# Patient Record
Sex: Male | Born: 1950 | Race: White | Hispanic: No | Marital: Married | Smoking: Former smoker
Health system: Southern US, Community
[De-identification: ages and names within clinical notes are randomized; demographics above are authoritative.]

## PROBLEM LIST (undated history)

## (undated) DIAGNOSIS — G4730 Sleep apnea, unspecified: Secondary | ICD-10-CM

## (undated) DIAGNOSIS — E669 Obesity, unspecified: Secondary | ICD-10-CM

## (undated) DIAGNOSIS — G459 Transient cerebral ischemic attack, unspecified: Secondary | ICD-10-CM

## (undated) DIAGNOSIS — M1990 Unspecified osteoarthritis, unspecified site: Secondary | ICD-10-CM

## (undated) DIAGNOSIS — Z9289 Personal history of other medical treatment: Secondary | ICD-10-CM

## (undated) DIAGNOSIS — I2510 Atherosclerotic heart disease of native coronary artery without angina pectoris: Secondary | ICD-10-CM

## (undated) DIAGNOSIS — F419 Anxiety disorder, unspecified: Secondary | ICD-10-CM

## (undated) DIAGNOSIS — E785 Hyperlipidemia, unspecified: Secondary | ICD-10-CM

## (undated) DIAGNOSIS — I639 Cerebral infarction, unspecified: Secondary | ICD-10-CM

## (undated) DIAGNOSIS — J189 Pneumonia, unspecified organism: Secondary | ICD-10-CM

## (undated) DIAGNOSIS — I10 Essential (primary) hypertension: Secondary | ICD-10-CM

## (undated) DIAGNOSIS — Z8719 Personal history of other diseases of the digestive system: Secondary | ICD-10-CM

## (undated) DIAGNOSIS — K219 Gastro-esophageal reflux disease without esophagitis: Secondary | ICD-10-CM

## (undated) HISTORY — PX: FOOT SURGERY: SHX648

## (undated) HISTORY — DX: Gastro-esophageal reflux disease without esophagitis: K21.9

## (undated) HISTORY — PX: OTHER SURGICAL HISTORY: SHX169

## (undated) HISTORY — DX: Sleep apnea, unspecified: G47.30

## (undated) HISTORY — DX: Obesity, unspecified: E66.9

## (undated) HISTORY — DX: Cerebral infarction, unspecified: I63.9

## (undated) HISTORY — PX: BUNIONECTOMY: SHX129

## (undated) HISTORY — PX: TONSILLECTOMY: SUR1361

## (undated) HISTORY — DX: Hyperlipidemia, unspecified: E78.5

## (undated) HISTORY — DX: Transient cerebral ischemic attack, unspecified: G45.9

## (undated) HISTORY — DX: Essential (primary) hypertension: I10

## (undated) HISTORY — DX: Personal history of other medical treatment: Z92.89

## (undated) HISTORY — PX: KNEE SURGERY: SHX244

---

## 2004-02-09 ENCOUNTER — Ambulatory Visit (HOSPITAL_COMMUNITY): Admission: RE | Admit: 2004-02-09 | Discharge: 2004-02-09 | Payer: Self-pay | Admitting: Family Medicine

## 2004-02-21 ENCOUNTER — Ambulatory Visit (HOSPITAL_COMMUNITY): Admission: RE | Admit: 2004-02-21 | Discharge: 2004-02-21 | Payer: Self-pay | Admitting: Family Medicine

## 2005-05-17 ENCOUNTER — Ambulatory Visit (HOSPITAL_COMMUNITY): Admission: RE | Admit: 2005-05-17 | Discharge: 2005-05-17 | Payer: Self-pay | Admitting: Family Medicine

## 2008-04-20 LAB — TSH: TSH: 2.42 u[IU]/mL (ref <=5.90)

## 2008-12-02 DIAGNOSIS — I639 Cerebral infarction, unspecified: Secondary | ICD-10-CM

## 2008-12-02 DIAGNOSIS — Z8673 Personal history of transient ischemic attack (TIA), and cerebral infarction without residual deficits: Secondary | ICD-10-CM | POA: Insufficient documentation

## 2008-12-02 HISTORY — DX: Cerebral infarction, unspecified: I63.9

## 2009-04-18 LAB — LIPID PANEL
Cholesterol: 186 mg/dL (ref 0–200)
HDL: 36 mg/dL (ref 35–70)
LDL Cholesterol: 99 mg/dL
Triglycerides: 252 mg/dL — AB (ref 40–160)

## 2009-12-02 HISTORY — PX: COLONOSCOPY: SHX174

## 2010-01-23 ENCOUNTER — Encounter: Admission: RE | Admit: 2010-01-23 | Discharge: 2010-01-23 | Payer: Self-pay | Admitting: Internal Medicine

## 2010-04-04 ENCOUNTER — Encounter (INDEPENDENT_AMBULATORY_CARE_PROVIDER_SITE_OTHER): Payer: Self-pay

## 2010-04-06 ENCOUNTER — Ambulatory Visit: Payer: Self-pay | Admitting: Internal Medicine

## 2010-04-20 ENCOUNTER — Ambulatory Visit: Payer: Self-pay | Admitting: Internal Medicine

## 2010-04-24 ENCOUNTER — Encounter: Payer: Self-pay | Admitting: Internal Medicine

## 2010-08-24 ENCOUNTER — Ambulatory Visit (HOSPITAL_BASED_OUTPATIENT_CLINIC_OR_DEPARTMENT_OTHER): Admission: RE | Admit: 2010-08-24 | Discharge: 2010-08-24 | Payer: Self-pay | Admitting: Urology

## 2010-09-27 ENCOUNTER — Ambulatory Visit: Payer: Self-pay | Admitting: Cardiovascular Disease

## 2010-10-10 ENCOUNTER — Telehealth (INDEPENDENT_AMBULATORY_CARE_PROVIDER_SITE_OTHER): Payer: Self-pay | Admitting: *Deleted

## 2010-10-11 ENCOUNTER — Encounter: Payer: Self-pay | Admitting: Cardiology

## 2010-10-11 ENCOUNTER — Ambulatory Visit (HOSPITAL_COMMUNITY): Admission: RE | Admit: 2010-10-11 | Discharge: 2010-10-11 | Payer: Self-pay | Admitting: Cardiovascular Disease

## 2010-10-11 ENCOUNTER — Ambulatory Visit: Payer: Self-pay

## 2010-10-11 ENCOUNTER — Encounter: Payer: Self-pay | Admitting: Cardiovascular Disease

## 2010-10-11 ENCOUNTER — Ambulatory Visit: Payer: Self-pay | Admitting: Cardiology

## 2010-10-11 ENCOUNTER — Encounter (HOSPITAL_COMMUNITY)
Admission: RE | Admit: 2010-10-11 | Discharge: 2010-12-01 | Payer: Self-pay | Source: Home / Self Care | Attending: Cardiovascular Disease | Admitting: Cardiovascular Disease

## 2010-10-15 ENCOUNTER — Ambulatory Visit: Payer: Self-pay

## 2010-12-19 ENCOUNTER — Encounter: Payer: Self-pay | Admitting: Cardiovascular Disease

## 2010-12-19 DIAGNOSIS — E669 Obesity, unspecified: Secondary | ICD-10-CM | POA: Insufficient documentation

## 2010-12-19 DIAGNOSIS — I10 Essential (primary) hypertension: Secondary | ICD-10-CM | POA: Insufficient documentation

## 2010-12-19 DIAGNOSIS — E785 Hyperlipidemia, unspecified: Secondary | ICD-10-CM | POA: Insufficient documentation

## 2010-12-19 DIAGNOSIS — K219 Gastro-esophageal reflux disease without esophagitis: Secondary | ICD-10-CM | POA: Insufficient documentation

## 2010-12-19 DIAGNOSIS — G4733 Obstructive sleep apnea (adult) (pediatric): Secondary | ICD-10-CM | POA: Insufficient documentation

## 2010-12-19 DIAGNOSIS — E119 Type 2 diabetes mellitus without complications: Secondary | ICD-10-CM | POA: Insufficient documentation

## 2010-12-23 ENCOUNTER — Encounter: Payer: Self-pay | Admitting: Family Medicine

## 2011-01-01 NOTE — Letter (Signed)
Summary: [facility] Instructions  [facility]  [address] [place], [place] [address]   Phone: [phone]  Fax: [phone]       Matthew Love    May 16, 1951    MRN: [id number]        Procedure Day Dorna Bloom:  Farrell Ours  04/20/10     Arrival Time:  7:30AM     Procedure Time:  8:30AM     Location of Procedure:                    _X _  [facility] (4th Floor)                        PREPARATION FOR COLONOSCOPY WITH MOVIPREP   Starting 5 days prior to your procedure 04/15/10 do not eat nuts, seeds, popcorn, corn, beans, peas,  salads, or any raw vegetables.  Do not take any fiber supplements (e.g. Metamucil, Citrucel, and Benefiber).  THE DAY BEFORE YOUR PROCEDURE         DATE: 04/19/10  DAY: THURSDAY  1.  Drink clear liquids the entire day-NO SOLID FOOD  2.  Do not drink anything colored red or purple.  Avoid juices with pulp.  No orange juice.  3.  Drink at least 64 oz. (8 glasses) of fluid/clear liquids during the day to prevent dehydration and help the prep work efficiently.  CLEAR LIQUIDS INCLUDE: Water Jello Ice Popsicles Tea (sugar ok, no milk/cream) Powdered fruit flavored drinks Coffee (sugar ok, no milk/cream) Gatorade Juice: apple, white grape, white cranberry  Lemonade Clear bullion, consomm, broth Carbonated beverages (any kind) Strained chicken noodle soup Hard Candy                             4.  In the morning, mix first dose of MoviPrep solution:    Empty 1 Pouch A and 1 Pouch B into the disposable container    Add lukewarm drinking water to the top line of the container. Mix to dissolve    Refrigerate (mixed solution should be used within 24 hrs)  5.  Begin drinking the prep at 5:00 p.m. The MoviPrep container is divided by 4 marks.   Every 15 minutes drink the solution down to the next mark (approximately 8 oz) until the full liter is complete.   6.  Follow completed prep with 16 oz of clear liquid of your choice  (Nothing red or purple).  Continue to drink clear liquids until bedtime.  7.  Before going to bed, mix second dose of MoviPrep solution:    Empty 1 Pouch A and 1 Pouch B into the disposable container    Add lukewarm drinking water to the top line of the container. Mix to dissolve    Refrigerate  THE DAY OF YOUR PROCEDURE      DATE: 04/20/10  DAY: FRIDAY  Beginning at 3:30AM (5 hours before procedure):         1. Every 15 minutes, drink the solution down to the next mark (approx 8 oz) until the full liter is complete.  2. Follow completed prep with 16 oz. of clear liquid of your choice.    3. You may drink clear liquids until 6:30AM (2 HOURS BEFORE PROCEDURE).   MEDICATION INSTRUCTIONS  Unless otherwise instructed, you should take regular prescription medications with a small sip of water   as early as possible the morning  of your procedure.   Additional medication instructions: Do not take diuretic am of procedure         OTHER INSTRUCTIONS  You will need a responsible adult at least 61 years of age to accompany you and drive you home.   This person must remain in the waiting room during your procedure.  Wear loose fitting clothing that is easily removed.  Leave jewelry and other valuables at home.  However, you may wish to bring a book to read or  an iPod/MP3 player to listen to music as you wait for your procedure to start.  Remove all body piercing jewelry and leave at home.  Total time from sign-in until discharge is approximately 2-3 hours.  You should go home directly after your procedure and rest.  You can resume normal activities the  day after your procedure.  The day of your procedure you should not:   Drive   Make legal decisions   Operate machinery   Drink alcohol   Return to work  You will receive specific instructions about eating, activities and medications before you leave.    The above instructions have been reviewed and explained to  me by   Ulis Rias RN  Apr 06, 2010 8:48 AM     I fully understand and can verbalize these instructions _____________________________ Date _________

## 2011-01-01 NOTE — Miscellaneous (Signed)
Summary: Lec previsit  Clinical Lists Changes  Medications: Added new medication of MOVIPREP 100 GM  SOLR (PEG-KCL-NACL-NASULF-NA ASC-C) As per prep instructions. - Signed Rx of MOVIPREP 100 GM  SOLR (PEG-KCL-NACL-NASULF-NA ASC-C) As per prep instructions.;  #1 x 0;  Signed;  Entered by: Ulis Rias RN;  Authorized by: Hilarie Fredrickson MD;  Method used: Electronically to [facility] [address], [place], [place], [place]  [address], Ph: [phone], Fax: [phone] Allergies: Added new allergy or adverse reaction of VICODIN Observations: Added new observation of NKA: F (04/06/2010 8:27)    Prescriptions: MOVIPREP 100 GM  SOLR (PEG-KCL-NACL-NASULF-NA ASC-C) As per prep instructions.  #1 x 0   Entered by:   Ulis Rias RN   Authorized by:   Hilarie Fredrickson MD   Signed by:   Ulis Rias RN on 04/06/2010   Method used:   Electronically to        [facility]  [address] (retail)       [address]       [place], [place]  [address]       Ph: [phone]       Fax: [phone]   RxID:   [id number]

## 2011-01-01 NOTE — Progress Notes (Signed)
Summary: Nuc Pre-Procedure  Phone Note Outgoing Call Call back at [facility] Phone [phone]   Call placed by: Antionette Char RN,  October 10, 2010 3:22 PM Call placed to: Patient Reason for Call: Confirm/change Appt Summary of Call: Reviewed information on Myoview Information Sheet (see scanned document for further details).  Spoke with patient.     Nuclear Med Background Indications for Stress Test: Evaluation for Ischemia   History: Echo, Myocardial Perfusion Study  History Comments: 2004 MPS- NML  Symptoms: Fatigue, SOB    Nuclear Pre-Procedure Cardiac Risk Factors: History of Smoking, Hypertension, NIDDM

## 2011-01-01 NOTE — Assessment & Plan Note (Signed)
Summary: Cardiology Nuclear Testing  Nuclear Med Background Indications for Stress Test: Evaluation for Ischemia   History: Echo, Myocardial Perfusion Study  History Comments: 2004 MPS- NML '08 Echo: NL EF.  Symptoms: DOE, Fatigue, Fatigue with Exertion, SOB    Nuclear Pre-Procedure Cardiac Risk Factors: History of Smoking, Hypertension, Lipids, NIDDM, Obesity, TIA Caffeine/Decaff Intake: None NPO After: 10:00 PM IV 0.9% NS with Angio Cath: 20g     IV Site: R Hand IV Started by: Irean Hong, RN Chest Size (in) 54     Height (in): 72.5 Weight (lb): 295 BMI: 39.60  Nuclear Med Study 1 or 2 day study:  2 day     Stress Test Type:  Stress Reading MD:  Olga Millers, MD     Referring MD:  Kristeen Miss Resting Radionuclide:  Technetium 15m Tetrofosmin     Resting Radionuclide Dose:  33 mCi  Stress Radionuclide:  Technetium 69m Tetrofosmin     Stress Radionuclide Dose:  33 mCi   Stress Protocol Exercise Time (min):  7:15 min     Max HR:  153 bpm     Predicted Max HR:  161 bpm  Max Systolic BP: 228 mm Hg     Percent Max HR:  95.03 %     METS: 8.8 Rate Pressure Product:  [address]    Stress Test Technologist:  Irean Hong,  RN     Nuclear Technologist:  Domenic Polite, CNMT  Rest Procedure  Myocardial perfusion imaging was performed at rest 45 minutes following the intravenous administration of Technetium 31m Tetrofosmin.  Stress Procedure  The patient exercised for 7 minutes and 15 seconds, RPE=15.   The patient stopped due to DOE  and denied any chest pain.  There were no significant ST-T wave changes, frequent PVC's, and  7 beats PSVT immediately in recovery. The patient had a hypertensive response to exercise.  Technetium 22m Tetrofosmin was injected at peak exercise and myocardial perfusion imaging was performed after a brief delay.  QPS Raw Data Images:  Mild diaphragmatic attenuation.  Normal left ventricular size. Stress Images:  Decreased radiotracer counts in the  inferior wall.  Rest Images:  Decreased radiotracer counts in the inferior wall.  Subtraction (SDS):  Fixed inferior perfusion defect.  Transient Ischemic Dilatation:  0.93  (Normal <1.22)  Lung/Heart Ratio:  0.23  (Normal <0.45)  Quantitative Gated Spect Images QGS EDV:  77 ml QGS ESV:  24 ml QGS EF:  69 % QGS cine images:  Normal wall motion.    Overall Impression  Exercise Capacity: Fair exercise capacity. BP Response: Hypertensive blood pressure response. Clinical Symptoms: Short of breath, no chest pain.  ECG Impression: No significant ST segment change suggestive of ischemia. Short run SVT during exercise.  Overall Impression: Fixed inferior perfusion defect with no evidence for ischemia.  Low risk study.  Overall Impression Comments: There is significant soft tissue attenuation on planar images.  Suspect inferior fixed defect is due to diaphragmatic attenuation given normal wall motion.   Appended Document: Cardiology Nuclear Testing copy sent to Dr.Nasher

## 2011-01-01 NOTE — Procedures (Signed)
Summary: Colonoscopy  Patient: Matthew Love Note: All result statuses are Final unless otherwise noted.  Tests: (1) Colonoscopy (COL)   COL Colonoscopy           DONE     [facility]     [address]     [place], [place]  [address]           COLONOSCOPY PROCEDURE REPORT           PATIENT:  [name], [name]  MR#:  [id number]     BIRTHDATE:  04-May-1951, 58 yrs. old  GENDER:  male     ENDOSCOPIST:  Wilhemina Bonito. Eda Keys, MD     REF. BY:  Screening / Recall     PROCEDURE DATE:  04/20/2010     PROCEDURE:  Colonoscopy with snare polypectomy     ASA CLASS:  Class II     INDICATIONS:  Routine Risk Screening ;negative exam 2004     MEDICATIONS:   Fentanyl 75 mcg IV, Versed 8 mg IV           DESCRIPTION OF PROCEDURE:   After the risks benefits and     alternatives of the procedure were thoroughly explained, informed     consent was obtained.  Digital rectal exam was performed and     revealed no abnormalities.   The [id number] [id number] endoscope was     introduced through the anus and advanced to the cecum, which was     identified by both the appendix and ileocecal valve, without     limitations.Time to cecum = 2:40 min.The quality of the prep was     good, using MoviPrep.  The instrument was then slowly withdrawn     (time = 13:20 min) as the colon was fully examined.     <<PROCEDUREIMAGES>>           FINDINGS:  A 6mm sessile polyp was found in the cecum. Polyp was     snared without cautery. Retrieval was successful.  Moderate     diverticulosis was found in the sigmoid colon.  This was otherwise     a normal examination of the colon.   Retroflexed views in the     rectum revealed internal hemorrhoids.    The scope was then     withdrawn from the patient and the procedure completed.           COMPLICATIONS:  None     ENDOSCOPIC IMPRESSION:     1) Sessile polyp in the cecum - removed     2) Moderate diverticulosis in the sigmoid colon     3) Otherwise normal examination     4)  Internal hemorrhoids           RECOMMENDATIONS:     1) Follow up colonoscopy in 5 years           ______________________________     Wilhemina Bonito. Eda Keys, MD           CC:  Geoffry Paradise, MD; The Patient           n.     eSIGNED:   Wilhemina Bonito. Eda Keys at 04/20/2010 09:21 AM           Malena Catholic, [id number]  Note: An exclamation mark (!) indicates a result that was not dispersed into the flowsheet. Document Creation Date: 04/20/2010 1:52 PM _______________________________________________________________________  (1) Order result status: Final Collection or observation date-time: 04/20/2010 09:13 Requested date-time:  Receipt date-time:  Reported date-time:  Referring Physician:   Ordering Physician: Fransico Setters [phone]) Specimen Source:  Source: Launa Grill Order Number: [id number] Lab site:   Appended Document: Colonoscopy recall     Procedures Next Due Date:    Colonoscopy: 04/2015

## 2011-01-01 NOTE — Letter (Signed)
Summary: Patient Notice- Polyp Results  [facility]  [address] [place], [place] [address]   Phone: [phone]  Fax: [phone]        Apr 24, 2010 MRN: [id number]    TEE RICHESON [address] [place], [place]  [address]    Dear Mr. BRINE,  I am pleased to inform you that the colon polyp(s) removed during your recent colonoscopy was (were) found to be benign (no cancer detected) upon pathologic examination.  I recommend you have a repeat colonoscopy examination in 5 years to look for recurrent polyps, as having colon polyps increases your risk for having recurrent polyps or even colon cancer in the future.  Should you develop new or worsening symptoms of abdominal pain, bowel habit changes or bleeding from the rectum or bowels, please schedule an evaluation with either your primary care physician or with me.  Additional information/recommendations:  __ No further action with gastroenterology is needed at this time. Please      follow-up with your primary care physician for your other healthcare      needs.   Please call us if you are having persistent problems or have questions about your condition that have not been fully answered at this time.  Sincerely,  Hilarie Fredrickson MD  This letter has been electronically signed by your physician.  Appended Document: Patient Notice- Polyp Results letter mailed.

## 2011-01-09 ENCOUNTER — Ambulatory Visit: Payer: Self-pay | Admitting: Cardiovascular Disease

## 2011-02-14 LAB — POCT I-STAT 4, (NA,K, GLUC, HGB,HCT)
Potassium: 3.6 mEq/L (ref 3.5–5.1)
Sodium: 143 mEq/L (ref 135–145)

## 2011-04-05 ENCOUNTER — Encounter: Payer: Self-pay | Admitting: Cardiovascular Disease

## 2011-04-24 ENCOUNTER — Telehealth: Payer: Self-pay | Admitting: *Deleted

## 2011-04-24 NOTE — Telephone Encounter (Signed)
Try Losartan 100 mg daily.  He will need to work on diet , exercise and weight loss.  Losartan is a weaker BP medication.  Check BMP in 3-4 weeks after changing

## 2011-04-24 NOTE — Telephone Encounter (Signed)
Wife called back with name of new drug possibility, they will call back if they want to change. i called [facility] and it would depend on the pts ins on wether or not it would be cheaper. Wife given prices and med names and will see if Adriel will change med, pt to call back if wants to change.Alfonso Ramus RN

## 2011-04-24 NOTE — Telephone Encounter (Signed)
diovan is costly, can you suggest something comparable but less costly. i will phone pt back, Alfonso Ramus RN

## 2011-09-25 ENCOUNTER — Other Ambulatory Visit: Payer: Self-pay | Admitting: Cardiovascular Disease

## 2011-09-25 MED ORDER — TRIAMTERENE-HCTZ 37.5-25 MG PO TABS: 1.0000 | ORAL_TABLET | Freq: Every day | ORAL | Status: DC

## 2011-09-25 NOTE — Telephone Encounter (Signed)
Fax Received. Refill Completed. Matthew Love (M.A). Pt needs appointment then refill can be made  

## 2011-10-21 ENCOUNTER — Other Ambulatory Visit: Payer: Self-pay | Admitting: Cardiovascular Disease

## 2012-05-15 ENCOUNTER — Ambulatory Visit: Payer: Self-pay | Admitting: Nurse Practitioner

## 2012-07-28 ENCOUNTER — Ambulatory Visit (INDEPENDENT_AMBULATORY_CARE_PROVIDER_SITE_OTHER): Payer: PRIVATE HEALTH INSURANCE | Admitting: Physician Assistant

## 2012-07-28 ENCOUNTER — Ambulatory Visit (INDEPENDENT_AMBULATORY_CARE_PROVIDER_SITE_OTHER)
Admission: RE | Admit: 2012-07-28 | Discharge: 2012-07-28 | Disposition: A | Discharge status: Home / Self Care | Payer: PRIVATE HEALTH INSURANCE | Source: Ambulatory Visit | Attending: Physician Assistant | Admitting: Physician Assistant

## 2012-07-28 ENCOUNTER — Encounter: Payer: Self-pay | Admitting: Physician Assistant

## 2012-07-28 VITALS — BP 157/87 | HR 67 | Resp 18 | Ht 74.0 in | Wt 291.0 lb

## 2012-07-28 DIAGNOSIS — R079 Chest pain, unspecified: Secondary | ICD-10-CM

## 2012-07-28 DIAGNOSIS — I10 Essential (primary) hypertension: Secondary | ICD-10-CM

## 2012-07-28 DIAGNOSIS — K219 Gastro-esophageal reflux disease without esophagitis: Secondary | ICD-10-CM

## 2012-07-28 NOTE — Progress Notes (Signed)
[address] [place], [place]  [address] Phone: [phone] Fax:  [phone]  Date:  07/28/2012   Name:  Matthew Love   DOB:  [birth year]   MRN:  [id number]  PCP:  Minda Meo, MD  Primary Cardiologist:  Dr. Delane Ginger  Primary Electrophysiologist:  None    History of Present Illness: Matthew Love is a 61 y.o. male who returns for evaluation of chest pain.  He has a history of HTN, DM2, HL, GERD and sleep apnea. He is noncompliant with CPAP.   Echo 11/11: EF 60-65%, grade 1 diastolic dysfunction, mild LAE.  ETT-Myoview 11/11: Inferior fixed defect likely due to diaphragmatic attenuation with normal wall motion, EF 69%.   Over the last 2-3 months, he has noted worsening left-sided chest pain. He can point to it with one finger. It feels dull. He denies any exertional chest pain. He notes it only at rest. He denies any radiating symptoms or associated nausea, diaphoresis. He denies associated shortness of breath. He does have dyspnea with more extreme activities. He denies orthopnea, PND or significant pedal edema. He has had a cough over last several months. This is productive of clear sputum. He was prescribed a Z-Pak at one point with his primary care provider. He denies any recent travels, injuries to his lower extremities or recent hospitalizations.  He denies pleuritic chest pain or chest pain with lying supine.  Wt Readings from Last 3 Encounters:  07/28/12 291 lb (131.997 kg)  10/11/10 295 lb (133.811 kg)     Past Medical History  Diagnosis Date  . Hypertension   . GERD (gastroesophageal reflux disease)   . Diabetes mellitus   . Sleep apnea   . Obesity   . Hyperlipidemia     Current Outpatient Prescriptions  Medication Sig Dispense Refill  . aspirin 81 MG tablet Take 81 mg by mouth daily.        Randa Ngo 30 MG/ACT SOLN 2 applicators as directed.       [name] DIOVAN 160 MG tablet TAKE ONE TABLET BY MOUTH EVERY DAY **NEED TO  SCHEDULE MD APPOINTMENT FOR MORE REFILLS**  15 each  0  . esomeprazole (NEXIUM) 40 MG capsule Take 40 mg by mouth daily.        . fexofenadine (ALLEGRA) 180 MG tablet Take 180 mg by mouth as needed.        . Multiple Vitamin (MULTIVITAMIN) tablet Take 1 tablet by mouth daily.        . OMEGA 3 1200 MG CAPS Take by mouth 4 (four) times daily.        [name] triamterene-hydrochlorothiazide (MAXZIDE-25) 37.5-25 MG per tablet Take 1 each (1 tablet total) by mouth daily.  30 tablet  0  . Vardenafil HCl (LEVITRA PO) Take by mouth as needed.          Allergies: Allergies  Allergen Reactions  . Hydrocodone-Acetaminophen     REACTION: rash    History  Substance Use Topics  . Smoking status: Former Smoker -- 3.5 packs/day for 20 years  . Smokeless tobacco: Not on file  . Alcohol Use: No     ROS:  Please see the history of present illness.     All other systems reviewed and negative.   PHYSICAL EXAM: VS:  BP 157/87  Pulse 67  Resp 18  Ht 6\' 2"  (1.88 m)  Wt 291 lb (131.997 kg)  BMI 37.36 kg/m2  SpO2 97% Repeat blood pressure by  me 120/80  Well nourished, well developed, in no acute distress HEENT: normal Neck: no JVD Vascular: No carotid bruits Endocrine: No thyromegaly Cardiac:  normal S1, S2; RRR; no murmur Lungs:  clear to auscultation bilaterally, no wheezing, rhonchi or rales Abd: soft, nontender, no hepatomegaly Ext: no edema Skin: warm and dry Neuro:  CNs 2-12 intact, no focal abnormalities noted  EKG:  Sinus rhythm, heart rate 68, left axis, nonspecific ST-T wave changes, no change from prior tracings      ASSESSMENT AND PLAN:  1. Chest Pain Atypical symptoms. He does have significant cardiac risk factors. It has been more than 18 months since his last assessment for ischemia. I will set him up for an ETT-Myoview. He is a prior smoker and has a recent cough. I will also check a chest x-ray. He already has an appointment next month with Dr. Elease Hashimoto. He can keep that  appointment.  2. Hypertension Controlled.  Continue current therapy.   3. GERD I have asked him to take his Nexium twice a day for a week to see if this helps his symptoms.  Signed, Tereso Newcomer, PA-C  1:34 PM 07/28/2012

## 2012-07-28 NOTE — Patient Instructions (Signed)
Take your Nexium twice a day (30 minutes before a meal) for one week. Keep follow up with Dr. Delane Ginger on 08/18/2012 as planned.

## 2012-07-29 ENCOUNTER — Telehealth: Payer: Self-pay | Admitting: *Deleted

## 2012-07-29 NOTE — Telephone Encounter (Signed)
Message copied by Tarri Fuller on Wed Jul 29, 2012  4:59 PM ------      Message from: [place], [place] T      Created: Tue Jul 28, 2012  5:08 PM       Nothing acute on chest X-ray      There are changes that may be from prior smoking - not cause of his chest pain.      Fax chest X-ray results to PCP and have Jerrye Bushy follow up with ARONSON,RICHARD A, MD (not urgent).      Tereso Newcomer, PA-C  5:08 PM 07/28/2012

## 2012-07-29 NOTE — Telephone Encounter (Signed)
pt notified about cxr and gave me verbal understanding today, will fax cxr to PCP, pt aware to see PCP about cxr though not urgent, pt said ok

## 2012-08-06 ENCOUNTER — Ambulatory Visit (HOSPITAL_COMMUNITY): Payer: PRIVATE HEALTH INSURANCE | Attending: Cardiology | Admitting: Radiology

## 2012-08-06 VITALS — BP 128/78 | Ht 74.0 in | Wt 291.0 lb

## 2012-08-06 DIAGNOSIS — Z87891 Personal history of nicotine dependence: Secondary | ICD-10-CM | POA: Insufficient documentation

## 2012-08-06 DIAGNOSIS — E669 Obesity, unspecified: Secondary | ICD-10-CM | POA: Insufficient documentation

## 2012-08-06 DIAGNOSIS — E119 Type 2 diabetes mellitus without complications: Secondary | ICD-10-CM | POA: Insufficient documentation

## 2012-08-06 DIAGNOSIS — Z8249 Family history of ischemic heart disease and other diseases of the circulatory system: Secondary | ICD-10-CM | POA: Insufficient documentation

## 2012-08-06 DIAGNOSIS — R0609 Other forms of dyspnea: Secondary | ICD-10-CM | POA: Insufficient documentation

## 2012-08-06 DIAGNOSIS — I10 Essential (primary) hypertension: Secondary | ICD-10-CM | POA: Insufficient documentation

## 2012-08-06 DIAGNOSIS — R0989 Other specified symptoms and signs involving the circulatory and respiratory systems: Secondary | ICD-10-CM | POA: Insufficient documentation

## 2012-08-06 DIAGNOSIS — R079 Chest pain, unspecified: Secondary | ICD-10-CM | POA: Insufficient documentation

## 2012-08-06 DIAGNOSIS — R0602 Shortness of breath: Secondary | ICD-10-CM

## 2012-08-06 MED ORDER — TECHNETIUM TC 99M TETROFOSMIN IV KIT
30.0000 | PACK | Freq: Once | INTRAVENOUS | Status: AC | PRN
  Administered 2012-08-06: 30 via INTRAVENOUS

## 2012-08-06 NOTE — Progress Notes (Signed)
[facility] SITE 3 NUCLEAR MED [address] [place] [address] [phone]  Cardiology Nuclear Med Study  Matthew Love is a 61 y.o. male     MRN : [id number]     DOB: 09-18-1951  Procedure Date: 08/06/2012  Nuclear Med Background Indication for Stress Test:  Evaluation for Ischemia History:  No prior known history of CAD, 10-2010 Myocardial Perfusion Study-(-) ischemia, EF=89%, and 10-2010 Echo: EF=60-65% Cardiac Risk Factors: Family History - CAD, History of Smoking, Hypertension, Lipids, NIDDM, Obesity and TIA  Symptoms: Chest Pain without exertion (last occurrence few days ago), DOE, Fatigue and Fatigue with Exertion   Nuclear Pre-Procedure Caffeine/Decaff Intake:  7:00pm NPO After: 7:30am   Lungs:  clear O2 Sat: 97% on room air. IV 0.9% NS with Angio Cath:  20g  IV Site: R Hand  IV Started by:  Matthew Love, CNMT  Chest Size (in):  50in  Cup Size: n/a  Height: 6\' 2"  (1.88 m)  Weight:  291 lb (131.997 kg)  BMI:  Body mass index is 37.36 kg/(m^2). Tech Comments:  Took all meds as directed    Nuclear Med Study 1 or 2 day study: 2 day  Stress Test Type:  Stress  Reading MD: Matthew Love,M.D. Order Authorizing Provider:  Katherina Right, MD, and Tereso Love, [facility]  Resting Radionuclide: Technetium 49m Tetrofosmin  Resting Radionuclide Dose: 33.0 mCi       On   08-10-12                                  Stress Radionuclide:  Technetium 62m Tetrofosmin  Stress Radionuclide Dose: 33.0 mCi                                       On 08-06-12          Stress Protocol Rest HR: 66 Stress HR: 142  Rest BP: 128/78 Stress BP: 228/105  Exercise Time (min): 7:45 METS: 9.3   Predicted Max HR: 159 bpm % Max HR: 89.31 bpm Rate Pressure Product: 284[address]   Dose of Adenosine (mg):  n/a Dose of Lexiscan: n/a mg  Dose of Atropine (mg): n/a Dose of Dobutamine: n/a mcg/kg/min (at max HR)  Stress Test Technologist: Matthew Hong, RN  Nuclear Technologist:  Matthew Love, CNMT      Rest Procedure:  Myocardial perfusion imaging was performed at rest 45 minutes following the intravenous administration of Technetium 40m Tetrofosmin. Rest ECG: NSR - Normal EKG  Stress Procedure:  The patient performed treadmill exercise using a Bruce  Protocol for 7 minutes and 45 seconds, RPE=17 minutes. The patient stopped due to DOE and denied any chest pain.  There were no significant ST-T wave changes. There was a hypertensive response to exercise. There were occasional PVC's, PAC's and bigeminy x 1. Technetium 55m Tetrofosmin was injected at peak exercise and myocardial perfusion imaging was performed after a brief delay. Stress ECG: No significant change from baseline ECG  QPS Raw Data Images:  Images were motion corrected.  Soft tissue (diaphragm, bowel, subcutaneous fat) surround heart. Stress Images:  Normal perfusion with minimal apical thinning. Rest Images:  Comparison with the stress images reveals no significant change. Subtraction (SDS):  No evidence of ischemia. Transient Ischemic Dilatation (Normal <1.22):  0.78 Lung/Heart Ratio (Normal <0.45):  0.40  Quantitative Gated  Spect Images QGS EDV:  80 ml QGS ESV:  26 ml  Impression Exercise Capacity:  Good exercise capacity. BP Response:  Hypertensive blood pressure response. Clinical Symptoms:  No chest pain. ECG Impression:  No significant ST segment change suggestive of ischemia. Comparison with Prior Nuclear Study: No change from previous report.  Overall Impression:  Normal stress nuclear study.  LV Ejection Fraction: 68%.  LV Wall Motion:  NL LV Function; NL Wall Motion  Matthew Love

## 2012-08-07 NOTE — Addendum Note (Signed)
Addended by: Lacie Scotts on: 08/07/2012 04:03 PM   Modules accepted: Orders

## 2012-08-10 ENCOUNTER — Ambulatory Visit (HOSPITAL_COMMUNITY): Payer: PRIVATE HEALTH INSURANCE | Attending: Internal Medicine | Admitting: Radiology

## 2012-08-10 DIAGNOSIS — R0989 Other specified symptoms and signs involving the circulatory and respiratory systems: Secondary | ICD-10-CM

## 2012-08-10 MED ORDER — TECHNETIUM TC 99M TETROFOSMIN IV KIT
30.0000 | PACK | Freq: Once | INTRAVENOUS | Status: AC | PRN
  Administered 2012-08-10: 30 via INTRAVENOUS

## 2012-08-11 ENCOUNTER — Telehealth: Payer: Self-pay | Admitting: *Deleted

## 2012-08-11 NOTE — Telephone Encounter (Signed)
Message copied by Tarri Fuller on Tue Aug 11, 2012  5:21 PM ------      Message from: [place], [place] T      Created: Tue Aug 11, 2012  2:08 PM       Please inform patient stress test normal.      Tereso Newcomer, PA-C  2:08 PM 08/11/2012

## 2012-08-11 NOTE — Telephone Encounter (Signed)
pt notified about stress test results w/verbal understanding today and pt asked about the CXR results and I told him that I reviewed it w/him 8/28 but he sdaid he could remember everything, reviewed again pt understands

## 2012-08-18 ENCOUNTER — Ambulatory Visit (INDEPENDENT_AMBULATORY_CARE_PROVIDER_SITE_OTHER): Payer: PRIVATE HEALTH INSURANCE | Admitting: Cardiovascular Disease

## 2012-08-18 ENCOUNTER — Encounter: Payer: Self-pay | Admitting: Cardiovascular Disease

## 2012-08-18 VITALS — BP 130/76 | HR 64 | Ht 74.0 in | Wt 296.0 lb

## 2012-08-18 DIAGNOSIS — R0789 Other chest pain: Secondary | ICD-10-CM

## 2012-08-18 DIAGNOSIS — E785 Hyperlipidemia, unspecified: Secondary | ICD-10-CM

## 2012-08-18 NOTE — Patient Instructions (Addendum)
Your physician wants you to follow-up in: 12 months You will receive a reminder letter in the mail two months in advance. If you don't receive a letter, please call our office to schedule the follow-up appointment.  Your physician recommends that you return for a FASTING lipid profile: 12 months   Calorie Counting Diet A calorie counting diet requires you to eat the number of calories that are right for you in a day. Calories are the measurement of how much energy you get from the food you eat. Eating the right amount of calories is important for staying at a healthy weight. If you eat too many calories, your body will store them as fat and you may gain weight. If you eat too few calories, you may lose weight. Counting the number of calories you eat during a day will help you know if you are eating the right amount. A Registered Dietitian can determine how many calories you need in a day. The amount of calories needed varies from person to person. If your goal is to lose weight, you will need to eat fewer calories. Losing weight can benefit you if you are overweight or have health problems such as heart disease, high blood pressure, or diabetes. If your goal is to gain weight, you will need to eat more calories. Gaining weight may be necessary if you have a certain health problem that causes your body to need more energy. TIPS Whether you are increasing or decreasing the number of calories you eat during a day, it may be hard to get used to changes in what you eat and drink. The following are tips to help you keep track of the number of calories you eat.  Measure foods at home with measuring cups. This helps you know the amount of food and number of calories you are eating.   Restaurants often serve food in amounts that are larger than 1 serving. While eating out, estimate how many servings of a food you are given. For example, a serving of cooked rice is  cup or about the size of half of a fist.  Knowing serving sizes will help you be aware of how much food you are eating at restaurants.   Ask for smaller portion sizes or child-size portions at restaurants.   Plan to eat half of a meal at a restaurant. Take the rest home or share the other half with a friend.   Read the Nutrition Facts panel on food labels for calorie content and serving size. You can find out how many servings are in a package, the size of a serving, and the number of calories each serving has.   For example, a package might contain 3 cookies. The Nutrition Facts panel on that package says that 1 serving is 1 cookie. Below that, it will say there are 3 servings in the container. The calories section of the Nutrition Facts label says there are 90 calories. This means there are 90 calories in 1 cookie (1 serving). If you eat 1 cookie you have eaten 90 calories. If you eat all 3 cookies, you have eaten 270 calories (3 servings x 90 calories = 270 calories).  The list below tells you how big or small some common portion sizes are.  1 oz.........4 stacked dice.   3 oz........Marland KitchenDeck of cards.   1 tsp.......Marland KitchenTip of little finger.   1 tbs......Marland KitchenMarland KitchenThumb.   2 tbs.......Marland KitchenGolf ball.    cup......Marland KitchenHalf of a fist.   1 cup.......Marland KitchenA fist.  KEEP A FOOD LOG Write down every food item you eat, the amount you eat, and the number of calories in each food you eat during the day. At the end of the day, you can add up the total number of calories you have eaten. It may help to keep a list like the one below. Find out the calorie information by reading the Nutrition Facts panel on food labels. Breakfast  Bran cereal (1 cup, 110 calories).   Fat-free milk ( cup, 45 calories).  Snack  Apple (1 medium, 80 calories).  Lunch  Spinach (1 cup, 20 calories).   Tomato ( medium, 20 calories).   Chicken breast strips (3 oz, 165 calories).   Shredded cheddar cheese ( cup, 110 calories).   Light [place] dressing (2 tbs, 60 calories).    Whole-wheat bread (1 slice, 80 calories).   Tub margarine (1 tsp, 35 calories).   Vegetable soup (1 cup, 160 calories).  Dinner  Pork chop (3 oz, 190 calories).   Brown rice (1 cup, 215 calories).   Steamed broccoli ( cup, 20 calories).   Strawberries (1  cup, 65 calories).   Whipped cream (1 tbs, 50 calories).  Daily Calorie Total: 1425 Document Released: 11/18/2005 Document Revised: 11/07/2011 Document Reviewed: 05/15/2007 [facility] Patient Information 2012 [place], [place].   Exercise to Lose Weight Exercise and a healthy diet may help you lose weight. Your doctor may suggest specific exercises. EXERCISE IDEAS AND TIPS  Choose low-cost things you enjoy doing, such as walking, bicycling, or exercising to workout videos.   Take stairs instead of the elevator.   Walk during your lunch break.   Park your car further away from work or school.   Go to a gym or an exercise class.   Start with 5 to 10 minutes of exercise each day. Build up to 30 minutes of exercise 4 to 6 days a week.   Wear shoes with good support and comfortable clothes.   Stretch before and after working out.   Work out until you breathe harder and your heart beats faster.   Drink extra water when you exercise.   Do not do so much that you hurt yourself, feel dizzy, or get very short of breath.  Exercises that burn about 150 calories:  Running 1  miles in 15 minutes.   Playing volleyball for 45 to 60 minutes.   Washing and waxing a car for 45 to 60 minutes.   Playing touch football for 45 minutes.   Walking 1  miles in 35 minutes.   Pushing a stroller 1  miles in 30 minutes.   Playing basketball for 30 minutes.   Raking leaves for 30 minutes.   Bicycling 5 miles in 30 minutes.   Walking 2 miles in 30 minutes.   Dancing for 30 minutes.   Shoveling snow for 15 minutes.   Swimming laps for 20 minutes.   Walking up stairs for 15 minutes.   Bicycling 4 miles in 15 minutes.     Gardening for 30 to 45 minutes.   Jumping rope for 15 minutes.   Washing windows or floors for 45 to 60 minutes.  Document Released: 12/21/2010 Document Revised: 11/07/2011 Document Reviewed: 12/21/2010 [facility] Patient Information 2012 [place], [place].

## 2012-08-18 NOTE — Progress Notes (Signed)
[address] [place], [place]  [address] Phone: [phone] Fax:  [phone]  Date:  08/18/2012   Name:  Matthew Love   DOB:  07-Mar-1951   MRN:  [id number]  PCP:  Minda Meo, MD  Primary Cardiologist:  Dr. Delane Ginger  Primary Electrophysiologist:  None    History of Present Illness: Matthew Love is a 61 y.o. male who returns for evaluation of chest pain.  He has a history of HTN, DM2, HL, GERD and sleep apnea. He is noncompliant with CPAP.   Echo 11/11: EF 60-65%, grade 1 diastolic dysfunction, mild LAE.  ETT-Myoview 11/11: Inferior fixed defect likely due to diaphragmatic attenuation with normal wall motion, EF 69%.   Over the last 2-3 months, he has noted worsening left-sided chest pain. He can point to it with one finger. It feels dull. He denies any exertional chest pain. He notes it only at rest. He denies any radiating symptoms or associated nausea, diaphoresis. He denies associated shortness of breath. He does have dyspnea with more extreme activities. He denies orthopnea, PND or significant pedal edema. He has had a cough over last several months. This is productive of clear sputum. He was prescribed a Z-Pak at one point with his primary care provider. He denies any recent travels, injuries to his lower extremities or recent hospitalizations.  He denies pleuritic chest pain or chest pain with lying supine.  Sept. 17, 2013 -   He was seen by Tereso Newcomer in August for chest pain. A stress Myoview study was normal. He increase his Nexium and the mild chest discomfort improved.  He still working as a Geophysicist/field seismologist. He's not exercising as much as he would like.   He still goes out for [employer] for breakfast . He insists that he does not eat very much.  Wt Readings from Last 3 Encounters:  08/18/12 296 lb (134.265 kg)  08/06/12 291 lb (131.997 kg)  07/28/12 291 lb (131.997 kg)     Past Medical History  Diagnosis  Date  . Hypertension   . GERD (gastroesophageal reflux disease)   . Diabetes mellitus   . Sleep apnea   . Obesity   . Hyperlipidemia   . H/O echocardiogram     Echo 11/11: EF 60-65%, grade 1 diastolic dysfunction, mild LAE  . H/O cardiovascular stress test     ETT-Myoview 11/11: Inferior fixed defect likely due to diaphragmatic attenuation with normal wall motion, EF 69%    Current Outpatient Prescriptions  Medication Sig Dispense Refill  . aspirin 81 MG tablet Take 81 mg by mouth daily.        Randa Ngo 30 MG/ACT SOLN 2 applicators as directed.       [name] DIOVAN 160 MG tablet TAKE ONE TABLET BY MOUTH EVERY DAY **NEED TO SCHEDULE MD APPOINTMENT FOR MORE REFILLS**  15 each  0  . esomeprazole (NEXIUM) 40 MG capsule Take 40 mg by mouth daily.        . fexofenadine (ALLEGRA) 180 MG tablet Take 180 mg by mouth as needed.        . Multiple Vitamin (MULTIVITAMIN) tablet Take 1 tablet by mouth daily.        . OMEGA 3 1200 MG CAPS Take by mouth 4 (four) times daily.        [name] triamterene-hydrochlorothiazide (MAXZIDE-25) 37.5-25 MG per tablet Take 1 each (1 tablet total) by mouth daily.  30 tablet  0  .  Vardenafil HCl (LEVITRA PO) Take by mouth as needed.          Allergies: Allergies  Allergen Reactions  . Hydrocodone-Acetaminophen     REACTION: rash    History  Substance Use Topics  . Smoking status: Former Smoker -- 3.5 packs/day for 20 years  . Smokeless tobacco: Not on file  . Alcohol Use: No     ROS:  Please see the history of present illness.     All other systems reviewed and negative.   PHYSICAL EXAM: VS:  BP 130/76  Pulse 64  Ht 6\' 2"  (1.88 m)  Wt 296 lb (134.265 kg)  BMI 38.00 kg/m2 Repeat blood pressure by me 120/80  Well nourished, well developed, in no acute distress HEENT: normal Neck: no JVD Vascular: No carotid bruits Endocrine: No thyromegaly Cardiac:  normal S1, S2; RRR; no murmur Lungs:  clear to auscultation bilaterally, no wheezing, rhonchi or  rales Abd: soft, nontender, no hepatomegaly Ext: no edema Skin: warm and dry Neuro:  CNs 2-12 intact, no focal abnormalities noted  EKG:  Sinus rhythm, heart rate 68, left axis, nonspecific ST-T wave changes, no change from prior tracings      ASSESSMENT AND PLAN:

## 2012-08-18 NOTE — Assessment & Plan Note (Signed)
I've encouraged him to work on a better diet and exercise program.  He still eats [employer] every day.  He does not seem so interested on working on this issue

## 2012-08-18 NOTE — Assessment & Plan Note (Signed)
Matthew Love was recently seen for episodes of chest tightness. He had a stress Myoview study that was normal.  He has multiple risk factors for coronary artery disease including hyperlipidemia and obesity.  We spent a lot of time talking about how to reduce the intake of dietary fats. I've also strongly encouraged him to exercise. He insists that he does not eat that much.  I will  see him in one year or for followup visit with fasting lipids, and hepatic profile, and basic metabolic profile.

## 2012-08-21 ENCOUNTER — Encounter: Payer: Self-pay | Admitting: Cardiovascular Disease

## 2012-10-13 ENCOUNTER — Telehealth: Payer: Self-pay | Admitting: Internal Medicine

## 2012-10-13 NOTE — Telephone Encounter (Signed)
Pts wife states that he has been spitting up a little blood for the past 2 days. Discussed with wife that he should be seen either by his PCP/Pulmonary or the ER to be evaluated. Wife states that it has not been much at all. Let her know to go to the ER if it worsened and he should be seen by PCP today. Wife verbalized understanding.

## 2012-12-29 ENCOUNTER — Institutional Professional Consult (permissible substitution): Payer: PRIVATE HEALTH INSURANCE | Admitting: Pulmonary Disease

## 2013-01-01 ENCOUNTER — Institutional Professional Consult (permissible substitution): Payer: PRIVATE HEALTH INSURANCE | Admitting: Pulmonary Disease

## 2013-03-11 ENCOUNTER — Encounter: Payer: Self-pay | Admitting: Pulmonary Disease

## 2013-03-11 ENCOUNTER — Ambulatory Visit (INDEPENDENT_AMBULATORY_CARE_PROVIDER_SITE_OTHER): Payer: PRIVATE HEALTH INSURANCE | Admitting: Pulmonary Disease

## 2013-03-11 VITALS — BP 122/72 | HR 59 | Temp 97.2°F | Ht 74.5 in | Wt 303.2 lb

## 2013-03-11 DIAGNOSIS — G4733 Obstructive sleep apnea (adult) (pediatric): Secondary | ICD-10-CM

## 2013-03-11 NOTE — Patient Instructions (Addendum)
Will start back on cpap, but limit the upside pressure to 10. Work on weight loss followup with me in 6 weeks, but please call if having tolerance issues.

## 2013-03-11 NOTE — Assessment & Plan Note (Addendum)
The patient has a history of moderate obstructive sleep apnea, however was intolerant of CPAP.  He is now having persistent symptoms, and would like to treat his sleep apnea more aggressively.  I had a long discussion with him about the pathophysiology of sleep apnea, including its impact to his quality of life and cardiovascular health.  I will go ahead and set him up on CPAP, and work on tolerance issues.  Also stressed to him the importance of aggressive weight loss.  I will set the patient up on cpap at a moderate pressure level to allow for desensitization, and will troubleshoot the device over the next 4-6weeks if needed.  The pt is to call me if having issues with tolerance.  Will then optimize the pressure once patient is able to wear cpap on a consistent basis.

## 2013-03-11 NOTE — Progress Notes (Signed)
Subjective:    Patient ID: Matthew Love, male    DOB: [birth year], 62 y.o.   MRN: [id number]  HPI The patient is a 62 year old male who been asked to see for management of obstructive sleep apnea.  He was diagnosed in 2007 with moderate disease, within a time of 26 events per hour.  He underwent a CPAP titration study which showed an optimal pressure of 14 cm of water.  The patient was started on CPAP, however could not tolerate because of a feeling of claustrophobia, and also because of the mask fit.  However, he was started on his optimal pressure from CPAP initiation.  The patient has not been on CPAP since that time, and currently is having persistent symptoms.  His wife notes mild snoring, as well as an abnormal breathing pattern during sleep.  The patient has frequent awakenings at night, and does not feel rested in the mornings upon arising.  He has some sleep pressure during the day, however his wife states that he falls asleep a lot during the day with inactivity.  He will also follow asleep in the evening watching television or movies.  He denies any sleepiness while driving.  The patient states his weight is up 20 pounds since 2007, and his Epworth score today is 11.   Sleep Questionnaire What time do you typically go to bed?( Between what hours) 1030-11p 1030-11p at 0913 on 03/11/13 by Nita Sells, CMA How long does it take you to fall asleep?  at 0913 on 03/11/13 by Nita Sells, CMA How many times during the night do you wake up? No Value not sure at 0913 on 03/11/13 by Nita Sells, CMA What time do you get out of bed to start your day? 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 (In pounds) 10 lb (4.536 kg)10 lb (4.536 kg) increase at 0913 on  03/11/13 by Nita Sells, CMA Have you ever had a sleep study before? Yes Yes at 0913 on 03/11/13 by Nita Sells, CMA If yes, location of study? [facility] at 0913 on 03/11/13 by Nita Sells, CMA If yes, date of study? 10+ years ago 10+ years ago at 0913 on 03/11/13 by Nita Sells, CMA Do you currently use CPAP? No No at 0913 on 03/11/13 by Marjo Bicker Mabe, CMA Do you wear oxygen at any time? No No at 0913 on 03/11/13 by Marjo Bicker Mabe, CMA   Review of Systems  Constitutional: Negative for fever and unexpected weight change.  HENT: Positive for postnasal drip ( constant). Negative for ear pain, nosebleeds, congestion, sore throat, rhinorrhea, sneezing, trouble swallowing, dental problem and sinus pressure.   Eyes: Negative for redness and itching.  Respiratory: Positive for cough ( d/t PND) and wheezing. Negative for chest tightness and shortness of breath.   Cardiovascular: Positive for leg swelling ( ankles). Negative for palpitations.  Gastrointestinal: Negative for nausea and vomiting.  Genitourinary: Negative for dysuria.  Musculoskeletal: Positive for joint swelling ( hands).  Skin: Negative for rash.  Neurological: Negative for headaches.  Hematological: Does not bruise/bleed easily.  Psychiatric/Behavioral: Negative for dysphoric mood. The patient is not nervous/anxious.        Objective:   Physical  Exam Constitutional:  Obese male, no acute distress  HENT:  Nares patent without discharge, but septal deviation to left with narrowing  Oropharynx without exudate, palate and uvula are thick and elongated.   Eyes:  Perrla, eomi, no scleral icterus  Neck:  No JVD, no TMG  Cardiovascular:  Normal rate, regular rhythm, no rubs or gallops.  No murmurs        Intact distal pulses  Pulmonary :  Normal breath sounds, no stridor or respiratory distress   No rales, rhonchi, or wheezing  Abdominal:  Soft, nondistended, bowel sounds present.   No tenderness noted.   Musculoskeletal:  No significant lower extremity edema noted.  Lymph Nodes:  No cervical lymphadenopathy noted  Skin:  No cyanosis noted  Neurologic:  Alert, appropriate, moves all 4 extremities without obvious deficit.         Assessment & Plan:

## 2013-03-22 ENCOUNTER — Telehealth: Payer: Self-pay | Admitting: Pulmonary Disease

## 2013-03-22 NOTE — Telephone Encounter (Signed)
I am happy to work with them in any way I can to make it affordable. However, they need to check on the difference btw buying on line, and from dme It is my understanding that if they buy online, and something happens outside of the warranty period, they are stuck with the repair bill.  If they get thru dme company, it is my understanding their insurance company has a Community education officer with that dme to keep the machine working properly.  I do not know if this is true or not, and will require research on their part.  Let me know what they want to do, and I will assist.

## 2013-03-22 NOTE — Telephone Encounter (Signed)
lmomtcb x1 

## 2013-03-22 NOTE — Telephone Encounter (Signed)
Pt was seen by [facility] on 03/11/13; order was placed for:  Note: s9 escape/auto with h/h at 10cm. Help pt with ramp adjustment Mask of choice Does not need a new sleep study. His was In 2007.  -----  Spoke with pt's wife.  They have spoken with Christoper Allegra and was advised the ResMed S9 will be $1670 + monthly payments, the mask can go up to $350, and they will have to pay separate for the humidifer.  She reports her brother is on cpap and has bought his machine from a company online and gets his supplies from this company as well for almost 20 years.  He also has the ResMed S9 machine.  They can get the ResMed S9 machine through this company brand new including the humidifier for $800-900 and the mask will be $99.  She would like to know if [facility] would ok for him to buy the machine and mask through this company to save money and then place order to have a Respiratory Therapist come out to show them how to operate the machine.  If so, she will need a rx for the cpap to turn it in to their insurance.  Dr. Shelle Iron, pls advise.  Thank you.

## 2013-03-23 NOTE — Telephone Encounter (Signed)
lmomtcb x1 for dianne

## 2013-03-23 NOTE — Telephone Encounter (Signed)
LMOM x1 at work number (did not realize was her work number and is now after 5:30pm) LMOM TCB x1 on home number Need machine specifics as requested by [facility] below

## 2013-03-23 NOTE — Telephone Encounter (Signed)
Spoke with pt wife; aware of rules and regulations of DME verses online purchasing of the CPAP. Pt is very well educated on the all the information expressed in this message> she states that they have found to RT in [place] that their insurance will cover to to assist them with the CPAP. Pt states that this process will save them over $1000.00. Pt has found ResMed CPAP and full face mask online for a very affordable cost and when she got an estimate from the DME company, it was going to be entirely too expensive.  Pt wife stated that they needs an order of instructions for the RT or a Rx with the recs on it for them to move forward--also any other recommendations that you may have for them.  Please advise, thanks.

## 2013-03-23 NOTE — Telephone Encounter (Signed)
Get specifics on what type of cpap machine (the specific name) And then just write on prescription for me to sign.

## 2013-03-23 NOTE — Telephone Encounter (Addendum)
Spouse returned call. If not calling back in the next few minutes try spouse # 1pm @ [phone].  Matthew Love

## 2013-03-23 NOTE — Telephone Encounter (Signed)
Wife returning call.  [phone]

## 2013-03-24 NOTE — Telephone Encounter (Signed)
Signed.

## 2013-03-24 NOTE — Telephone Encounter (Signed)
Spoke with spouse She states that the machine needed is Resmed s-9 I have written rx and placed in [facility]'s green folder with attached envelope to mail to pt Please sign and let triage know once this is done, thanks!

## 2013-03-25 NOTE — Telephone Encounter (Signed)
Rx written and signed. Placed up front to be picked up. LMOM x 1 with Graciella Belton

## 2013-03-25 NOTE — Telephone Encounter (Signed)
Pt's wife returned Ashtyn's call. Advised that Rx was left up front for p/u.  Pt's wife requests that we put this in the mail instead of holding for p/u.  Confirmed pt's home address, [address], [place] [address] & stuck in the outgoing mail.  Pt's wife stated nothing further needed at this time.  Antionette Fairy

## 2013-03-25 NOTE — Telephone Encounter (Signed)
ATC Dianne no answer, lmom

## 2013-04-22 ENCOUNTER — Ambulatory Visit: Payer: PRIVATE HEALTH INSURANCE | Admitting: Pulmonary Disease

## 2013-04-23 ENCOUNTER — Encounter: Payer: Self-pay | Admitting: Pulmonary Disease

## 2013-07-27 ENCOUNTER — Ambulatory Visit (HOSPITAL_BASED_OUTPATIENT_CLINIC_OR_DEPARTMENT_OTHER): Payer: PRIVATE HEALTH INSURANCE

## 2013-07-29 ENCOUNTER — Ambulatory Visit (HOSPITAL_BASED_OUTPATIENT_CLINIC_OR_DEPARTMENT_OTHER): Payer: PRIVATE HEALTH INSURANCE | Attending: Otolaryngology | Admitting: *Deleted

## 2013-07-29 DIAGNOSIS — R5381 Other malaise: Secondary | ICD-10-CM | POA: Insufficient documentation

## 2013-07-29 DIAGNOSIS — R0989 Other specified symptoms and signs involving the circulatory and respiratory systems: Secondary | ICD-10-CM | POA: Insufficient documentation

## 2013-07-29 DIAGNOSIS — R0609 Other forms of dyspnea: Secondary | ICD-10-CM | POA: Insufficient documentation

## 2013-07-29 DIAGNOSIS — G4733 Obstructive sleep apnea (adult) (pediatric): Secondary | ICD-10-CM | POA: Insufficient documentation

## 2013-07-30 NOTE — Sleep Study (Signed)
Patient was provided a group education session on the use of the Alice PDX home sleep testing device. The patient acknowledged the procedure instruction and felt confident in performing.

## 2013-07-31 DIAGNOSIS — G4733 Obstructive sleep apnea (adult) (pediatric): Secondary | ICD-10-CM

## 2014-02-20 ENCOUNTER — Emergency Department (HOSPITAL_COMMUNITY)
Admission: EM | Admit: 2014-02-20 | Discharge: 2014-02-20 | Disposition: A | Discharge status: Home / Self Care | Payer: PRIVATE HEALTH INSURANCE | Attending: Emergency Medicine | Admitting: Emergency Medicine

## 2014-02-20 ENCOUNTER — Encounter (HOSPITAL_COMMUNITY): Payer: Self-pay | Admitting: Emergency Medicine

## 2014-02-20 DIAGNOSIS — R062 Wheezing: Secondary | ICD-10-CM | POA: Insufficient documentation

## 2014-02-20 DIAGNOSIS — T40601A Poisoning by unspecified narcotics, accidental (unintentional), initial encounter: Secondary | ICD-10-CM | POA: Insufficient documentation

## 2014-02-20 DIAGNOSIS — T6391XA Toxic effect of contact with unspecified venomous animal, accidental (unintentional), initial encounter: Secondary | ICD-10-CM | POA: Insufficient documentation

## 2014-02-20 DIAGNOSIS — Z87891 Personal history of nicotine dependence: Secondary | ICD-10-CM | POA: Insufficient documentation

## 2014-02-20 DIAGNOSIS — Z8673 Personal history of transient ischemic attack (TIA), and cerebral infarction without residual deficits: Secondary | ICD-10-CM | POA: Insufficient documentation

## 2014-02-20 DIAGNOSIS — R05 Cough: Secondary | ICD-10-CM | POA: Insufficient documentation

## 2014-02-20 DIAGNOSIS — Y9389 Activity, other specified: Secondary | ICD-10-CM | POA: Insufficient documentation

## 2014-02-20 DIAGNOSIS — Z79899 Other long term (current) drug therapy: Secondary | ICD-10-CM | POA: Insufficient documentation

## 2014-02-20 DIAGNOSIS — R059 Cough, unspecified: Secondary | ICD-10-CM | POA: Insufficient documentation

## 2014-02-20 DIAGNOSIS — I10 Essential (primary) hypertension: Secondary | ICD-10-CM | POA: Insufficient documentation

## 2014-02-20 DIAGNOSIS — T400X1A Poisoning by opium, accidental (unintentional), initial encounter: Secondary | ICD-10-CM | POA: Insufficient documentation

## 2014-02-20 DIAGNOSIS — Y9289 Other specified places as the place of occurrence of the external cause: Secondary | ICD-10-CM | POA: Insufficient documentation

## 2014-02-20 DIAGNOSIS — E785 Hyperlipidemia, unspecified: Secondary | ICD-10-CM | POA: Insufficient documentation

## 2014-02-20 DIAGNOSIS — Z7982 Long term (current) use of aspirin: Secondary | ICD-10-CM | POA: Insufficient documentation

## 2014-02-20 DIAGNOSIS — R21 Rash and other nonspecific skin eruption: Secondary | ICD-10-CM | POA: Insufficient documentation

## 2014-02-20 DIAGNOSIS — K219 Gastro-esophageal reflux disease without esophagitis: Secondary | ICD-10-CM | POA: Insufficient documentation

## 2014-02-20 DIAGNOSIS — E119 Type 2 diabetes mellitus without complications: Secondary | ICD-10-CM | POA: Insufficient documentation

## 2014-02-20 DIAGNOSIS — E669 Obesity, unspecified: Secondary | ICD-10-CM | POA: Insufficient documentation

## 2014-02-20 DIAGNOSIS — T7840XA Allergy, unspecified, initial encounter: Secondary | ICD-10-CM

## 2014-02-20 MED ORDER — FAMOTIDINE 20 MG PO TABS: 20.0000 mg | ORAL_TABLET | Freq: Every day | ORAL | Status: DC

## 2014-02-20 MED ORDER — EPINEPHRINE 0.3 MG/0.3ML IJ SOAJ: 0.3000 mg | Freq: Once | INTRAMUSCULAR | Status: AC

## 2014-02-20 MED ORDER — PREDNISONE 50 MG PO TABS
60.0000 mg | ORAL_TABLET | Freq: Once | ORAL | Status: AC
  Administered 2014-02-20: 60 mg via ORAL
  Filled 2014-02-20 (×2): qty 1

## 2014-02-20 MED ORDER — PREDNISONE 20 MG PO TABS: 60.0000 mg | ORAL_TABLET | Freq: Every day | ORAL | Status: DC

## 2014-02-20 MED ORDER — FAMOTIDINE 20 MG PO TABS
20.0000 mg | ORAL_TABLET | Freq: Once | ORAL | Status: AC
  Administered 2014-02-20: 20 mg via ORAL
  Filled 2014-02-20: qty 1

## 2014-02-20 NOTE — Discharge Instructions (Signed)
Allergies °Allergies may happen from anything your body is sensitive to. This may be food, medicines, pollens, chemicals, and nearly anything around you in everyday life that produces allergens. An allergen is anything that causes an allergy producing substance. Heredity is often a factor in causing these problems. This means you may have some of the same allergies as your parents. °Food allergies happen in all age groups. Food allergies are some of the most severe and life threatening. Some common food allergies are cow's milk, seafood, eggs, nuts, wheat, and soybeans. °SYMPTOMS  °· Swelling around the mouth. °· An itchy red rash or hives. °· Vomiting or diarrhea. °· Difficulty breathing. °SEVERE ALLERGIC REACTIONS ARE LIFE-THREATENING. °This reaction is called anaphylaxis. It can cause the mouth and throat to swell and cause difficulty with breathing and swallowing. In severe reactions only a trace amount of food (for example, peanut oil in a salad) may cause death within seconds. °Seasonal allergies occur in all age groups. These are seasonal because they usually occur during the same season every year. They may be a reaction to molds, grass pollens, or tree pollens. Other causes of problems are house dust mite allergens, pet dander, and mold spores. The symptoms often consist of nasal congestion, a runny itchy nose associated with sneezing, and tearing itchy eyes. There is often an associated itching of the mouth and ears. The problems happen when you come in contact with pollens and other allergens. Allergens are the particles in the air that the body reacts to with an allergic reaction. This causes you to release allergic antibodies. Through a chain of events, these eventually cause you to release histamine into the blood stream. Although it is meant to be protective to the body, it is this release that causes your discomfort. This is why you were given anti-histamines to feel better.  If you are unable to  pinpoint the offending allergen, it may be determined by skin or blood testing. Allergies cannot be cured but can be controlled with medicine. °Hay fever is a collection of all or some of the seasonal allergy problems. It may often be treated with simple over-the-counter medicine such as diphenhydramine. Take medicine as directed. Do not drink alcohol or drive while taking this medicine. Check with your caregiver or package insert for child dosages. °If these medicines are not effective, there are many new medicines your caregiver can prescribe. Stronger medicine such as nasal spray, eye drops, and corticosteroids may be used if the first things you try do not work well. Other treatments such as immunotherapy or desensitizing injections can be used if all else fails. Follow up with your caregiver if problems continue. These seasonal allergies are usually not life threatening. They are generally more of a nuisance that can often be handled using medicine. °HOME CARE INSTRUCTIONS  °· If unsure what causes a reaction, keep a diary of foods eaten and symptoms that follow. Avoid foods that cause reactions. °· If hives or rash are present: °· Take medicine as directed. °· You may use an over-the-counter antihistamine (diphenhydramine) for hives and itching as needed. °· Apply cold compresses (cloths) to the skin or take baths in cool water. Avoid hot baths or showers. Heat will make a rash and itching worse. °· If you are severely allergic: °· Following a treatment for a severe reaction, hospitalization is often required for closer follow-up. °· Wear a medic-alert bracelet or necklace stating the allergy. °· You and your family must learn how to give adrenaline or use   an anaphylaxis kit. °· If you have had a severe reaction, always carry your anaphylaxis kit or EpiPen® with you. Use this medicine as directed by your caregiver if a severe reaction is occurring. Failure to do so could have a fatal outcome. °SEEK MEDICAL  CARE IF: °· You suspect a food allergy. Symptoms generally happen within 30 minutes of eating a food. °· Your symptoms have not gone away within 2 days or are getting worse. °· You develop new symptoms. °· You want to retest yourself or your child with a food or drink you think causes an allergic reaction. Never do this if an anaphylactic reaction to that food or drink has happened before. Only do this under the care of a caregiver. °SEEK IMMEDIATE MEDICAL CARE IF:  °· You have difficulty breathing, are wheezing, or have a tight feeling in your chest or throat. °· You have a swollen mouth, or you have hives, swelling, or itching all over your body. °· You have had a severe reaction that has responded to your anaphylaxis kit or an EpiPen®. These reactions may return when the medicine has worn off. These reactions should be considered life threatening. °MAKE SURE YOU:  °· Understand these instructions. °· Will watch your condition. °· Will get help right away if you are not doing well or get worse. °Document Released: 02/11/2003 Document Revised: 03/15/2013 Document Reviewed: 07/18/2008 °[employer]® Patient Information ©2014 [employer]. ° °

## 2014-02-20 NOTE — ED Provider Notes (Signed)
CSN: [id number]     Arrival date & time 02/20/14  6160 History  This chart was scribed for Matthew Hacker, MD by Zettie Pho, ED Scribe. This patient was seen in room APA17/APA17 and the patient's care was started at 8:32 PM.    Chief Complaint  Patient presents with  . Allergic Reaction   The history is provided by the patient. No language interpreter was used.   HPI Comments: Matthew Love is a 63 y.o. 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 He denies any potential exposure to his known allergens or recent exposure to new foods. Patient reports taking Claritin and Benadryl at home with moderate relief and states that his symptoms have been gradually improving. Patient reports that he does not currently have an epi-pen at home. He denies chest pain, shortness of breath. Patient has a history of HTN, hyperlipidemia, GERD, DM, and mini-stroke.   Past Medical History  Diagnosis Date  . Hypertension   . GERD (gastroesophageal reflux disease)   . Diabetes mellitus   . Sleep apnea   . Obesity   . Hyperlipidemia   . H/O echocardiogram     Echo 11/11: EF 73-71%, grade 1 diastolic dysfunction, mild LAE  . H/O cardiovascular stress test     ETT-Myoview 11/11: Inferior fixed defect likely due to diaphragmatic attenuation with normal wall motion, EF 69%  . Mini stroke    Past Surgical History  Procedure Laterality Date  . Knee surgery    . Bunionectomy    . Tonsillectomy    . Hydrocell removal     Family History  Problem Relation Age of Onset  . Heart attack Mother   . Prostate cancer Father   . Heart disease Other     paternal grandparents  . Alzheimer's disease Maternal Grandmother    History  Substance Use Topics  . Smoking  status: Former Smoker -- 3.50 packs/day for 20 years    Types: Cigarettes    Quit date: 12/02/1984  . Smokeless tobacco: Not on file  . Alcohol Use: No    Review of Systems  Constitutional: Negative.  Negative for fever.  HENT: Negative for sore throat, trouble swallowing and voice change.   Respiratory: Positive for cough and wheezing. Negative for chest tightness and shortness of breath.   Cardiovascular: Negative.  Negative for chest pain.  Gastrointestinal: Negative.   Genitourinary: Negative.  Negative for dysuria.  Musculoskeletal: Negative for back pain.  Skin: Positive for rash.  Neurological: Negative for headaches.  All other systems reviewed and are negative.   Allergies  Bee venom and Hydrocodone-acetaminophen  Home Medications   Current Outpatient Rx  Name  Route  Sig  Dispense  Refill  . aspirin 81 MG tablet   Oral   Take 81 mg by mouth daily.           [name] DIOVAN 160 MG tablet      TAKE ONE TABLET BY MOUTH EVERY DAY **NEED TO SCHEDULE MD APPOINTMENT FOR MORE REFILLS**   15 each   0   . escitalopram (LEXAPRO) 10 MG tablet   Oral   Take 1 tablet by mouth daily.         [name] esomeprazole (NEXIUM) 40 MG capsule   Oral   Take 40  mg by mouth daily.           . fexofenadine (ALLEGRA) 180 MG tablet   Oral   Take 180 mg by mouth as needed.           . Multiple Vitamin (MULTIVITAMIN) tablet   Oral   Take 1 tablet by mouth daily.           . OMEGA 3 1200 MG CAPS   Oral   Take 1,200 mg by mouth daily.          [name] triamterene-hydrochlorothiazide (MAXZIDE-25) 37.5-25 MG per tablet   Oral   Take 1 each (1 tablet total) by mouth daily.   30 tablet   0     Pt needs appointment then refill can be made    Triage Vitals: BP 137/64  Pulse 83  Temp(Src) 97.9 F (36.6 C) (Oral)  Resp 20  Ht 6' 2.5" (1.892 m)  Wt 295 lb (133.811 kg)  BMI 37.38 kg/m2  SpO2 98%  Physical Exam  Nursing note and vitals reviewed. Constitutional: He is oriented to  person, place, and time. He appears well-developed and well-nourished. No distress.  HENT:  Head: Normocephalic and atraumatic.  Mouth/Throat: Oropharynx is clear and moist.  No oropharyngeal swelling noted  Eyes: Pupils are equal, round, and reactive to light.  Neck: Neck supple.  Cardiovascular: Normal rate, regular rhythm and normal heart sounds.   No murmur heard. Pulmonary/Chest: Effort normal and breath sounds normal. No respiratory distress. He has no wheezes.  Abdominal: Soft. There is no tenderness.  Musculoskeletal: He exhibits no edema.  Lymphadenopathy:    He has no cervical adenopathy.  Neurological: He is alert and oriented to person, place, and time.  Skin: Skin is warm and dry.  Maculopapular rash noted bilateral groin area, patient reports that it is itchy, no other rash noted  Psychiatric: He has a normal mood and affect.    ED Course  Procedures (including critical care time)  DIAGNOSTIC STUDIES: Oxygen Saturation is 98% on room air, normal by my interpretation.    COORDINATION OF CARE: 8:36 PM- Will start patient on steroids and Pepcid to manage symptoms. Advised patient to closely monitor his CBG while taking the steroids. Will discharge patient with an epi-pen. Discussed treatment plan with patient at bedside and patient verbalized agreement.     Labs Review Labs Reviewed - No data to display Imaging Review No results found.   EKG Interpretation None      MDM   Final diagnoses:  Allergic reaction    Patient presents with possible allergic reaction. Unknown antigen or exposure. Patient is well-appearing on exam. No signs of anaphylaxis. He has an itchy rash which is just limited to his bilateral inguinal area. No wheezing on exam. Patient was given Pepcid and prednisone. Patient was instructed to monitor for recurrence of symptoms. Will be given an EpiPen at discharge. Patient continue prednisone and Pepcid daily for the next 4 days. Patient stated  understanding.  After history, exam, and medical workup I feel the patient has been appropriately medically screened and is safe for discharge home. Pertinent diagnoses were discussed with the patient. Patient was given return precautions.   I personally performed the services described in this documentation, which was scribed in my presence. The recorded information has been reviewed and is accurate.     Matthew Hacker, MD 02/20/14 2046

## 2014-02-20 NOTE — ED Notes (Signed)
Patient complaining of generalized rash, itching, and wheezing starting approximately 1600 today. States he took a total of 3 benadryl with some relief. No obvious distress noted at triage.

## 2014-07-25 ENCOUNTER — Encounter: Payer: Self-pay | Admitting: Podiatry

## 2014-07-25 ENCOUNTER — Ambulatory Visit (INDEPENDENT_AMBULATORY_CARE_PROVIDER_SITE_OTHER): Payer: PRIVATE HEALTH INSURANCE | Admitting: Podiatry

## 2014-07-25 ENCOUNTER — Ambulatory Visit (INDEPENDENT_AMBULATORY_CARE_PROVIDER_SITE_OTHER): Payer: PRIVATE HEALTH INSURANCE

## 2014-07-25 VITALS — BP 136/76 | HR 66 | Resp 18

## 2014-07-25 DIAGNOSIS — M7660 Achilles tendinitis, unspecified leg: Secondary | ICD-10-CM

## 2014-07-25 DIAGNOSIS — Q6650 Congenital pes planus, unspecified foot: Secondary | ICD-10-CM

## 2014-07-25 DIAGNOSIS — R52 Pain, unspecified: Secondary | ICD-10-CM

## 2014-07-25 DIAGNOSIS — M21611 Bunion of right foot: Secondary | ICD-10-CM

## 2014-07-25 DIAGNOSIS — M21619 Bunion of unspecified foot: Secondary | ICD-10-CM

## 2014-07-25 DIAGNOSIS — M7662 Achilles tendinitis, left leg: Secondary | ICD-10-CM

## 2014-07-25 NOTE — Progress Notes (Signed)
   Subjective:    Patient ID: LARWENCE TU, male    DOB: 11-29-1951, 63 y.o.   MRN: [id number]  HPI  63 year old male presents today with complaints of left Achilles tendon pain and bilateral foot "turning in". He states that the pain has been present for approximately one year although it has increased recently as he has been walking approximately 2 miles per day. He states that previously he was unable to touch the distal Achilles tendon however today it does not seem to be bothering him as much. He currently does wear an over-the-counter orthotic. No history of trauma to the area. No other complaints at this time    Review of Systems  HENT: Positive for hearing loss.   Musculoskeletal: Positive for gait problem.  All other systems reviewed and are negative.      Objective:   Physical Exam AAO x3, NAD DP/PT pulses palpable 2/4 b/l. CRT < 3 sec Protective sensation intact with a Semmes Weinstein monofilament, vibratory sensation intact. Left Achilles tendon with no tenderness on palpation along the course of the tendon or at the insertion. Achilles tendon intact.  Decrease in medial arch height upon weightbearing. Able to perform double heel rise, however difficulty with single heel rise b/l. Increased pronation through gait.  Bunion deformity right foot.  Mild equinus b/l.  MMT 5/5 b/l.  Hyperkeratotic lesions medial hallux bilaterally. Scar over dorsal medial aspect the left foot from prior bunionectomy. 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 -X-rays were obtained. The x-ray report for full details. No acute fracture -Conservative versus surgical intervention was discussed the patient in detail including alternatives, risks, complications. -At this time recommended a more supportive orthotic for the patient as to his current orthotic is flexible and not providing much support. -Discussed  stretching exercises. -Ice to the area -Discussed that if the Achilles tendon becomes more painful we'll likely consider immobilization, possible heel lift but for now no symptoms. -Patients wife was inquiring about bunion surgery to the right foot and this was discussed with the patient. At this time the bunion is not painful so therefore we will hold off on any surgical intervention. Discussed that if the bunion does become symptomatic we will consider surgical intervention for that side -Followup in one month or sooner if any problems are to arise or any changes in symptoms  Disclaimer: This note was dictated with voice recognition software. Similar sounding words can inadvertently be transcribed and may not be corrected upon review.

## 2014-07-25 NOTE — Patient Instructions (Signed)
Achilles Tendinitis   with Rehab  Achilles tendinitis is a disorder of the Achilles tendon. The Achilles tendon connects the large calf muscles (Gastrocnemius and Soleus) to the heel bone (calcaneus). This tendon is sometimes called the heel cord. It is important for pushing-off and standing on your toes and is important for walking, running, or jumping. Tendinitis is often caused by overuse and repetitive microtrauma.  SYMPTOMS  · Pain, tenderness, swelling, warmth, and redness may occur over the Achilles tendon even at rest.  · Pain with pushing off, or flexing or extending the ankle.  · Pain that is worsened after or during activity.  CAUSES   · Overuse sometimes seen with rapid increase in exercise programs or in sports requiring running and jumping.  · Poor physical conditioning (strength and flexibility or endurance).  · Running sports, especially training running down hills.  · Inadequate warm-up before practice or play or failure to stretch before participation.  · Injury to the tendon.  PREVENTION   · Warm up and stretch before practice or competition.  · Allow time for adequate rest and recovery between practices and competition.  · Keep up conditioning.  ¨ Keep up ankle and leg flexibility.  ¨ Improve or keep muscle strength and endurance.  ¨ Improve cardiovascular fitness.  · Use proper technique.  · Use proper equipment (shoes, skates).  · To help prevent recurrence, taping, protective strapping, or an adhesive bandage may be recommended for several weeks after healing is complete.  PROGNOSIS   · Recovery may take weeks to several months to heal.  · Longer recovery is expected if symptoms have been prolonged.  · Recovery is usually quicker if the inflammation is due to a direct blow as compared with overuse or sudden strain.  RELATED COMPLICATIONS   · Healing time will be prolonged if the condition is not correctly treated. The injury must be given plenty of time to heal.  · Symptoms can reoccur if  activity is resumed too soon.  · Untreated, tendinitis may increase the risk of tendon rupture requiring additional time for recovery and possibly surgery.  TREATMENT   · The first treatment consists of rest anti-inflammatory medication, and ice to relieve the pain.  · Stretching and strengthening exercises after resolution of pain will likely help reduce the risk of recurrence. Referral to a physical therapist or athletic trainer for further evaluation and treatment may be helpful.  · A walking boot or cast may be recommended to rest the Achilles tendon. This can help break the cycle of inflammation and microtrauma.  · Arch supports (orthotics) may be prescribed or recommended by your caregiver as an adjunct to therapy and rest.  · Surgery to remove the inflamed tendon lining or degenerated tendon tissue is rarely necessary and has shown less than predictable results.  MEDICATION   · Nonsteroidal anti-inflammatory medications, such as aspirin and ibuprofen, may be used for pain and inflammation relief. Do not take within 7 days before surgery. Take these as directed by your caregiver. Contact your caregiver immediately if any bleeding, stomach upset, or signs of allergic reaction occur. Other minor pain relievers, such as acetaminophen, may also be used.  · Pain relievers may be prescribed as necessary by your caregiver. Do not take prescription pain medication for longer than 4 to 7 days. Use only as directed and only as much as you need.  · Cortisone injections are rarely indicated. Cortisone injections may weaken tendons and predispose to rupture. It is better   to give the condition more time to heal than to use them.  HEAT AND COLD  · Cold is used to relieve pain and reduce inflammation for acute and chronic Achilles tendinitis. Cold should be applied for 10 to 15 minutes every 2 to 3 hours for inflammation and pain and immediately after any activity that aggravates your symptoms. Use ice packs or an ice  massage.  · Heat may be used before performing stretching and strengthening activities prescribed by your caregiver. Use a heat pack or a warm soak.  SEEK MEDICAL CARE IF:  · Symptoms get worse or do not improve in 2 weeks despite treatment.  · New, unexplained symptoms develop. Drugs used in treatment may produce side effects.  EXERCISES  RANGE OF MOTION (ROM) AND STRETCHING EXERCISES - Achilles Tendinitis   These exercises may help you when beginning to rehabilitate your injury. Your symptoms may resolve with or without further involvement from your physician, physical therapist or athletic trainer. While completing these exercises, remember:   · Restoring tissue flexibility helps normal motion to return to the joints. This allows healthier, less painful movement and activity.  · An effective stretch should be held for at least 30 seconds.  · A stretch should never be painful. You should only feel a gentle lengthening or release in the stretched tissue.  STRETCH - Gastroc, Standing   · Place hands on wall.  · Extend right / left leg, keeping the front knee somewhat bent.  · Slightly point your toes inward on your back foot.  · Keeping your right / left heel on the floor and your knee straight, shift your weight toward the wall, not allowing your back to arch.  · You should feel a gentle stretch in the right / left calf. Hold this position for __________ seconds.  Repeat __________ times. Complete this stretch __________ times per day.  STRETCH - Soleus, Standing   · Place hands on wall.  · Extend right / left leg, keeping the other knee somewhat bent.  · Slightly point your toes inward on your back foot.  · Keep your right / left heel on the floor, bend your back knee, and slightly shift your weight over the back leg so that you feel a gentle stretch deep in your back calf.  · Hold this position for __________ seconds.  Repeat __________ times. Complete this stretch __________ times per day.  STRETCH -  Gastrocsoleus, Standing   Note: This exercise can place a lot of stress on your foot and ankle. Please complete this exercise only if specifically instructed by your caregiver.   · Place the ball of your right / left foot on a step, keeping your other foot firmly on the same step.  · Hold on to the wall or a rail for balance.  · Slowly lift your other foot, allowing your body weight to press your heel down over the edge of the step.  · You should feel a stretch in your right / left calf.  · Hold this position for __________ seconds.  · Repeat this exercise with a slight bend in your knee.  Repeat __________ times. Complete this stretch __________ times per day.   STRENGTHENING EXERCISES - Achilles Tendinitis  These exercises may help you when beginning to rehabilitate your injury. They may resolve your symptoms with or without further involvement from your physician, physical therapist or athletic trainer. While completing these exercises, remember:   · Muscles can gain both the endurance   and the strength needed for everyday activities through controlled exercises.  · Complete these exercises as instructed by your physician, physical therapist or athletic trainer. Progress the resistance and repetitions only as guided.  · You may experience muscle soreness or fatigue, but the pain or discomfort you are trying to eliminate should never worsen during these exercises. If this pain does worsen, stop and make certain you are following the directions exactly. If the pain is still present after adjustments, discontinue the exercise until you can discuss the trouble with your clinician.  STRENGTH - Plantar-flexors   · Sit with your right / left leg extended. Holding onto both ends of a rubber exercise band/tubing, loop it around the ball of your foot. Keep a slight tension in the band.  · Slowly push your toes away from you, pointing them downward.  · Hold this position for __________ seconds. Return slowly, controlling the  tension in the band/tubing.  Repeat __________ times. Complete this exercise __________ times per day.   STRENGTH - Plantar-flexors   · Stand with your feet shoulder width apart. Steady yourself with a wall or table using as little support as needed.  · Keeping your weight evenly spread over the width of your feet, rise up on your toes.*  · Hold this position for __________ seconds.  Repeat __________ times. Complete this exercise __________ times per day.   *If this is too easy, shift your weight toward your right / left leg until you feel challenged. Ultimately, you may be asked to do this exercise with your right / left foot only.  STRENGTH - Plantar-flexors, Eccentric   Note: This exercise can place a lot of stress on your foot and ankle. Please complete this exercise only if specifically instructed by your caregiver.   · Place the balls of your feet on a step. With your hands, use only enough support from a wall or rail to keep your balance.  · Keep your knees straight and rise up on your toes.  · Slowly shift your weight entirely to your right / left toes and pick up your opposite foot. Gently and with controlled movement, lower your weight through your right / left foot so that your heel drops below the level of the step. You will feel a slight stretch in the back of your calf at the end position.  · Use the healthy leg to help rise up onto the balls of both feet, then lower weight only on the right / left leg again. Build up to 15 repetitions. Then progress to 3 consecutive sets of 15 repetitions.*  · After completing the above exercise, complete the same exercise with a slight knee bend (about 30 degrees). Again, build up to 15 repetitions. Then progress to 3 consecutive sets of 15 repetitions.*  Perform this exercise __________ times per day.   *When you easily complete 3 sets of 15, your physician, physical therapist or athletic trainer may advise you to add resistance by wearing a backpack filled with  additional weight.  STRENGTH - Plantar Flexors, Seated   · Sit on a chair that allows your feet to rest flat on the ground. If necessary, sit at the edge of the chair.  · Keeping your toes firmly on the ground, lift your right / left heel as far as you can without increasing any discomfort in your ankle.  Repeat __________ times. Complete this exercise __________ times a day.  *If instructed by your physician, physical therapist or athletic   trainer, you may add ____________________ of resistance by placing a weighted object on your right / left knee.  Document Released: 06/19/2005 Document Revised: 02/10/2012 Document Reviewed: 03/02/2009  [employer]® Patient Information ©2015 [employer]. This information is not intended to replace advice given to you by your health care provider. Make sure you discuss any questions you have with your health care provider.

## 2014-08-09 ENCOUNTER — Ambulatory Visit: Payer: PRIVATE HEALTH INSURANCE | Admitting: Cardiovascular Disease

## 2014-08-17 ENCOUNTER — Ambulatory Visit: Payer: PRIVATE HEALTH INSURANCE | Admitting: Podiatry

## 2014-09-02 ENCOUNTER — Encounter (HOSPITAL_COMMUNITY): Payer: Self-pay | Admitting: Pharmacy Technician

## 2014-09-05 ENCOUNTER — Other Ambulatory Visit (HOSPITAL_COMMUNITY): Payer: Self-pay | Admitting: *Deleted

## 2014-09-05 NOTE — Pre-Procedure Instructions (Addendum)
Matthew Love  09/05/2014   Your procedure is scheduled on:  Thursday, September 08, 2014 .   Report to [facility] Entrance "A" Admitting Office at 8:30 AM.   Call this number if you have problems the morning of surgery: [phone]   Remember:   Do not eat food or drink liquids after midnight Wednesday, 09/07/14.   Take these medicines the morning of surgery with A SIP OF WATER: escitalopram (LEXAPRO), esomeprazole ([place]), fexofenadine (ALLEGRA) - if needed  Stop Aspirin Vitamins, Aleve, and Fish Oil as of today.STOP all herbel meds, nsaids (aleve,naproxen,advil,ibuprofen)   Do not wear jewelry.  Do not wear lotions, powders, or cologne. You may wear deodorant.  Men may shave face and neck.  Do not bring valuables to the hospital.  [facility] is not responsible                  for any belongings or valuables.               Contacts, dentures or bridgework may not be worn into surgery.  Leave suitcase in the car. After surgery it may be brought to your room.  For patients admitted to the hospital, discharge time is determined by your                treatment team.               Patients discharged the day of surgery will not be allowed to drive home.    Special Instructions: [facility] - Preparing for Surgery  Before surgery, you can play an important role.  Because skin is not sterile, your skin needs to be as free of germs as possible.  You can reduce the number of germs on you skin by washing with CHG (chlorahexidine gluconate) soap before surgery.  CHG is an antiseptic cleaner which kills germs and bonds with the skin to continue killing germs even after washing.  Please DO NOT use if you have an allergy to CHG or antibacterial soaps.  If your skin becomes reddened/irritated stop using the CHG and inform your nurse when you arrive at Short Stay.  Do not shave (including legs and underarms) for at least 48 hours prior to the first CHG shower.  You may shave your  face.  Please follow these instructions carefully:   1.  Shower with CHG Soap the night before surgery and the                                morning of Surgery.  2.  If you choose to wash your hair, wash your hair first as usual with your       normal shampoo.  3.  After you shampoo, rinse your hair and body thoroughly to remove the                      Shampoo.  4.  Use CHG as you would any other liquid soap.  You can apply chg directly       to the skin and wash gently with scrungie or a clean washcloth.  5.  Apply the CHG Soap to your body ONLY FROM THE NECK DOWN.        Do not use on open wounds or open sores.  Avoid contact with your eyes, ears, mouth and genitals (private parts).  Wash genitals (private parts) with your  normal soap.  6.  Wash thoroughly, paying special attention to the area where your surgery        will be performed.  7.  Thoroughly rinse your body with warm water from the neck down.  8.  DO NOT shower/wash with your normal soap after using and rinsing off       the CHG Soap.  9.  Pat yourself dry with a clean towel.            10.  Wear clean pajamas.            11.  Place clean sheets on your bed the night of your first shower and do not        sleep with pets.  Day of Surgery  Do not apply any lotions the morning of surgery.  Please wear clean clothes to the hospital/surgery center.     Please read over the following fact sheets that you were given: Pain Booklet, Coughing and Deep Breathing and Surgical Site Infection Prevention

## 2014-09-05 NOTE — Progress Notes (Signed)
No pre-op orders in EPIC, called Dr. Noreene Filbert office and spoke with Amy. Requested orders.

## 2014-09-06 ENCOUNTER — Encounter (HOSPITAL_COMMUNITY)
Admission: RE | Admit: 2014-09-06 | Discharge: 2014-09-06 | Disposition: A | Discharge status: Home / Self Care | Payer: 59 | Source: Ambulatory Visit | Attending: Anesthesiology | Admitting: Anesthesiology

## 2014-09-06 ENCOUNTER — Encounter (HOSPITAL_COMMUNITY): Payer: Self-pay

## 2014-09-06 ENCOUNTER — Other Ambulatory Visit: Payer: Self-pay | Admitting: Otolaryngology

## 2014-09-06 ENCOUNTER — Encounter (HOSPITAL_COMMUNITY)
Admission: RE | Admit: 2014-09-06 | Discharge: 2014-09-06 | Disposition: A | Discharge status: Home / Self Care | Payer: 59 | Source: Ambulatory Visit | Attending: Otolaryngology | Admitting: Otolaryngology

## 2014-09-06 DIAGNOSIS — E785 Hyperlipidemia, unspecified: Secondary | ICD-10-CM | POA: Diagnosis not present

## 2014-09-06 DIAGNOSIS — K219 Gastro-esophageal reflux disease without esophagitis: Secondary | ICD-10-CM | POA: Diagnosis not present

## 2014-09-06 DIAGNOSIS — Z885 Allergy status to narcotic agent status: Secondary | ICD-10-CM | POA: Diagnosis not present

## 2014-09-06 DIAGNOSIS — Z8673 Personal history of transient ischemic attack (TIA), and cerebral infarction without residual deficits: Secondary | ICD-10-CM | POA: Diagnosis not present

## 2014-09-06 DIAGNOSIS — M1990 Unspecified osteoarthritis, unspecified site: Secondary | ICD-10-CM | POA: Diagnosis not present

## 2014-09-06 DIAGNOSIS — Z87891 Personal history of nicotine dependence: Secondary | ICD-10-CM | POA: Diagnosis not present

## 2014-09-06 DIAGNOSIS — Z91030 Bee allergy status: Secondary | ICD-10-CM | POA: Diagnosis not present

## 2014-09-06 DIAGNOSIS — G4733 Obstructive sleep apnea (adult) (pediatric): Secondary | ICD-10-CM | POA: Diagnosis not present

## 2014-09-06 DIAGNOSIS — J342 Deviated nasal septum: Secondary | ICD-10-CM | POA: Diagnosis not present

## 2014-09-06 DIAGNOSIS — I10 Essential (primary) hypertension: Secondary | ICD-10-CM | POA: Diagnosis not present

## 2014-09-06 DIAGNOSIS — Z6837 Body mass index (BMI) 37.0-37.9, adult: Secondary | ICD-10-CM | POA: Diagnosis not present

## 2014-09-06 DIAGNOSIS — J343 Hypertrophy of nasal turbinates: Secondary | ICD-10-CM | POA: Diagnosis not present

## 2014-09-06 HISTORY — DX: Personal history of other diseases of the digestive system: Z87.19

## 2014-09-06 HISTORY — DX: Pneumonia, unspecified organism: J18.9

## 2014-09-06 HISTORY — DX: Cerebral infarction, unspecified: I63.9

## 2014-09-06 HISTORY — DX: Anxiety disorder, unspecified: F41.9

## 2014-09-06 HISTORY — DX: Unspecified osteoarthritis, unspecified site: M19.90

## 2014-09-06 LAB — CBC
HEMATOCRIT: 47.4 % (ref 39.0–52.0)
Hemoglobin: 16 g/dL (ref 13.0–17.0)
MCH: 28.4 pg (ref 26.0–34.0)
MCHC: 33.8 g/dL (ref 30.0–36.0)
MCV: 84.2 fL (ref 78.0–100.0)
PLATELETS: 227 10*3/uL (ref 150–400)
RBC: 5.63 MIL/uL (ref 4.22–5.81)
RDW: 13 % (ref 11.5–15.5)
WBC: 7.9 10*3/uL (ref 4.0–10.5)

## 2014-09-06 LAB — BASIC METABOLIC PANEL
Anion gap: 12 (ref 5–15)
BUN: 19 mg/dL (ref 6–23)
CO2: 28 meq/L (ref 19–32)
CREATININE: 1.05 mg/dL (ref 0.50–1.35)
Calcium: 9.5 mg/dL (ref 8.4–10.5)
Chloride: 101 mEq/L (ref 96–112)
GFR calc Af Amer: 85 mL/min — ABNORMAL LOW (ref >90)
GFR, EST NON AFRICAN AMERICAN: 74 mL/min — AB (ref >90)
GLUCOSE: 110 mg/dL — AB (ref 70–99)
Potassium: 3.8 mEq/L (ref 3.7–5.3)
SODIUM: 141 meq/L (ref 137–147)

## 2014-09-06 NOTE — H&P (Signed)
Carey, Johndrow 63 y.o., male [id number]     Chief Complaint: nasal obstruction, sleep apnea  HPI: 63 year old white male comes in for evaluation of constant throat drainage and clearing. He does not feel like it is coming down from his throat. He saw Dr. Janace Hoard 10 years ago who recommended aggressive treatment for symptomatic reflux.  At that time, he was on Nexium morning and evening, and Zantac at bedtime. Now, he is taking Nexium once at bedtime. He has some hoarseness. Occasionally he will awaken at night with what sounds like laryngospasm. No swallowing difficulty. He does not smoke.  What little material he can produce with coughing is always white.   He snores very heavily. He has been diagnosed with obstructive sleep apnea on a sleep study maybe 7 years ago.  He was unable to tolerate the CPAP mask even in the sleep lab.  He sleeps maybe 6 hours per night. He is not using sedatives or caffeine. He claims he is not falling asleep appropriately, but wife reports that he is very quick to fall asleep when he chooses to do so.   He has controlled hypertension. No morning headaches.   He had a recent MRI scan which was read as showing sinus disease. He is here for further consideration  I called to discuss the results of the sleep study done last week. he had an apnea-hypopnea index of 37 per hour.  minimal oxygen desaturation.  he has not yet tried the aspirin trial to see if this helps with his breathing and sleep quality. He is receptive to an attempt at CPAP .   We will set him up with an auto- titration unit.  he will try this for 4-6 weeks and let us know how he is doing. If this is not successful, then I think he is a candidate for septoplasty, reduction of turbinates, and possibly a full formal UPPP. I did mention weight loss once again. He will call us after the Afrin trial.  Received C. complaints report.  He is wearing it on average almost 4 hours every night.  pressure between 8 and  11 cm water.  He felt like the Afrin did help open his nose.  she is not convinced that the CPAP is doing very much, but his wife feels like he is snoring last, and falling asleep inappropriately less often. He  is only sleeping 5 maybe 6 hours per night.  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   I emphasized that I thought he needed more sleep overall, and he needs to wear his CPAP more as well.  I reassured him that if he wanted to use Afrin on occasional evenings, this was probably safe enough.  we talked about weight loss once again.  I am not positive that we could necessarily clear his sleep apnea with surgery.  At this point, I do not think we have an adequate CPAP failure to recommend going ahead to surgery.  He understands and agrees.  6 months recheck.  TSH was normal.  He is using his auto titration of CPAP some more LEFT than 6 hours per night.  He does not snore when he is using CPAP, but does not necessarily feel like his risk while he is much better.  Afrin spray did help before CPAP with the snoring and the sleep quality.   He is using Nexium at bedtime for known reflux.  I recommend he  moves this to before dinner, and take a second dose before breakfast for the next month to see if this helps.  Preoperative visit.  He is now wearing his CPAP quite successfully and sleeping 7-8 hours per night.  He clearly recognizes an improvement in his rest quality.  His recent sleep study showed an apnea hypopnea index of 37 per hour.  He is hoping we may be it would to get off the CPAP altogether.   We are preparing to do a septoplasty and reduction of turbinates.  Because of his sleep apnea, he will stay overnight in the hospital one night and we will remove his packs the following morning.  I discussed the surgery in detail including risks and complications.  Questions were answered and informed consent was obtained.  A routine preoperative history and physical was  recorded without contraindications.  I gave him prescriptions for Keflex and Tylenol #3.  I discussed nasal hygiene measures, advancement of diet and activity, and return to work.  I will see him back here 10 days postop for removal of nasal septal splints.  PMH: Past Medical History  Diagnosis Date  . Hypertension   . GERD (gastroesophageal reflux disease)   . Obesity   . Hyperlipidemia   . H/O echocardiogram     Echo 11/11: EF 67-34%, grade 1 diastolic dysfunction, mild LAE  . H/O cardiovascular stress test     ETT-Myoview 11/11: Inferior fixed defect likely due to diaphragmatic attenuation with normal wall motion, EF 69%  . Mini stroke   . Stroke 10  . Sleep apnea     cpap 1 yr  . Pneumonia     hx  . Diabetes mellitus     denies  . Anxiety   . H/O hiatal hernia   . Arthritis     Surg Hx: Past Surgical History  Procedure Laterality Date  . Knee surgery Left     arthroscopy  . Bunionectomy    . Tonsillectomy    . Hydrocell removal    . Foot surgery Left     bunion    FHx:   Family History  Problem Relation Age of Onset  . Heart attack Mother   . Prostate cancer Father   . Heart disease Other     paternal grandparents  . Alzheimer's disease Maternal Grandmother    SocHx:  reports that he quit smoking about 29 years ago. His smoking use included Cigarettes. He has a 70 pack-year smoking history. He does not have any smokeless tobacco history on file. He reports that he does not drink alcohol or use illicit drugs.  ALLERGIES:  Allergies  Allergen Reactions  . Bee Venom   . Hydrocodone-Acetaminophen     REACTION: rash     (Not in a hospital admission)  Results for orders placed during the hospital encounter of 09/06/14 (from the past 48 hour(s))  BASIC METABOLIC PANEL     Status: Abnormal   Collection Time    09/06/14  9:38 AM      Result Value Ref Range   Sodium 141  137 - 147 mEq/L   Potassium 3.8  3.7 - 5.3 mEq/L   Chloride 101  96 - 112 mEq/L   CO2  28  19 - 32 mEq/L   Glucose, Bld 110 (*) 70 - 99 mg/dL   BUN 19  6 - 23 mg/dL   Creatinine, Ser 1.05  0.50 - 1.35 mg/dL   Calcium 9.5  8.4 - 10.5 mg/dL  GFR calc non Af Amer 74 (*) >90 mL/min   GFR calc Af Amer 85 (*) >90 mL/min   Comment: (NOTE)     The eGFR has been calculated using the CKD EPI equation.     This calculation has not been validated in all clinical situations.     eGFR's persistently <90 mL/min signify possible Chronic Kidney     Disease.   Anion gap 12  5 - 15  CBC     Status: None   Collection Time    09/06/14  9:38 AM      Result Value Ref Range   WBC 7.9  4.0 - 10.5 K/uL   RBC 5.63  4.22 - 5.81 MIL/uL   Hemoglobin 16.0  13.0 - 17.0 g/dL   HCT 47.4  39.0 - 52.0 %   MCV 84.2  78.0 - 100.0 fL   MCH 28.4  26.0 - 34.0 pg   MCHC 33.8  30.0 - 36.0 g/dL   RDW 13.0  11.5 - 15.5 %   Platelets 227  150 - 400 K/uL   Dg Chest 2 View  09/06/2014   CLINICAL DATA:  Preop for surgery for deviated nasal septum, some shortness of breath with exertion, former smoking history  EXAM: CHEST  2 VIEW  COMPARISON:  Chest x-ray of 07/28/2012  FINDINGS: The lungs are clear and slightly hyperaerated. Mediastinal and hilar contours are unremarkable. The heart is within normal limits in size. There are degenerative changes throughout the mid to lower thoracic spine.  IMPRESSION: Slight hyper aeration.  No active lung disease.   Electronically Signed   By: Ivar Drape M.D.   On: 09/06/2014 12:05    TKZ:SWFUXNAT: Feeling tired (fatigue).  No fever.  Night sweats.  No recent weight loss. Head: Headache. Eyes: No eye symptoms. Otolaryngeal: Hearing loss.  No earache.  Tinnitus.  No purulent nasal discharge.  Nasal passage blockage (stuffiness)  and snoring.  No sneezing, no hoarseness, and no sore throat. Cardiovascular: No chest pain or discomfort  and no palpitations. Pulmonary: No dyspnea.  Cough  and wheezing. Gastrointestinal: No dysphagia.  Heartburn.  No nausea, no abdominal pain, and  no melena.  No diarrhea. Genitourinary: No dysuria. Endocrine: No muscle weakness. Musculoskeletal: No calf muscle cramps  and no arthralgias.  Soft tissue swelling. Neurological: No dizziness, no fainting, no tingling, and no numbness. Psychological: Anxiety  and depression. Skin: No rash.  BP:115/80,  HR: 68 b/min,  Height: 74.5 in, Weight: 299 lb, BMI: 37.9 kg/m2,   PHYSICAL EXAM:  He remains heavyset.  Mental status is sharp.  He hears well in conversational speech.  Voice is clear and respirations unlabored mostly through the mouth.  The head is atraumatic and neck supple.  Cranial nerves intact.  Ears are clear.  Anterior nose shows a severe anterior leftward septal deviation with maxillary crest spurring and bulky turbinates on the RIGHT.  No polyps or active drainage.  Oral cavity is clear with teeth in good repair.  Tongue is somewhat bulky.  The soft palate is long and thick including the uvula.  Neck is muscular without adenopathy.   Lungs: Clear to auscultation Heart: Regular rate and rhythm without murmurs Abdomen: Soft, active Extremities: Normal configuration Neurologic: Symmetric, grossly intact.   Assessment/Plan Obesity (BMI 30-39.9) (278.00) (E66.9). Obstructive sleep apnea, adult (327.23) (G47.33). Gastroesophageal reflux disease (530.81) (K21.9). Deviated nasal septum (470) (J34.2). 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  We are doing your surgery next week.  you  will stay overnight one night.  We will remove the nasal packs the following morning before you go home.  Begin using our  nasal hygiene measures as soon as you get home.  We will give you an ice pack for the first 24 hours which is optional at present.  I do recommend that you sleep  propped up for 3 or 4 nights.  you will not breathe through the nose very well for several weeks.  you will not be able to use the CPAP right away.  I will see you back here 10 days after surgery for nasal septal  splint removal.  Diet as comfortable.  No strenuous activities for 2 weeks.  Cephalexin 500 MG Oral Capsule;TAKE 1 CAPSULE 4 TIMES DAILY; Qty40; R0; Rx. Acetaminophen-Codeine #3 300-30 MG Oral Tablet;TAKE 1 TO 2 TABLETS EVERY 4 TO 6 HOURS AS   Erik Obey, Lynard Postlewait 23/03/1442, 6:04 PM

## 2014-09-07 MED ORDER — DEXTROSE 5 % IV SOLN
3.0000 g | INTRAVENOUS | Status: AC
  Administered 2014-09-08: 3 g via INTRAVENOUS
  Filled 2014-09-07: qty 3000

## 2014-09-07 NOTE — Progress Notes (Signed)
Anesthesia Chart Review:  Patient is a 63 year old male scheduled for nasal septoplasty with turbinate reduction on 62/1/30 by Dr. Erik Obey.  History includes former smoker, HTN, HLD, GERD, hiatal hernia, CVA '10, OSA (Dr. Gwenette Greet), anxiety, tonsillectomy.  DM is listed but he denied history. BMI is consistent with obesity. He saw Dr. Acie Fredrickson in the fall of 2013 for chest tightness and had a normal stress test. PCP is listed as Dr. Burnard Bunting.  EKG on 09/06/14 showed: NSR, non-specific T wave abnormality. There is borderline RAD.  EKG was not felt significantly changed since previous tracings (08/06/12 in [facility] and 08/11/12 in [facility]).   Nuclear stress test on 08/06/12 showed: Overall Impression: Normal stress nuclear study. LV Ejection Fraction: 68%. LV Wall Motion: NL LV Function; NL Wall Motion.  Echo on 10/11/10 showed: - Left ventricle: The cavity size was normal. Wall thickness was normal. Systolic function was normal. The estimated ejection fraction was in the range of 60% to 65%. Wall motion was normal; there were no regional wall motion abnormalities. Doppler parameters are consistent with abnormal left ventricular relaxation (grade 1 diastolic dysfunction). - Left atrium: The atrium was mildly dilated.  CXR on 09/06/14 showed: Slight hyper aeration. No active lung disease.  Preoperative labs noted. Glucose 110. Cr 1.05. CBC WNL.  Patient with stable EKG and normal stress test just over two years ago.  Although he has some CAD risk factors, he does not carry a diagnosis of CAD/MI or CHF.  No CV symptoms were documented at his PAT visit.  He will be further evaluated by his assigned anesthesiologist on the day of surgery.  If no acute changes or new CV symptoms then I would anticipate that he could proceed as planned.  George Hugh [facility] Short Stay Center/Anesthesiology Phone [phone] 09/07/2014 10:24 AM

## 2014-09-08 ENCOUNTER — Encounter (HOSPITAL_COMMUNITY)
Admission: RE | Disposition: A | Discharge status: Home / Self Care | Payer: Self-pay | Source: Ambulatory Visit | Attending: Otolaryngology

## 2014-09-08 ENCOUNTER — Observation Stay (HOSPITAL_COMMUNITY)
Admission: RE | Admit: 2014-09-08 | Discharge: 2014-09-09 | Disposition: A | Discharge status: Home / Self Care | Payer: 59 | Source: Ambulatory Visit | Attending: Otolaryngology | Admitting: Otolaryngology

## 2014-09-08 ENCOUNTER — Ambulatory Visit (HOSPITAL_COMMUNITY): Payer: 59 | Admitting: Certified Registered"

## 2014-09-08 ENCOUNTER — Encounter (HOSPITAL_COMMUNITY): Payer: Self-pay | Admitting: *Deleted

## 2014-09-08 ENCOUNTER — Encounter (HOSPITAL_COMMUNITY): Payer: 59 | Admitting: Vascular Surgery

## 2014-09-08 DIAGNOSIS — I10 Essential (primary) hypertension: Secondary | ICD-10-CM | POA: Insufficient documentation

## 2014-09-08 DIAGNOSIS — E785 Hyperlipidemia, unspecified: Secondary | ICD-10-CM | POA: Insufficient documentation

## 2014-09-08 DIAGNOSIS — J342 Deviated nasal septum: Secondary | ICD-10-CM | POA: Diagnosis not present

## 2014-09-08 DIAGNOSIS — Z8673 Personal history of transient ischemic attack (TIA), and cerebral infarction without residual deficits: Secondary | ICD-10-CM | POA: Insufficient documentation

## 2014-09-08 DIAGNOSIS — K219 Gastro-esophageal reflux disease without esophagitis: Secondary | ICD-10-CM | POA: Insufficient documentation

## 2014-09-08 DIAGNOSIS — G4733 Obstructive sleep apnea (adult) (pediatric): Secondary | ICD-10-CM | POA: Insufficient documentation

## 2014-09-08 DIAGNOSIS — Z87891 Personal history of nicotine dependence: Secondary | ICD-10-CM | POA: Insufficient documentation

## 2014-09-08 DIAGNOSIS — Z885 Allergy status to narcotic agent status: Secondary | ICD-10-CM | POA: Insufficient documentation

## 2014-09-08 DIAGNOSIS — M1990 Unspecified osteoarthritis, unspecified site: Secondary | ICD-10-CM | POA: Insufficient documentation

## 2014-09-08 DIAGNOSIS — Z6837 Body mass index (BMI) 37.0-37.9, adult: Secondary | ICD-10-CM | POA: Insufficient documentation

## 2014-09-08 DIAGNOSIS — J343 Hypertrophy of nasal turbinates: Secondary | ICD-10-CM | POA: Insufficient documentation

## 2014-09-08 DIAGNOSIS — Z91030 Bee allergy status: Secondary | ICD-10-CM | POA: Insufficient documentation

## 2014-09-08 HISTORY — PX: NASAL SEPTOPLASTY W/ TURBINOPLASTY: SHX2070

## 2014-09-08 LAB — GLUCOSE, CAPILLARY: Glucose-Capillary: 101 mg/dL — ABNORMAL HIGH (ref 70–99)

## 2014-09-08 SURGERY — SEPTOPLASTY, NOSE, WITH NASAL TURBINATE REDUCTION
Anesthesia: General

## 2014-09-08 MED ORDER — 0.9 % SODIUM CHLORIDE (POUR BTL) OPTIME
TOPICAL | Status: DC | PRN
  Administered 2014-09-08: 1000 mL

## 2014-09-08 MED ORDER — DEXAMETHASONE SODIUM PHOSPHATE 10 MG/ML IJ SOLN
INTRAMUSCULAR | Status: DC | PRN
  Administered 2014-09-08: 10 mg via INTRAVENOUS

## 2014-09-08 MED ORDER — OXYMETAZOLINE HCL 0.05 % NA SOLN
NASAL | Status: AC
  Filled 2014-09-08: qty 15

## 2014-09-08 MED ORDER — ESCITALOPRAM OXALATE 10 MG PO TABS
10.0000 mg | ORAL_TABLET | Freq: Every day | ORAL | Status: DC
  Filled 2014-09-08: qty 1

## 2014-09-08 MED ORDER — PROMETHAZINE HCL 25 MG RE SUPP: 25.0000 mg | Freq: Four times a day (QID) | RECTAL | Status: DC | PRN

## 2014-09-08 MED ORDER — MEPERIDINE HCL 25 MG/ML IJ SOLN: 6.2500 mg | INTRAMUSCULAR | Status: DC | PRN

## 2014-09-08 MED ORDER — MORPHINE SULFATE 2 MG/ML IJ SOLN: 1.0000 mg | INTRAMUSCULAR | Status: DC | PRN

## 2014-09-08 MED ORDER — HYDROMORPHONE HCL 1 MG/ML IJ SOLN: 0.2500 mg | INTRAMUSCULAR | Status: DC | PRN

## 2014-09-08 MED ORDER — CEPHALEXIN 250 MG/5ML PO SUSR
500.0000 mg | Freq: Four times a day (QID) | ORAL | Status: DC
  Administered 2014-09-08 – 2014-09-09 (×2): 500 mg via ORAL
  Filled 2014-09-08 (×6): qty 10

## 2014-09-08 MED ORDER — OXYCODONE HCL 5 MG/5ML PO SOLN: 5.0000 mg | Freq: Once | ORAL | Status: DC | PRN

## 2014-09-08 MED ORDER — TRIAMTERENE-HCTZ 37.5-25 MG PO TABS
1.0000 | ORAL_TABLET | Freq: Every day | ORAL | Status: DC
  Administered 2014-09-08: 1 via ORAL
  Filled 2014-09-08 (×2): qty 1

## 2014-09-08 MED ORDER — SUCCINYLCHOLINE CHLORIDE 20 MG/ML IJ SOLN
INTRAMUSCULAR | Status: DC | PRN
  Administered 2014-09-08: 100 mg via INTRAVENOUS

## 2014-09-08 MED ORDER — PHENYLEPHRINE 40 MCG/ML (10ML) SYRINGE FOR IV PUSH (FOR BLOOD PRESSURE SUPPORT)
PREFILLED_SYRINGE | INTRAVENOUS | Status: AC
  Filled 2014-09-08: qty 10

## 2014-09-08 MED ORDER — FENTANYL CITRATE 0.05 MG/ML IJ SOLN
INTRAMUSCULAR | Status: AC
  Filled 2014-09-08: qty 5

## 2014-09-08 MED ORDER — LIDOCAINE HCL (CARDIAC) 20 MG/ML IV SOLN
INTRAVENOUS | Status: DC | PRN
  Administered 2014-09-08: 60 mg via INTRATRACHEAL
  Administered 2014-09-08: 70 mg via INTRAVENOUS

## 2014-09-08 MED ORDER — PANTOPRAZOLE SODIUM 40 MG PO TBEC: 40.0000 mg | DELAYED_RELEASE_TABLET | Freq: Every day | ORAL | Status: DC

## 2014-09-08 MED ORDER — EPHEDRINE SULFATE 50 MG/ML IJ SOLN
INTRAMUSCULAR | Status: DC | PRN
  Administered 2014-09-08: 10 mg via INTRAVENOUS

## 2014-09-08 MED ORDER — FENTANYL CITRATE 0.05 MG/ML IJ SOLN
INTRAMUSCULAR | Status: DC | PRN
  Administered 2014-09-08: 100 ug via INTRAVENOUS

## 2014-09-08 MED ORDER — COCAINE HCL 4 % EX SOLN
CUTANEOUS | Status: AC
  Filled 2014-09-08: qty 4

## 2014-09-08 MED ORDER — IRBESARTAN 75 MG PO TABS
75.0000 mg | ORAL_TABLET | Freq: Every day | ORAL | Status: DC
  Administered 2014-09-08: 75 mg via ORAL
  Filled 2014-09-08 (×2): qty 1

## 2014-09-08 MED ORDER — SCOPOLAMINE 1 MG/3DAYS TD PT72
1.0000 | MEDICATED_PATCH | Freq: Once | TRANSDERMAL | Status: AC
  Administered 2014-09-08: 1 via TRANSDERMAL

## 2014-09-08 MED ORDER — PROPOFOL 10 MG/ML IV BOLUS
INTRAVENOUS | Status: AC
  Filled 2014-09-08: qty 20

## 2014-09-08 MED ORDER — ACETAMINOPHEN-CODEINE #3 300-30 MG PO TABS
1.0000 | ORAL_TABLET | ORAL | Status: DC | PRN
  Administered 2014-09-09: 1 via ORAL
  Filled 2014-09-08: qty 1

## 2014-09-08 MED ORDER — BACITRACIN ZINC 500 UNIT/GM EX OINT
TOPICAL_OINTMENT | CUTANEOUS | Status: AC
  Filled 2014-09-08: qty 15

## 2014-09-08 MED ORDER — LIDOCAINE-EPINEPHRINE 1 %-1:100000 IJ SOLN
INTRAMUSCULAR | Status: DC | PRN
  Administered 2014-09-08: 10 mL via INTRADERMAL

## 2014-09-08 MED ORDER — ONDANSETRON HCL 4 MG/2ML IJ SOLN
INTRAMUSCULAR | Status: AC
  Filled 2014-09-08: qty 2

## 2014-09-08 MED ORDER — LIDOCAINE HCL (CARDIAC) 20 MG/ML IV SOLN
INTRAVENOUS | Status: AC
  Filled 2014-09-08: qty 5

## 2014-09-08 MED ORDER — SCOPOLAMINE 1 MG/3DAYS TD PT72
MEDICATED_PATCH | TRANSDERMAL | Status: AC
Start: 2014-09-08 — End: 2014-09-08
  Filled 2014-09-08: qty 1

## 2014-09-08 MED ORDER — MIDAZOLAM HCL 2 MG/2ML IJ SOLN
INTRAMUSCULAR | Status: AC
  Filled 2014-09-08: qty 2

## 2014-09-08 MED ORDER — EPHEDRINE SULFATE 50 MG/ML IJ SOLN
INTRAMUSCULAR | Status: AC
  Filled 2014-09-08: qty 1

## 2014-09-08 MED ORDER — DEXAMETHASONE SODIUM PHOSPHATE 10 MG/ML IJ SOLN
INTRAMUSCULAR | Status: AC
  Filled 2014-09-08: qty 1

## 2014-09-08 MED ORDER — ONDANSETRON HCL 4 MG/2ML IJ SOLN
INTRAMUSCULAR | Status: DC | PRN
  Administered 2014-09-08: 4 mg via INTRAVENOUS

## 2014-09-08 MED ORDER — MIDAZOLAM HCL 5 MG/5ML IJ SOLN
INTRAMUSCULAR | Status: DC | PRN
  Administered 2014-09-08: 2 mg via INTRAVENOUS

## 2014-09-08 MED ORDER — SODIUM CHLORIDE 0.9 % IJ SOLN
INTRAMUSCULAR | Status: AC
  Filled 2014-09-08: qty 10

## 2014-09-08 MED ORDER — LACTATED RINGERS IV SOLN
INTRAVENOUS | Status: DC
  Administered 2014-09-08 (×2): via INTRAVENOUS

## 2014-09-08 MED ORDER — PROMETHAZINE HCL 25 MG PO TABS
25.0000 mg | ORAL_TABLET | Freq: Four times a day (QID) | ORAL | Status: DC | PRN
Start: 2014-09-08 — End: 2014-09-09
  Administered 2014-09-08: 25 mg via ORAL
  Filled 2014-09-08: qty 1

## 2014-09-08 MED ORDER — MIDAZOLAM HCL 2 MG/2ML IJ SOLN
0.5000 mg | Freq: Once | INTRAMUSCULAR | Status: DC | PRN
Start: 2014-09-08 — End: 2014-09-08

## 2014-09-08 MED ORDER — PROMETHAZINE HCL 25 MG/ML IJ SOLN: 6.2500 mg | INTRAMUSCULAR | Status: DC | PRN

## 2014-09-08 MED ORDER — DEXTROSE-NACL 5-0.45 % IV SOLN
INTRAVENOUS | Status: DC
  Administered 2014-09-08: 19:00:00 via INTRAVENOUS

## 2014-09-08 MED ORDER — OXYCODONE HCL 5 MG PO TABS: 5.0000 mg | ORAL_TABLET | Freq: Once | ORAL | Status: DC | PRN

## 2014-09-08 MED ORDER — PANTOPRAZOLE SODIUM 40 MG IV SOLR
40.0000 mg | Freq: Once | INTRAVENOUS | Status: AC
  Administered 2014-09-08: 40 mg via INTRAVENOUS
  Filled 2014-09-08 (×2): qty 40

## 2014-09-08 MED ORDER — LIDOCAINE-EPINEPHRINE 1 %-1:100000 IJ SOLN
INTRAMUSCULAR | Status: AC
  Filled 2014-09-08: qty 1

## 2014-09-08 MED ORDER — ROCURONIUM BROMIDE 50 MG/5ML IV SOLN
INTRAVENOUS | Status: AC
  Filled 2014-09-08: qty 1

## 2014-09-08 MED ORDER — OXYMETAZOLINE HCL 0.05 % NA SOLN
2.0000 | NASAL | Status: AC
  Administered 2014-09-08 (×2): 2 via NASAL
  Filled 2014-09-08: qty 15

## 2014-09-08 MED ORDER — OXYMETAZOLINE HCL 0.05 % NA SOLN
NASAL | Status: DC | PRN
  Administered 2014-09-08: 1 via NASAL

## 2014-09-08 MED ORDER — DIPHENHYDRAMINE HCL 50 MG/ML IJ SOLN
INTRAMUSCULAR | Status: DC | PRN
  Administered 2014-09-08: 12.5 mg via INTRAVENOUS

## 2014-09-08 MED ORDER — DIPHENHYDRAMINE HCL 50 MG/ML IJ SOLN
INTRAMUSCULAR | Status: AC
  Filled 2014-09-08: qty 1

## 2014-09-08 MED ORDER — SUCCINYLCHOLINE CHLORIDE 20 MG/ML IJ SOLN
INTRAMUSCULAR | Status: AC
  Filled 2014-09-08: qty 1

## 2014-09-08 MED ORDER — PROPOFOL 10 MG/ML IV BOLUS
INTRAVENOUS | Status: DC | PRN
  Administered 2014-09-08: 40 mg via INTRAVENOUS
  Administered 2014-09-08: 200 mg via INTRAVENOUS

## 2014-09-08 SURGICAL SUPPLY — 40 items
ATTRACTOMAT 16X20 MAGNETIC DRP (DRAPES) ×3 IMPLANT
BLADE TRICUT ROTATE M4 4 5PK (BLADE) IMPLANT
BLADE TRICUT ROTATE M4 4MM 5PK (BLADE)
CANISTER SUCTION 2500CC (MISCELLANEOUS) ×3 IMPLANT
COAGULATOR SUCT 6 FR SWTCH (ELECTROSURGICAL) ×1
COAGULATOR SUCT SWTCH 10FR 6 (ELECTROSURGICAL) ×2 IMPLANT
CRADLE DONUT ADULT HEAD (MISCELLANEOUS) IMPLANT
DRESSING TELFA 8X10 (GAUZE/BANDAGES/DRESSINGS) IMPLANT
DRSG NASOPORE 8CM (GAUZE/BANDAGES/DRESSINGS) IMPLANT
ELECT REM PT RETURN 9FT ADLT (ELECTROSURGICAL) ×3
ELECTRODE REM PT RTRN 9FT ADLT (ELECTROSURGICAL) ×1 IMPLANT
FILTER ARTHROSCOPY CONVERTOR (FILTER) IMPLANT
GAUZE PACKING FOLDED 2  STR (GAUZE/BANDAGES/DRESSINGS) ×2
GAUZE PACKING FOLDED 2 STR (GAUZE/BANDAGES/DRESSINGS) ×1 IMPLANT
GAUZE SPONGE 2X2 8PLY STRL LF (GAUZE/BANDAGES/DRESSINGS) IMPLANT
GEL ULTRASOUND 20GR AQUASONIC (MISCELLANEOUS) ×3 IMPLANT
GLOVE ECLIPSE 8.0 STRL XLNG CF (GLOVE) ×3 IMPLANT
GOWN STRL REUS W/ TWL LRG LVL3 (GOWN DISPOSABLE) ×1 IMPLANT
GOWN STRL REUS W/ TWL XL LVL3 (GOWN DISPOSABLE) ×1 IMPLANT
GOWN STRL REUS W/TWL LRG LVL3 (GOWN DISPOSABLE) ×3
GOWN STRL REUS W/TWL XL LVL3 (GOWN DISPOSABLE) ×3
KIT BASIN OR (CUSTOM PROCEDURE TRAY) ×3 IMPLANT
KIT ROOM TURNOVER OR (KITS) ×3 IMPLANT
NDL SPNL 25GX3.5 QUINCKE BL (NEEDLE) ×1 IMPLANT
NDL STRAIGHT KEITH (NEEDLE) IMPLANT
NEEDLE SPNL 25GX3.5 QUINCKE BL (NEEDLE) ×3 IMPLANT
NEEDLE STRAIGHT KEITH (NEEDLE) ×3 IMPLANT
NS IRRIG 1000ML POUR BTL (IV SOLUTION) ×3 IMPLANT
PAD ARMBOARD 7.5X6 YLW CONV (MISCELLANEOUS) ×6 IMPLANT
PATTIES SURGICAL .5 X3 (DISPOSABLE) ×3 IMPLANT
SHEET SIL 040 (INSTRUMENTS) ×5 IMPLANT
SPECIMEN JAR SMALL (MISCELLANEOUS) IMPLANT
SPONGE GAUZE 2X2 STER 10/PKG (GAUZE/BANDAGES/DRESSINGS)
SUT CHROMIC 4 0 P 3 18 (SUTURE) ×3 IMPLANT
SUT CHROMIC GUT 2 0 PS 2 27 (SUTURE) ×2 IMPLANT
SUT ETHILON 3 0 PS 1 (SUTURE) ×3 IMPLANT
SUT PDS AB 4-0 P3 18 (SUTURE) ×3 IMPLANT
SUT PLAIN 4 0 ~~LOC~~ 1 (SUTURE) ×2 IMPLANT
TRAY ENT MC OR (CUSTOM PROCEDURE TRAY) ×3 IMPLANT
WATER STERILE IRR 1000ML POUR (IV SOLUTION) ×3 IMPLANT

## 2014-09-08 NOTE — Discharge Instructions (Signed)
Septoplasty Care After Refer to this sheet in the next few weeks. These instructions provide you with information on caring for yourself after your procedure. Your caregiver may also give you more specific instructions. Your treatment has been planned according to current medical practices, but problems sometimes occur. Call your caregiver if you have any problems or questions after your procedure. HOME CARE INSTRUCTIONS  If antibiotic medicines are prescribed, take them as directed. Finish them even if you start to feel better.  Only take over-the-counter or prescription medicines for pain, discomfort, or fever as directed by your caregiver.  You may clean your nostrils with an over-the-counter nasal saline spray. This will help clear the crusts and blood clots in your nose. You can also make your own saline solution by mixing the following:   tsp of salt.   tsp of baking soda.  6 oz of water. If the saline solution is too irritating, you can add more water.  Do not blow your nose for 2 weeks after surgery.  Avoid strenuous activity such as running or playing sports for 2 weeks. This can cause nosebleeds.  Do not lift anything heavier than 10 pounds (4.5 kg) for 2 weeks, or as directed by your caregiver.  Use a couple pillows to elevate your head when lying down.  Eat plenty of fiber to keep your stools soft, especially while taking pain medicine. This avoids straining, which can cause a nosebleed.  You may take laxatives or stool softeners as directed by your caregiver.  Keep all follow-up appointments as directed by your caregiver. If you have nasal splints, they will be removed about 1 week after surgery. SEEK MEDICAL CARE IF:  You develop redness, swelling, or increasing pain in your nose.  You have yellowish-white fluid (pus) coming from your nose.  You have a severe headache or stiff neck.  You have severe diarrhea or persistent nausea and vomiting.  You cannot  breathe through your nose.  You have any questions or concerns. SEEK IMMEDIATE MEDICAL CARE IF:   You have a rash or upset stomach.  You have a fever.  You are short of breath.  You develop any reaction or side effects to your medicines.  You feel dizzy, or you faint.  You have vision changes or swollen eyes.  You are bleeding heavily from the nose. MAKE SURE YOU:  Understand these instructions.  Will watch your condition.  Will get help right away if you are not doing well or get worse. Document Released: 11/18/2005 Document Revised: 02/10/2012 Document Reviewed: 12/30/2011 [facility] Patient Information 2015 [place], [place]. This information is not intended to replace advice given to you by your health care provider. Make sure you discuss any questions you have with your health care provider.  Use saline nasal spray very frequently while awake (1-2 x/hr) OK to rinse throat with cool dilute salt water to clear old blood and thick phlegm No strenuous activity x 2 weeks Diet as comfortable. Call for problems or questions [phone])

## 2014-09-08 NOTE — Op Note (Signed)
09/08/2014  12:24 PM    Matthew Love  [id number]   Pre-Op Dx:  Deviated Nasal Septum, Hypertrophic Inferior Turbinates  Post-op Dx: Same  Proc: Nasal Septoplasty, Bilateral SMR Inferior Turbinates   Surg:  [name] T MD  Anes:  GOT  EBL:  25 ml  Comp:   none  Findings:  Very heavy and buckled anterior septal cartilage. LEFT maxillary crest spur.  Septum anteriorly to the LEFT, high to the RIGHT superiorly.   Procedure: With the patient in a comfortable supine position,  general orotracheal anesthesia was induced without difficulty.     The patient received preoperative Afrin spray for topical decongestion and vasoconstriction.  Intravenous prophylactic antibiotics were administered.  At an appropriate level, the patient was placed in a semi-sitting position.  A saline moistened throat pack was placed.  Nasal vibrissae were trimmed. Afrin solution was applied on 0.5" x 3" cottonoids to both sides of the septal mucosa.   1% Xylocaine with 1:100,000 epinephrine, 10 cc's, was infiltrated into the anterior floor of the nose, into the nasal spine region, into the membranous columella, and finally into the submucoperichondrial plane of the septum on both sides.  Several minutes were allowed for this to take effect.  A sterile preparation and draping of the midface was accomplished in the standard fashion.  The materials were removed from the nose and observed to be intact and correct in number.  The nose was inspected with a headlight with the findings as described above.  A RIGHT hemitransfixion incision was sharply executed and carried down to the caudal edge of the quadrangular cartilage and continued to a floor incision.  An opposite small floor incision was sharply executed as well.   Floor tunnels were elevated on both sides, carried posteriorly, then medially, then brought forward along the vomer and maxillary crest.  The submucoperichondrial plane of the  RIGHT septum  was dissected up to the dorsum of the nose, back onto the perpendicular plate, and brought down and communicated with a floor tunnel and then forward along the maxillary crest.  The was substantial fibrosis of the mucoperichondrium to the septum, with several large rents in the RIGHT flap generated.  The chondroethmoid junction was identified and opened with a Psychologist, educational.  The opposite submucoperiosteal plane of the perpendicular plate of the ethmoid  was elevated and carried down to the floor tunnel posteriorly.  The superior perpendicular plate was lysed with an open Jansen-Middleton forceps.  The inferior portion was dissected from the maxillary crest and vomer with a Cottle elevator.  The midportion was rocked free with a closed KeySpan forceps and then delivered.    The posterior inferior corner of the quadrangular cartilage was submucosally resected, including a cartilaginous tail up along the vomer.   The maxillary crest and vomer were reduced posterior to the caudal strut.  The septum was freed from the upper lateral cartilages sharply.  A 2 mm strip at the base of the caudal strut was submucosally removed where it was spurred into the LEFT side.    After mobilizing the septum adequately, and straightening it in the standard fashion,  The septum was secured to the nasal spine with a figure-of-eight 4-0 PDS suture.  A good straight midline configuration of the septum with good dorsal support was generated.  The septal tunnel was suctioned clear.  Hemostasis was observed.  The flaps were laid back down.  The incisions were closed with interrupted 4-0 chromic suture.  Just prior  to completing the septoplasty, the inferior turbinates were each infiltrated with additional 1% Xylocaine with 1:100,000 epinephrine,  6 cc's total.  Upon completing the septoplasty, beginning on the RIGHT side, the inferior turbinate was inspected and infractured.  The anterior hood of the inferior turbinate  was sharply lysed just behind the nasal valve.  The medial mucosa of the inferior turbinate was incised in an  anterior upsloping fashion and a laterally based flap was developed from the turbinate bone.  Using angled turbinate scissors, turbinate bone and lateral mucosa were resected in a posterior downsloping fashion, taking much of the anterior pole and leaving most of the posterior pole.  Bony spicules were submucosally dissected and removed.  The mucosal flap was laid back down and the turbinate was outfractured.  This completed one SMR inferior turbinate.  The opposite side was performed in identical fashion.  Suction cautery was used on the cut mucosal edges and the bulbous posterior pole.    Again hemostasis was observed.  After completing both turbinate resections, 1.04 mm  Silastic splints were fashioned, placed against the nasal septum for support, and secured thereto with a 3-0 Ethilon stitch.   Telfa packs impregnated with bacitracin ointment were placed between the septum and the inferior turbinates, one on each side, for hemostasis and support.  A 6.0 mm nasal trumpet was shortened to reach the nasopharynx and placed parallel to the packs in both sides of the nose.   At this point the procedure was completed.  The pharynx was suctioned free and the throat pack was removed.   The patient was returned to anesthesia, awakened, extubated, and transferred to recovery in stable condition.  Dispo:   PACU to home  Plan: Ice, elevation, narcotic analgesia, prophylactic antibiotics for the duration of indwelling nasal foreign bodies.  Overnight observation given his known OSA.  We will remove the nasal packing In one day, the septal splints in 11 days.  Return to work or school in 10 days, strenuous activities in two weeks.  Tyson Alias MD

## 2014-09-08 NOTE — Anesthesia Procedure Notes (Signed)
Procedure Name: Intubation Date/Time: 09/08/2014 10:35 AM Performed by: Julian Reil Pre-anesthesia Checklist: Patient identified, Emergency Drugs available, Suction available and Patient being monitored Patient Re-evaluated:Patient Re-evaluated prior to inductionOxygen Delivery Method: Circle system utilized Preoxygenation: Pre-oxygenation with 100% oxygen Intubation Type: IV induction Ventilation: Mask ventilation without difficulty Laryngoscope Size: Mac and 4 Grade View: Grade I Tube type: Oral Tube size: 7.5 mm Number of attempts: 1 Airway Equipment and Method: Stylet and LTA kit utilized Placement Confirmation: ETT inserted through vocal cords under direct vision,  positive ETCO2 and breath sounds checked- equal and bilateral Secured at: 23 cm Tube secured with: Tape Dental Injury: Teeth and Oropharynx as per pre-operative assessment

## 2014-09-08 NOTE — Anesthesia Preprocedure Evaluation (Signed)
Anesthesia Evaluation  Patient identified by MRN, date of birth, ID band Patient awake    Reviewed: Allergy & Precautions, H&P , NPO status , Patient's Chart, lab work & pertinent test results  History of Anesthesia Complications Negative for: history of anesthetic complications  Airway Mallampati: I TM Distance: >3 FB Neck ROM: Full    Dental  (+) Dental Advisory Given   Pulmonary sleep apnea (no CPAP) , former smoker (quit '86),  breath sounds clear to auscultation        Cardiovascular hypertension, Pt. on medications - anginaRhythm:Regular Rate:Normal  Echo 11/11: EF 40-98%, grade 1 diastolic dysfunction, ETT-Myoview 11/11: Inferior fixed defect likely due to diaphragmatic attenuation with normal wall motion, EF 69%   Neuro/Psych TIA   GI/Hepatic Neg liver ROS, GERD-  Medicated and Controlled,  Endo/Other  diabetes (pre-diabetic: diet management, glu 101)Morbid obesity  Renal/GU negative Renal ROS     Musculoskeletal   Abdominal (+) + obese,   Peds  Hematology negative hematology ROS (+)   Anesthesia Other Findings   Reproductive/Obstetrics                           Anesthesia Physical Anesthesia Plan  ASA: III  Anesthesia Plan: General   Post-op Pain Management:    Induction: Intravenous  Airway Management Planned: Oral ETT  Additional Equipment:   Intra-op Plan:   Post-operative Plan: Extubation in OR  Informed Consent: I have reviewed the patients History and Physical, chart, labs and discussed the procedure including the risks, benefits and alternatives for the proposed anesthesia with the patient or authorized representative who has indicated his/her understanding and acceptance.   Dental advisory given  Plan Discussed with: CRNA and Surgeon  Anesthesia Plan Comments: (Plan routine monitors, GETA)        Anesthesia Quick Evaluation

## 2014-09-08 NOTE — Transfer of Care (Signed)
Immediate Anesthesia Transfer of Care Note  Patient: Matthew Love  Procedure(s) Performed: Procedure(s): NASAL SEPTOPLASTY WITH TURBINATE REDUCTION (N/A)  Patient Location: PACU  Anesthesia Type:General  Level of Consciousness: awake  Airway & Oxygen Therapy: Patient Spontanous Breathing and Patient connected to face mask oxygen  Post-op Assessment: Report given to PACU RN, Post -op Vital signs reviewed and stable and Patient moving all extremities  Post vital signs: Reviewed and stable  Complications: No apparent anesthesia complications

## 2014-09-08 NOTE — Interval H&P Note (Signed)
History and Physical Interval Note:  09/08/2014 10:10 AM  Matthew Love  has presented today for surgery, with the diagnosis of deviated nasal septum, hypertrophic turbinate obstructive sleep apnea  The various methods of treatment have been discussed with the patient and family. After consideration of risks, benefits and other options for treatment, the patient has consented to  Procedure(s): NASAL SEPTOPLASTY WITH TURBINATE REDUCTION (N/A) as a surgical intervention .  The patient's history has been re-reviewed, patient re-examined, no change in status, stable for surgery.  I have re-reviewed the patient's chart and labs.  Questions were answered to the patient's satisfaction.     [name]

## 2014-09-08 NOTE — H&P (View-Only) (Signed)
Matthew Love, Matthew Love 63 y.o., male [id number]     Chief Complaint: nasal obstruction, sleep apnea  HPI: 63 year old white male comes in for evaluation of constant throat drainage and clearing. He does not feel like it is coming down from his throat. He saw Dr. Janace Hoard 10 years ago who recommended aggressive treatment for symptomatic reflux.  At that time, he was on Nexium morning and evening, and Zantac at bedtime. Now, he is taking Nexium once at bedtime. He has some hoarseness. Occasionally he will awaken at night with what sounds like laryngospasm. No swallowing difficulty. He does not smoke.  What little material he can produce with coughing is always white.   He snores very heavily. He has been diagnosed with obstructive sleep apnea on a sleep study maybe 7 years ago.  He was unable to tolerate the CPAP mask even in the sleep lab.  He sleeps maybe 6 hours per night. He is not using sedatives or caffeine. He claims he is not falling asleep appropriately, but wife reports that he is very quick to fall asleep when he chooses to do so.   He has controlled hypertension. No morning headaches.   He had a recent MRI scan which was read as showing sinus disease. He is here for further consideration  I called to discuss the results of the sleep study done last week. he had an apnea-hypopnea index of 37 per hour.  minimal oxygen desaturation.  he has not yet tried the aspirin trial to see if this helps with his breathing and sleep quality. He is receptive to an attempt at CPAP .   We will set him up with an auto- titration unit.  he will try this for 4-6 weeks and let us know how he is doing. If this is not successful, then I think he is a candidate for septoplasty, reduction of turbinates, and possibly a full formal UPPP. I did mention weight loss once again. He will call us after the Afrin trial.  Received C. complaints report.  He is wearing it on average almost 4 hours every night.  pressure between 8 and  11 cm water.  He felt like the Afrin did help open his nose.  she is not convinced that the CPAP is doing very much, but his wife feels like he is snoring last, and falling asleep inappropriately less often. He  is only sleeping 5 maybe 6 hours per night.  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   I emphasized that I thought he needed more sleep overall, and he needs to wear his CPAP more as well.  I reassured him that if he wanted to use Afrin on occasional evenings, this was probably safe enough.  we talked about weight loss once again.  I am not positive that we could necessarily clear his sleep apnea with surgery.  At this point, I do not think we have an adequate CPAP failure to recommend going ahead to surgery.  He understands and agrees.  6 months recheck.  TSH was normal.  He is using his auto titration of CPAP some more LEFT than 6 hours per night.  He does not snore when he is using CPAP, but does not necessarily feel like his risk while he is much better.  Afrin spray did help before CPAP with the snoring and the sleep quality.   He is using Nexium at bedtime for known reflux.  I recommend he  moves this to before dinner, and take a second dose before breakfast for the next month to see if this helps.  Preoperative visit.  He is now wearing his CPAP quite successfully and sleeping 7-8 hours per night.  He clearly recognizes an improvement in his rest quality.  His recent sleep study showed an apnea hypopnea index of 37 per hour.  He is hoping we may be it would to get off the CPAP altogether.   We are preparing to do a septoplasty and reduction of turbinates.  Because of his sleep apnea, he will stay overnight in the hospital one night and we will remove his packs the following morning.  I discussed the surgery in detail including risks and complications.  Questions were answered and informed consent was obtained.  A routine preoperative history and physical was  recorded without contraindications.  I gave him prescriptions for Keflex and Tylenol #3.  I discussed nasal hygiene measures, advancement of diet and activity, and return to work.  I will see him back here 10 days postop for removal of nasal septal splints.  PMH: Past Medical History  Diagnosis Date  . Hypertension   . GERD (gastroesophageal reflux disease)   . Obesity   . Hyperlipidemia   . H/O echocardiogram     Echo 11/11: EF 48-54%, grade 1 diastolic dysfunction, mild LAE  . H/O cardiovascular stress test     ETT-Myoview 11/11: Inferior fixed defect likely due to diaphragmatic attenuation with normal wall motion, EF 69%  . Mini stroke   . Stroke 10  . Sleep apnea     cpap 1 yr  . Pneumonia     hx  . Diabetes mellitus     denies  . Anxiety   . H/O hiatal hernia   . Arthritis     Surg Hx: Past Surgical History  Procedure Laterality Date  . Knee surgery Left     arthroscopy  . Bunionectomy    . Tonsillectomy    . Hydrocell removal    . Foot surgery Left     bunion    FHx:   Family History  Problem Relation Age of Onset  . Heart attack Mother   . Prostate cancer Father   . Heart disease Other     paternal grandparents  . Alzheimer's disease Maternal Grandmother    SocHx:  reports that he quit smoking about 29 years ago. His smoking use included Cigarettes. He has a 70 pack-year smoking history. He does not have any smokeless tobacco history on file. He reports that he does not drink alcohol or use illicit drugs.  ALLERGIES:  Allergies  Allergen Reactions  . Bee Venom   . Hydrocodone-Acetaminophen     REACTION: rash     (Not in a hospital admission)  Results for orders placed during the hospital encounter of 09/06/14 (from the past 48 hour(s))  BASIC METABOLIC PANEL     Status: Abnormal   Collection Time    09/06/14  9:38 AM      Result Value Ref Range   Sodium 141  137 - 147 mEq/L   Potassium 3.8  3.7 - 5.3 mEq/L   Chloride 101  96 - 112 mEq/L   CO2  28  19 - 32 mEq/L   Glucose, Bld 110 (*) 70 - 99 mg/dL   BUN 19  6 - 23 mg/dL   Creatinine, Ser 1.05  0.50 - 1.35 mg/dL   Calcium 9.5  8.4 - 10.5 mg/dL  GFR calc non Af Amer 74 (*) >90 mL/min   GFR calc Af Amer 85 (*) >90 mL/min   Comment: (NOTE)     The eGFR has been calculated using the CKD EPI equation.     This calculation has not been validated in all clinical situations.     eGFR's persistently <90 mL/min signify possible Chronic Kidney     Disease.   Anion gap 12  5 - 15  CBC     Status: None   Collection Time    09/06/14  9:38 AM      Result Value Ref Range   WBC 7.9  4.0 - 10.5 K/uL   RBC 5.63  4.22 - 5.81 MIL/uL   Hemoglobin 16.0  13.0 - 17.0 g/dL   HCT 47.4  39.0 - 52.0 %   MCV 84.2  78.0 - 100.0 fL   MCH 28.4  26.0 - 34.0 pg   MCHC 33.8  30.0 - 36.0 g/dL   RDW 13.0  11.5 - 15.5 %   Platelets 227  150 - 400 K/uL   Dg Chest 2 View  09/06/2014   CLINICAL DATA:  Preop for surgery for deviated nasal septum, some shortness of breath with exertion, former smoking history  EXAM: CHEST  2 VIEW  COMPARISON:  Chest x-ray of 07/28/2012  FINDINGS: The lungs are clear and slightly hyperaerated. Mediastinal and hilar contours are unremarkable. The heart is within normal limits in size. There are degenerative changes throughout the mid to lower thoracic spine.  IMPRESSION: Slight hyper aeration.  No active lung disease.   Electronically Signed   By: Ivar Drape M.D.   On: 09/06/2014 12:05    IFO:YDXAJOIN: Feeling tired (fatigue).  No fever.  Night sweats.  No recent weight loss. Head: Headache. Eyes: No eye symptoms. Otolaryngeal: Hearing loss.  No earache.  Tinnitus.  No purulent nasal discharge.  Nasal passage blockage (stuffiness)  and snoring.  No sneezing, no hoarseness, and no sore throat. Cardiovascular: No chest pain or discomfort  and no palpitations. Pulmonary: No dyspnea.  Cough  and wheezing. Gastrointestinal: No dysphagia.  Heartburn.  No nausea, no abdominal pain, and  no melena.  No diarrhea. Genitourinary: No dysuria. Endocrine: No muscle weakness. Musculoskeletal: No calf muscle cramps  and no arthralgias.  Soft tissue swelling. Neurological: No dizziness, no fainting, no tingling, and no numbness. Psychological: Anxiety  and depression. Skin: No rash.  BP:115/80,  HR: 68 b/min,  Height: 74.5 in, Weight: 299 lb, BMI: 37.9 kg/m2,   PHYSICAL EXAM:  He remains heavyset.  Mental status is sharp.  He hears well in conversational speech.  Voice is clear and respirations unlabored mostly through the mouth.  The head is atraumatic and neck supple.  Cranial nerves intact.  Ears are clear.  Anterior nose shows a severe anterior leftward septal deviation with maxillary crest spurring and bulky turbinates on the RIGHT.  No polyps or active drainage.  Oral cavity is clear with teeth in good repair.  Tongue is somewhat bulky.  The soft palate is long and thick including the uvula.  Neck is muscular without adenopathy.   Lungs: Clear to auscultation Heart: Regular rate and rhythm without murmurs Abdomen: Soft, active Extremities: Normal configuration Neurologic: Symmetric, grossly intact.   Assessment/Plan Obesity (BMI 30-39.9) (278.00) (E66.9). Obstructive sleep apnea, adult (327.23) (G47.33). Gastroesophageal reflux disease (530.81) (K21.9). Deviated nasal septum (470) (J34.2). 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  We are doing your surgery next week.  you  will stay overnight one night.  We will remove the nasal packs the following morning before you go home.  Begin using our  nasal hygiene measures as soon as you get home.  We will give you an ice pack for the first 24 hours which is optional at present.  I do recommend that you sleep  propped up for 3 or 4 nights.  you will not breathe through the nose very well for several weeks.  you will not be able to use the CPAP right away.  I will see you back here 10 days after surgery for nasal septal  splint removal.  Diet as comfortable.  No strenuous activities for 2 weeks.  Cephalexin 500 MG Oral Capsule;TAKE 1 CAPSULE 4 TIMES DAILY; Qty40; R0; Rx. Acetaminophen-Codeine #3 300-30 MG Oral Tablet;TAKE 1 TO 2 TABLETS EVERY 4 TO 6 HOURS AS   Erik Obey, Remy Voiles 20/01/5426, 6:04 PM

## 2014-09-08 NOTE — Anesthesia Postprocedure Evaluation (Signed)
  Anesthesia Post-op Note  Patient: Matthew Love  Procedure(s) Performed: Procedure(s): NASAL SEPTOPLASTY WITH TURBINATE REDUCTION (N/A)  Patient Location: PACU  Anesthesia Type:General  Level of Consciousness: awake, alert , oriented and patient cooperative  Airway and Oxygen Therapy: Patient Spontanous Breathing  Post-op Pain: none  Post-op Assessment: Post-op Vital signs reviewed, Patient's Cardiovascular Status Stable, Respiratory Function Stable, Patent Airway, No signs of Nausea or vomiting and Pain level controlled  Post-op Vital Signs: Reviewed and stable  Last Vitals:  Filed Vitals:   09/08/14 1345  BP: 159/80  Pulse: 63  Temp:   Resp: 12    Complications: No apparent anesthesia complications

## 2014-09-09 ENCOUNTER — Encounter (HOSPITAL_COMMUNITY): Payer: Self-pay | Admitting: Otolaryngology

## 2014-09-09 DIAGNOSIS — J342 Deviated nasal septum: Secondary | ICD-10-CM | POA: Diagnosis not present

## 2014-09-09 NOTE — Progress Notes (Signed)
Discharge instructions given to patient and reviewed. Patient is ready for discharge. No questions verbalized by patient or patient's wife.

## 2014-09-09 NOTE — Discharge Summary (Signed)
  09/09/2014 8:38 AM  Matthew Love [id number]  Post-Op Day 1, Discharge Summary    Temp:  [97.3 F (36.3 C)-98.1 F (36.7 C)] 97.3 F (36.3 C) (10/09 0512) Pulse Rate:  [61-76] 66 (10/09 0512) Resp:  [9-20] 16 (10/09 0512) BP: (147-176)/(69-86) 164/69 mmHg (10/09 0512) SpO2:  [92 %-99 %] 98 % (10/09 0512) Weight:  [134.888 kg (297 lb 6 oz)] 134.888 kg (297 lb 6 oz) (10/08 0850),     Intake/Output Summary (Last 24 hours) at 09/09/14 0838 Last data filed at 09/08/14 2200  Gross per 24 hour  Intake   2040 ml  Output      5 ml  Net   2035 ml    Results for orders placed during the hospital encounter of 09/08/14 (from the past 24 hour(s))  GLUCOSE, CAPILLARY     Status: Abnormal   Collection Time    09/08/14  8:47 AM      Result Value Ref Range   Glucose-Capillary 101 (*) 70 - 99 mg/dL    SUBJECTIVE:  Min pain.  Breathing OK.  No chest pain.  Spont void  OBJECTIVE:  Color, energy good. Packs and trumpets removed without difficulty  IMPRESSION:  Satisfactory check  PLAN:  Discharge home  Admit:  8 OCT 15 Discharge:  9 OCT Final Diagnosis:  Deviated nasal septum, Hypertrophic inferior turbinates, OSA Proc:  Nasal septoplasty, SMR inferior turbinates Comp:  None Cond:  Ambulatory, taking po's, breathing well.  Min pain controlled. Recheck: 10 days Instructions written and given  Discharge summary:  Underwent surgery.  Observed with O2 sat monitoring, cardiac monitoring overnight.  Min pain.  Tol po's.  POD 1 packs removed and discharged to home and care of family.    [name]

## 2015-03-08 ENCOUNTER — Encounter: Payer: Self-pay | Admitting: Internal Medicine

## 2015-04-12 ENCOUNTER — Encounter: Payer: Self-pay | Admitting: Internal Medicine

## 2015-05-25 ENCOUNTER — Ambulatory Visit (AMBULATORY_SURGERY_CENTER): Payer: Self-pay

## 2015-05-25 VITALS — Ht 75.0 in | Wt 280.0 lb

## 2015-05-25 DIAGNOSIS — Z86010 Personal history of colonic polyps: Secondary | ICD-10-CM

## 2015-05-25 MED ORDER — NA SULFATE-K SULFATE-MG SULF 17.5-3.13-1.6 GM/177ML PO SOLN: 1.0000 | Freq: Once | ORAL | Status: DC

## 2015-05-25 NOTE — Progress Notes (Signed)
No diet drugs Not on home 02 No egg or soy allergies No history of anesthesia complications

## 2015-06-08 ENCOUNTER — Encounter: Payer: Self-pay | Admitting: Internal Medicine

## 2015-06-08 ENCOUNTER — Ambulatory Visit (AMBULATORY_SURGERY_CENTER): Payer: PRIVATE HEALTH INSURANCE | Admitting: Internal Medicine

## 2015-06-08 VITALS — BP 133/75 | HR 45 | Temp 97.1°F | Resp 11 | Ht 75.0 in | Wt 280.0 lb

## 2015-06-08 DIAGNOSIS — D122 Benign neoplasm of ascending colon: Secondary | ICD-10-CM | POA: Diagnosis not present

## 2015-06-08 DIAGNOSIS — Z86010 Personal history of colonic polyps: Secondary | ICD-10-CM | POA: Diagnosis not present

## 2015-06-08 MED ORDER — SODIUM CHLORIDE 0.9 % IV SOLN: 500.0000 mL | INTRAVENOUS | Status: DC

## 2015-06-08 NOTE — Op Note (Signed)
[facility]  [address] [place], [id number]   COLONOSCOPY PROCEDURE REPORT  PATIENT: Matthew Love, Matthew Love  MR#: [id number] BIRTHDATE: 1951-07-19 , 37  yrs. old GENDER: male ENDOSCOPIST: Eustace Quail, MD REFERRED [id number] Program Recall PROCEDURE DATE:  06/08/2015 PROCEDURE:   Colonoscopy, surveillance and Colonoscopy with snare polypectomy x 1 First Screening Colonoscopy - Avg.  risk and is 50 yrs.  old or older - No.  Prior Negative Screening - Now for repeat screening. N/A  History of Adenoma - Now for follow-up colonoscopy & has been > or = to 3 yrs.  Yes hx of adenoma.  Has been 3 or more years since last colonoscopy.  Polyps removed today? Yes ASA CLASS:   Class II INDICATIONS:Surveillance due to prior colonic neoplasia and PH Colon Adenoma. Index 2004 (-); F/U 2011 (TA) MEDICATIONS: Monitored anesthesia care and Propofol 300 mg IV  DESCRIPTION OF PROCEDURE:   After the risks benefits and alternatives of the procedure were thoroughly explained, informed consent was obtained.  The digital rectal exam revealed no abnormalities of the rectum.   The LB [id number]  endoscope was introduced through the anus and advanced to the cecum, which was identified by both the appendix and ileocecal valve. No adverse events experienced.   The quality of the prep was excellent. (Suprep was used)  The instrument was then slowly withdrawn as the colon was fully examined. Estimated blood loss is zero unless otherwise noted in this procedure report.    COLON FINDINGS: A single polyp measuring 5 mm in size was found in the ascending colon.  A polypectomy was performed with a cold snare.  The resection was complete, the polyp tissue was completely retrieved and sent to histology.   There was moderate diverticulosis noted in the sigmoid colon.   The examination was otherwise normal.  Retroflexed views revealed internal hemorrhoids. The time to cecum = 2.3  Withdrawal time = 12.5   The scope was withdrawn and the procedure completed.  COMPLICATIONS: There were no immediate complications.  ENDOSCOPIC IMPRESSION: 1.   Single polyp was found in the ascending colon; polypectomy was performed with a cold snare 2.   Moderate diverticulosis was noted in the sigmoid colon 3.   The examination was otherwise normal  RECOMMENDATIONS: 1. Follow up colonoscopy in 5 years  eSigned:  Eustace Quail, MD 06/08/2015 8:37 AM   cc: Burnard Bunting, MD and The Patient

## 2015-06-08 NOTE — Progress Notes (Signed)
Called to room to assist during endoscopic procedure.  Patient ID and intended procedure confirmed with present staff. Received instructions for my participation in the procedure from the performing physician.  

## 2015-06-08 NOTE — Patient Instructions (Signed)
Discharge instructions given. Handout on polyps and diverticulosis. Resume previous medications. YOU HAD AN ENDOSCOPIC PROCEDURE TODAY AT THE [facility]:   Refer to the procedure report that was given to you for any specific questions about what was found during the examination.  If the procedure report does not answer your questions, please call your gastroenterologist to clarify.  If you requested that your care partner not be given the details of your procedure findings, then the procedure report has been included in a sealed envelope for you to review at your convenience later.  YOU SHOULD EXPECT: Some feelings of bloating in the abdomen. Passage of more gas than usual.  Walking can help get rid of the air that was put into your GI tract during the procedure and reduce the bloating. If you had a lower endoscopy (such as a colonoscopy or flexible sigmoidoscopy) you may notice spotting of blood in your stool or on the toilet paper. If you underwent a bowel prep for your procedure, you may not have a normal bowel movement for a few days.  Please Note:  You might notice some irritation and congestion in your nose or some drainage.  This is from the oxygen used during your procedure.  There is no need for concern and it should clear up in a day or so.  SYMPTOMS TO REPORT IMMEDIATELY:   Following lower endoscopy (colonoscopy or flexible sigmoidoscopy):  Excessive amounts of blood in the stool  Significant tenderness or worsening of abdominal pains  Swelling of the abdomen that is new, acute  Fever of 100F or higher   For urgent or emergent issues, a gastroenterologist can be reached at any hour by calling [phone].   DIET: Your first meal following the procedure should be a small meal and then it is ok to progress to your normal diet. Heavy or fried foods are harder to digest and may make you feel nauseous or bloated.  Likewise, meals heavy in dairy and vegetables can  increase bloating.  Drink plenty of fluids but you should avoid alcoholic beverages for 24 hours.  ACTIVITY:  You should plan to take it easy for the rest of today and you should NOT DRIVE or use heavy machinery until tomorrow (because of the sedation medicines used during the test).    FOLLOW UP: Our staff will call the number listed on your records the next business day following your procedure to check on you and address any questions or concerns that you may have regarding the information given to you following your procedure. If we do not reach you, we will leave a message.  However, if you are feeling well and you are not experiencing any problems, there is no need to return our call.  We will assume that you have returned to your regular daily activities without incident.  If any biopsies were taken you will be contacted by phone or by letter within the next 1-3 weeks.  Please call us at [phone] if you have not heard about the biopsies in 3 weeks.    SIGNATURES/CONFIDENTIALITY: You and/or your care partner have signed paperwork which will be entered into your electronic medical record.  These signatures attest to the fact that that the information above on your After Visit Summary has been reviewed and is understood.  Full responsibility of the confidentiality of this discharge information lies with you and/or your care-partner.  

## 2015-06-08 NOTE — Progress Notes (Signed)
Transferred to recovery room. A/O x3, pleased with MAC.  VSS.  Report to Celia, RN. 

## 2015-06-09 ENCOUNTER — Telehealth: Payer: Self-pay | Admitting: *Deleted

## 2015-06-09 NOTE — Telephone Encounter (Signed)
  Follow up Call-  Call back number 06/08/2015  Post procedure Call Back phone  # [phone]  Permission to leave phone message Yes     Patient questions:  Voice mail box has not been set up yet.

## 2015-06-13 ENCOUNTER — Encounter: Payer: Self-pay | Admitting: Internal Medicine

## 2015-07-25 ENCOUNTER — Encounter: Payer: Self-pay | Admitting: Internal Medicine

## 2015-08-21 ENCOUNTER — Ambulatory Visit (HOSPITAL_BASED_OUTPATIENT_CLINIC_OR_DEPARTMENT_OTHER)
Admission: RE | Admit: 2015-08-21 | Discharge: 2015-08-21 | Disposition: A | Discharge status: Home / Self Care | Payer: 59 | Source: Ambulatory Visit | Attending: Cardiovascular Disease | Admitting: Cardiovascular Disease

## 2015-08-21 ENCOUNTER — Ambulatory Visit (INDEPENDENT_AMBULATORY_CARE_PROVIDER_SITE_OTHER): Payer: PRIVATE HEALTH INSURANCE | Admitting: Cardiovascular Disease

## 2015-08-21 ENCOUNTER — Encounter: Payer: Self-pay | Admitting: Cardiovascular Disease

## 2015-08-21 ENCOUNTER — Ambulatory Visit (HOSPITAL_COMMUNITY)
Admission: RE | Admit: 2015-08-21 | Discharge: 2015-08-21 | Disposition: A | Discharge status: Home / Self Care | Payer: 59 | Source: Ambulatory Visit | Attending: Cardiovascular Disease | Admitting: Cardiovascular Disease

## 2015-08-21 VITALS — BP 124/82 | HR 57 | Ht 75.0 in | Wt 277.8 lb

## 2015-08-21 DIAGNOSIS — I10 Essential (primary) hypertension: Secondary | ICD-10-CM

## 2015-08-21 DIAGNOSIS — R0989 Other specified symptoms and signs involving the circulatory and respiratory systems: Secondary | ICD-10-CM

## 2015-08-21 DIAGNOSIS — R200 Anesthesia of skin: Secondary | ICD-10-CM | POA: Insufficient documentation

## 2015-08-21 DIAGNOSIS — E119 Type 2 diabetes mellitus without complications: Secondary | ICD-10-CM | POA: Diagnosis not present

## 2015-08-21 DIAGNOSIS — R202 Paresthesia of skin: Secondary | ICD-10-CM | POA: Insufficient documentation

## 2015-08-21 DIAGNOSIS — E785 Hyperlipidemia, unspecified: Secondary | ICD-10-CM

## 2015-08-21 NOTE — Progress Notes (Signed)
[place], [place]  [address] Phone: [phone] Fax:  [phone]  Date:  08/21/2015   Name:  Matthew Love   DOB:  October 03, 1951   MRN:  [id number]  PCP:  Geoffery Lyons, MD  Primary Cardiologist:  Dr. Liam Rogers  Primary Electrophysiologist:  None    History of Present Illness: Matthew Love is a 64 y.o. male who returns for evaluation of chest pain.  He has a history of HTN, DM2, HL, GERD and sleep apnea. He is noncompliant with CPAP.   Echo 11/11: EF 25-36%, grade 1 diastolic dysfunction, mild LAE.  ETT-Myoview 11/11: Inferior fixed defect likely due to diaphragmatic attenuation with normal wall motion, EF 69%.   Over the last 2-3 months, he has noted worsening left-sided chest pain. He can point to it with one finger. It feels dull. He denies any exertional chest pain. He notes it only at rest. He denies any radiating symptoms or associated nausea, diaphoresis. He denies associated shortness of breath. He does have dyspnea with more extreme activities. He denies orthopnea, PND or significant pedal edema. He has had a cough over last several months. This is productive of clear sputum. He was prescribed a Z-Pak at one point with his primary care provider. He denies any recent travels, injuries to his lower extremities or recent hospitalizations.  He denies pleuritic chest pain or chest pain with lying supine.  Sept. 17, 2013 -   He was seen by [name] in August for chest pain. A stress Myoview study was normal. He increase his Nexium and the mild chest discomfort improved.  Sept. 19, 2016:  Doing well  . Retired from [employer]   Exercising  Some .   Walking 30 minutes every AM.   His foot and toes on left side get very cold   Wt Readings from Last 3 Encounters:  08/21/15 126.009 kg (277 lb 12.8 oz)  06/08/15 127.007 kg (280 lb)  05/25/15 127.007 kg (280 lb)     Past Medical History  Diagnosis Date  . Hypertension    . GERD (gastroesophageal reflux disease)   . Obesity   . Hyperlipidemia   . H/O echocardiogram     Echo 11/11: EF 64-40%, grade 1 diastolic dysfunction, mild LAE  . H/O cardiovascular stress test     ETT-Myoview 11/11: Inferior fixed defect likely due to diaphragmatic attenuation with normal wall motion, EF 69%  . Mini stroke   . Pneumonia     hx  . Anxiety   . H/O hiatal hernia   . Arthritis   . Diabetes mellitus     A1C 6 weeks ago as of 09/08/14 was 6.2. Not on oral meds.  . Sleep apnea     cpap 1 yr  . Stroke 10    Current Outpatient Prescriptions  Medication Sig Dispense Refill  . aspirin 81 MG tablet Take 81 mg by mouth daily.      . Cyanocobalamin 2500 MCG TABS Take 1 tablet by mouth daily.    [name] EPINEPHrine (EPIPEN) 0.3 mg/0.3 mL SOAJ injection Inject 0.3 mLs (0.3 mg total) into the muscle once. For signs or symptoms of anaphylaxis. 1 Device 0  . escitalopram (LEXAPRO) 10 MG tablet Take 1 tablet by mouth daily.    [name] esomeprazole (NEXIUM) 20 MG capsule Take 20 mg by mouth every morning.    [name] esomeprazole (NEXIUM) 40 MG capsule Take 40 mg by mouth at bedtime.     [name]  Multiple Vitamin (MULTIVITAMIN) tablet Take 1 tablet by mouth daily.      . naproxen (NAPROSYN) 500 MG tablet Take 500 mg by mouth 2 (two) times daily as needed for moderate pain.     [name] OMEGA 3 1200 MG CAPS Take 1,200 mg by mouth daily.     [name] triamterene-hydrochlorothiazide (MAXZIDE-25) 37.5-25 MG per tablet Take 1 each (1 tablet total) by mouth daily. 30 tablet 0  . valsartan (DIOVAN) 160 MG tablet Take 160 mg by mouth daily.     No current facility-administered medications for this visit.    Allergies: Allergies  Allergen Reactions  . Bee Venom   . Hydrocodone-Acetaminophen     REACTION: rash    Social History  Substance Use Topics  . Smoking status: Former Smoker -- 3.50 packs/day for 20 years    Types: Cigarettes    Quit date: 12/02/1984  . Smokeless tobacco: Never Used  . Alcohol Use: No      Comment: quit 85     ROS:  Please see the history of present illness.     All other systems reviewed and negative.   PHYSICAL EXAM: VS:  BP 124/82 mmHg  Pulse 57  Ht 6\' 3"  (1.905 m)  Wt 126.009 kg (277 lb 12.8 oz)  BMI 34.72 kg/m2 Repeat blood pressure by me 120/80  Well nourished, well developed, in no acute distress HEENT: normal Neck: no JVD Vascular: No carotid bruits Endocrine: No thyromegaly Cardiac:  normal S1, S2; RRR; no murmur Lungs:  clear to auscultation bilaterally, no wheezing, rhonchi or rales Abd: soft, nontender, no hepatomegaly Ext: no edema Skin: warm and dry Neuro:  CNs 2-12 intact, no focal abnormalities noted  EKG:  Sept. 19, 2016:   Sinus brady at 57.   ASSESSMENT AND PLAN:  1.  Hypertension:  BP is well controlled. Continue current meds.   2. DM type 2 - followed by Dr. Reynaldo Minium  3.  Hyperlipidemia: - managed by his primary MD  4. Sleep apnea.  Will see him again in 1 year.     Nahser, Wonda Cheng, MD  08/21/2015 11:36 AM    [place],  [place], [place]  [address] Pager [phone] Phone: [phone]; Fax: [phone]   [facility]  [address] [place], [place]  [address] [phone]   Fax [phone]

## 2015-08-21 NOTE — Patient Instructions (Signed)
Medication Instructions:  Your physician recommends that you continue on your current medications as directed. Please refer to the Current Medication list given to you today.   Labwork: None Ordered   Testing/Procedures: Your physician has requested that you have an ankle brachial index (ABI). During this test an ultrasound and blood pressure cuff are used to evaluate the arteries that supply the arms and legs with blood. Allow thirty minutes for this exam. There are no restrictions or special instructions.    Follow-Up: Your physician wants you to follow-up in: 1 year with Dr. Acie Fredrickson.  You will receive a reminder letter in the mail two months in advance. If you don't receive a letter, please call our office to schedule the follow-up appointment.

## 2015-08-24 ENCOUNTER — Encounter (HOSPITAL_COMMUNITY): Payer: PRIVATE HEALTH INSURANCE

## 2015-09-05 ENCOUNTER — Telehealth: Payer: Self-pay | Admitting: Internal Medicine

## 2015-09-05 NOTE — Telephone Encounter (Signed)
Patient wife called back in stating to disregard this message and that patient will call back if symptoms worsen.

## 2015-09-05 NOTE — Telephone Encounter (Signed)
Noted  

## 2016-08-22 ENCOUNTER — Telehealth: Payer: Self-pay | Admitting: Cardiovascular Disease

## 2016-08-22 ENCOUNTER — Ambulatory Visit (INDEPENDENT_AMBULATORY_CARE_PROVIDER_SITE_OTHER): Payer: Commercial Managed Care - PPO | Admitting: Cardiovascular Disease

## 2016-08-22 ENCOUNTER — Encounter: Payer: Self-pay | Admitting: Cardiovascular Disease

## 2016-08-22 VITALS — BP 130/78 | HR 64 | Ht 75.0 in | Wt 287.4 lb

## 2016-08-22 DIAGNOSIS — I10 Essential (primary) hypertension: Secondary | ICD-10-CM | POA: Diagnosis not present

## 2016-08-22 DIAGNOSIS — R079 Chest pain, unspecified: Secondary | ICD-10-CM | POA: Diagnosis not present

## 2016-08-22 DIAGNOSIS — E785 Hyperlipidemia, unspecified: Secondary | ICD-10-CM

## 2016-08-22 NOTE — Telephone Encounter (Signed)
New message       Pt c/o of Chest Pain: STAT if CP now or developed within 24 hours  1. Are you having CP right now?  no  2. Are you experiencing any other symptoms (ex. SOB, nausea, vomiting, sweating)?  Sob with exertion sometimes 3. How long have you been experiencing CP?  Off and on for 1 month; however at 12:15am and 4:55am, pt had chest pain episodes 4. Is your CP continuous or coming and going?  Comes and goes  5. Have you taken Nitroglycerin?  No, but pt chews 2 aspirins during episodes Wife is at work, please call pt at home at [phone] ?

## 2016-08-22 NOTE — Patient Instructions (Signed)
Medication Instructions:  Your physician recommends that you continue on your current medications as directed. Please refer to the Current Medication list given to you today.   Labwork: None Ordered   Testing/Procedures: Your physician has requested that you have an exercise stress myoview. For further information please visit [email or web address]. Please follow instruction sheet, as given.    Follow-Up: Your physician wants you to follow-up in: 1 year with Dr. Acie Fredrickson.  You will receive a reminder letter in the mail two months in advance. If you don't receive a letter, please call our office to schedule the follow-up appointment.   If you need a refill on your cardiac medications before your next appointment, please call your pharmacy.   Thank you for choosing [facility]! Christen Bame, RN [phone]

## 2016-08-22 NOTE — Progress Notes (Signed)
[place], [place]  [address] Phone: [phone] Fax:  [phone]  Date:  08/22/2016   Name:  Matthew Love   DOB:  07-07-1951   MRN:  [id number]  PCP:  Matthew Lyons, MD  Primary Cardiologist:  Matthew Love  Primary Electrophysiologist:  None    History of Present Illness: Matthew Love is a 65 y.o. male who returns for evaluation of chest pain.  He has a history of HTN, DM2, HL, GERD and sleep apnea. He is noncompliant with CPAP.   Echo 11/11: EF 123456, grade 1 diastolic dysfunction, mild LAE.  ETT-Myoview 11/11: Inferior fixed defect likely due to diaphragmatic attenuation with normal wall motion, EF 69%.   Over the last 2-3 months, he has noted worsening left-sided chest pain. He can point to it with one finger. It feels dull. He denies any exertional chest pain. He notes it only at rest. He denies any radiating symptoms or associated nausea, diaphoresis. He denies associated shortness of breath. He does have dyspnea with more extreme activities. He denies orthopnea, PND or significant pedal edema. He has had a cough over last several months. This is productive of clear sputum. He was prescribed a Z-Pak at one point with his primary care provider. He denies any recent travels, injuries to his lower extremities or recent hospitalizations.  He denies pleuritic chest pain or chest pain with lying supine.  Sept. 17, 2013 -   He was seen by Matthew Love in August for chest pain. A stress Myoview study was normal. He increase his Nexium and the mild chest discomfort improved.  Sept. 19, 2016:  Doing well  . Retired from [employer]   Exercising  Some .   Walking 30 minutes every AM.   His foot and toes on left side get very cold   Sept. 21, 2017:  Matthew Love is seen today for a work in visit Had some chest pain  Center of chest.    Seemed to be worse when he put his hand on his chest  Awoke last night with some left sided chest  pain  Usually occurs when he is lying down. Walking 20 minutes a day -  Has gained about 10 lbs over the past month   Wt Readings from Last 3 Encounters:  08/22/16 287 lb 6.4 oz (130.4 kg)  08/21/15 277 lb 12.8 oz (126 kg)  06/08/15 280 lb (127 kg)     Past Medical History:  Diagnosis Date  . Anxiety   . Arthritis   . Diabetes mellitus    A1C 6 weeks ago as of 09/08/14 was 6.2. Not on oral meds.  [name] GERD (gastroesophageal reflux disease)   . H/O cardiovascular stress test    ETT-Myoview 11/11: Inferior fixed defect likely due to diaphragmatic attenuation with normal wall motion, EF 69%  . H/O echocardiogram    Echo 11/11: EF 123456, grade 1 diastolic dysfunction, mild LAE  . H/O hiatal hernia   . Hyperlipidemia   . Hypertension   . Mini stroke ([place])   . Obesity   . Pneumonia    hx  . Sleep apnea    cpap 1 yr  . Stroke [facility]) 10    Current Outpatient Prescriptions  Medication Sig Dispense Refill  . aspirin 81 MG tablet Take 81 mg by mouth daily.      [name] EPINEPHrine (EPIPEN) 0.3 mg/0.3 mL SOAJ injection Inject 0.3 mLs (0.3 mg total) into the muscle once.  For signs or symptoms of anaphylaxis. 1 Device 0  . escitalopram (LEXAPRO) 10 MG tablet Take 1 tablet by mouth daily.    [name] esomeprazole (NEXIUM) 20 MG capsule Take 20 mg by mouth every morning.    . Multiple Vitamin (MULTIVITAMIN) tablet Take 1 tablet by mouth daily.      . naproxen (NAPROSYN) 500 MG tablet Take 500 mg by mouth 2 (two) times daily as needed for moderate pain.     [name] OMEGA 3 1200 MG CAPS Take 1,200 mg by mouth daily.     [name] triamterene-hydrochlorothiazide (MAXZIDE-25) 37.5-25 MG per tablet Take 1 each (1 tablet total) by mouth daily. 30 tablet 0  . valsartan (DIOVAN) 160 MG tablet Take 160 mg by mouth daily.     No current facility-administered medications for this visit.     Allergies: Allergies  Allergen Reactions  . Bee Venom   . Hydrocodone-Acetaminophen     REACTION: rash    Social History   Substance Use Topics  . Smoking status: Former Smoker    Packs/day: 3.50    Years: 20.00    Types: Cigarettes    Quit date: 12/02/1984  . Smokeless tobacco: Never Used  . Alcohol use No     Comment: quit 86     ROS:  Please see the history of present illness.     All other systems reviewed and negative.   PHYSICAL EXAM: VS:  BP 130/78 (BP Location: Left Arm, Patient Position: Sitting, Cuff Size: Large)   Pulse 64   Ht 6\' 3"  (1.905 m)   Wt 287 lb 6.4 oz (130.4 kg)   BMI 35.92 kg/m  Repeat blood pressure by me 120/80  Well nourished, well developed, in no acute distress  HEENT: normal  Neck: no JVD Vascular: No carotid bruits Endocrine: No thyromegaly  Cardiac:  normal S1, S2; RRR; no murmur  Lungs:  clear to auscultation bilaterally, no wheezing, rhonchi or rales  Abd: soft, nontender, no hepatomegaly  Ext: no edema  Skin: warm and dry  Neuro:  CNs 2-12 intact, no focal abnormalities noted  EKG:    Sept. 21, 2017:   NSR at 64.   NS ST abn.   ASSESSMENT AND PLAN:  1.    Chest pain :   Has Had some episodes of chest discomfort. These episodes are somewhat atypical but he does have risk factors for cardiac disease. He has some nonspecific changes on his EKG today. We will schedule him for a stress Myoview study for further evaluation. I'll see him again in one year. I'll see him sooner if the Myoview study is abnormal.  Hypertension:  BP is well controlled. Continue current meds.   2. DM type 2 - followed by Dr. Reynaldo Minium  3.  Hyperlipidemia: - managed by his primary MD  4. Sleep apnea.  Will see him again in 1 year.     Mertie Moores, MD  08/22/2016 3:40 PM    [facility] [place],  [place], [place]  [address] Pager [phone] Phone: [phone]; Fax: [phone]   [facility]  [address] [place], [place]  [address] [phone]   Fax [phone]

## 2016-08-22 NOTE — Telephone Encounter (Signed)
Pt has an appointment with Dr. Cathie Olden today at 3:45 Pm. Pt is aware of appointment.  I called back left a message to arrive to his appointment 15 minutes prior to his appointment.

## 2016-08-22 NOTE — Telephone Encounter (Signed)
F/u   Returning nurse call.

## 2016-08-22 NOTE — Telephone Encounter (Signed)
Spoke with pt's wife at her work. Wife asked for RN to call pt a home. Called pt left a message to call back. 10;00 Called pt again 16 minutes later LEFT A MESSAGE TO CALL BACK. Pt's wife is aware.

## 2016-08-22 NOTE — Telephone Encounter (Signed)
Spoke with pt. He  has been having chest pain on and off for the last week. Last night about 12:15 he woke up took his C-pap off then he felt a mild dull pain in the center of the chest  Which got worse when lying down, pt denies any other symptoms. Pt took 2 aspirins when the pains was gone just as a precaution. Pt also had mild pain again on his left breast very mild, but he knew it was there. Again it lasted a few seconds. No other symptoms. Pt states that the CP is different then what he had before. Pt feels fine now. Pt has an appointment with Dr. Cathie Olden on 10/03/16 at 8:15 AM. Pt is aware that I will make MD aware for recommendations.

## 2016-08-22 NOTE — Telephone Encounter (Signed)
Left pt a message to call back. 

## 2016-09-02 ENCOUNTER — Ambulatory Visit (HOSPITAL_COMMUNITY): Payer: Commercial Managed Care - PPO | Attending: Cardiology

## 2016-09-02 DIAGNOSIS — R079 Chest pain, unspecified: Secondary | ICD-10-CM | POA: Diagnosis not present

## 2016-09-02 DIAGNOSIS — R9439 Abnormal result of other cardiovascular function study: Secondary | ICD-10-CM | POA: Diagnosis not present

## 2016-09-02 MED ORDER — REGADENOSON 0.4 MG/5ML IV SOLN
0.4000 mg | Freq: Once | INTRAVENOUS | Status: AC
  Administered 2016-09-02: 0.4 mg via INTRAVENOUS

## 2016-09-02 MED ORDER — TECHNETIUM TC 99M TETROFOSMIN IV KIT
32.7000 | PACK | Freq: Once | INTRAVENOUS | Status: AC | PRN
  Administered 2016-09-02: 32.7 via INTRAVENOUS
  Filled 2016-09-02: qty 33

## 2016-09-03 ENCOUNTER — Ambulatory Visit (HOSPITAL_COMMUNITY): Payer: Commercial Managed Care - PPO | Attending: Cardiovascular Disease

## 2016-09-03 LAB — MYOCARDIAL PERFUSION IMAGING
CHL CUP NUCLEAR SSS: 11
CSEPPHR: 115 {beats}/min
LHR: 0.35
LVDIAVOL: 91 mL (ref 62–150)
LVSYSVOL: 39 mL
Rest HR: 65 {beats}/min
SDS: 10
SRS: 1
TID: 0.84

## 2016-09-03 MED ORDER — TECHNETIUM TC 99M TETROFOSMIN IV KIT
32.9000 | PACK | Freq: Once | INTRAVENOUS | Status: AC | PRN
  Administered 2016-09-03: 32.9 via INTRAVENOUS
  Filled 2016-09-03: qty 33

## 2016-09-05 ENCOUNTER — Encounter: Payer: Self-pay | Admitting: Nurse Practitioner

## 2016-09-05 ENCOUNTER — Telehealth: Payer: Self-pay | Admitting: Cardiovascular Disease

## 2016-09-05 ENCOUNTER — Other Ambulatory Visit: Payer: Commercial Managed Care - PPO | Admitting: *Deleted

## 2016-09-05 DIAGNOSIS — R079 Chest pain, unspecified: Secondary | ICD-10-CM

## 2016-09-05 DIAGNOSIS — R9430 Abnormal result of cardiovascular function study, unspecified: Secondary | ICD-10-CM

## 2016-09-05 LAB — CBC WITH DIFFERENTIAL/PLATELET
BASOS PCT: 1 %
Basophils Absolute: 71 cells/uL (ref 0–200)
EOS ABS: 284 {cells}/uL (ref 15–500)
Eosinophils Relative: 4 %
HEMATOCRIT: 46.7 % (ref 38.5–50.0)
HEMOGLOBIN: 15.8 g/dL (ref 13.2–17.1)
LYMPHS ABS: 2059 {cells}/uL (ref 850–3900)
LYMPHS PCT: 29 %
MCH: 28.3 pg (ref 27.0–33.0)
MCHC: 33.8 g/dL (ref 32.0–36.0)
MCV: 83.5 fL (ref 80.0–100.0)
MONO ABS: 852 {cells}/uL (ref 200–950)
MPV: 9.4 fL (ref 7.5–12.5)
Monocytes Relative: 12 %
NEUTROS ABS: 3834 {cells}/uL (ref 1500–7800)
Neutrophils Relative %: 54 %
Platelets: 269 10*3/uL (ref 140–400)
RBC: 5.59 MIL/uL (ref 4.20–5.80)
RDW: 13.4 % (ref 11.0–15.0)
WBC: 7.1 10*3/uL (ref 3.8–10.8)

## 2016-09-05 LAB — BASIC METABOLIC PANEL
BUN: 17 mg/dL (ref 7–25)
CHLORIDE: 103 mmol/L (ref 98–110)
CO2: 30 mmol/L (ref 20–31)
Calcium: 9.6 mg/dL (ref 8.6–10.3)
Creat: 1.32 mg/dL — ABNORMAL HIGH (ref 0.70–1.25)
Glucose, Bld: 97 mg/dL (ref 65–99)
POTASSIUM: 4.5 mmol/L (ref 3.5–5.3)
SODIUM: 141 mmol/L (ref 135–146)

## 2016-09-05 LAB — PROTIME-INR
INR: 1.1
Prothrombin Time: 11.2 s (ref 9.0–11.5)

## 2016-09-05 NOTE — Telephone Encounter (Signed)
New message      Returning a call to Dr Acie Fredrickson to get stress test results

## 2016-09-05 NOTE — Telephone Encounter (Signed)
Reviewed abnormal myoview results with patient's wife, [name], who verbalized understanding. She states patient's cell phone is broken and she will review with her husband.  She states that she and patient are in agreement to proceed with cardiac cath if that is Dr. Elmarie Shiley recommendation.  Patient is scheduled for left heart cath at [facility] on Friday, Oct. 6 with Dr. Angelena Form.  Wife is aware that patient needs to come here for lab work today. I reviewed pre-procedure instructions with Dianne and placed a copy in the lab for patient to pick up when he comes to the lab today.  I advised Dianne to call back with questions or concerns.  She verbalized understanding and agreement with plan and thanked me for the call.

## 2016-09-06 ENCOUNTER — Encounter (HOSPITAL_COMMUNITY)
Admission: RE | Disposition: A | Discharge status: Home / Self Care | Payer: Self-pay | Source: Ambulatory Visit | Attending: Cardiovascular Disease

## 2016-09-06 ENCOUNTER — Encounter (HOSPITAL_COMMUNITY): Payer: Self-pay | Admitting: *Deleted

## 2016-09-06 ENCOUNTER — Ambulatory Visit (HOSPITAL_COMMUNITY)
Admission: RE | Admit: 2016-09-06 | Discharge: 2016-09-07 | Disposition: A | Discharge status: Home / Self Care | Payer: Commercial Managed Care - PPO | Source: Ambulatory Visit | Attending: Cardiovascular Disease | Admitting: Cardiovascular Disease

## 2016-09-06 ENCOUNTER — Other Ambulatory Visit: Payer: Self-pay

## 2016-09-06 DIAGNOSIS — E669 Obesity, unspecified: Secondary | ICD-10-CM | POA: Diagnosis not present

## 2016-09-06 DIAGNOSIS — R9439 Abnormal result of other cardiovascular function study: Secondary | ICD-10-CM

## 2016-09-06 DIAGNOSIS — G4733 Obstructive sleep apnea (adult) (pediatric): Secondary | ICD-10-CM | POA: Diagnosis not present

## 2016-09-06 DIAGNOSIS — F419 Anxiety disorder, unspecified: Secondary | ICD-10-CM | POA: Diagnosis not present

## 2016-09-06 DIAGNOSIS — Z8673 Personal history of transient ischemic attack (TIA), and cerebral infarction without residual deficits: Secondary | ICD-10-CM | POA: Diagnosis not present

## 2016-09-06 DIAGNOSIS — K219 Gastro-esophageal reflux disease without esophagitis: Secondary | ICD-10-CM | POA: Diagnosis not present

## 2016-09-06 DIAGNOSIS — I200 Unstable angina: Secondary | ICD-10-CM

## 2016-09-06 DIAGNOSIS — E785 Hyperlipidemia, unspecified: Secondary | ICD-10-CM | POA: Diagnosis not present

## 2016-09-06 DIAGNOSIS — Z955 Presence of coronary angioplasty implant and graft: Secondary | ICD-10-CM

## 2016-09-06 DIAGNOSIS — E119 Type 2 diabetes mellitus without complications: Secondary | ICD-10-CM | POA: Insufficient documentation

## 2016-09-06 DIAGNOSIS — I25110 Atherosclerotic heart disease of native coronary artery with unstable angina pectoris: Secondary | ICD-10-CM | POA: Insufficient documentation

## 2016-09-06 DIAGNOSIS — Z6835 Body mass index (BMI) 35.0-35.9, adult: Secondary | ICD-10-CM | POA: Diagnosis not present

## 2016-09-06 DIAGNOSIS — Z9119 Patient's noncompliance with other medical treatment and regimen: Secondary | ICD-10-CM | POA: Insufficient documentation

## 2016-09-06 DIAGNOSIS — M1990 Unspecified osteoarthritis, unspecified site: Secondary | ICD-10-CM | POA: Diagnosis not present

## 2016-09-06 DIAGNOSIS — Z87891 Personal history of nicotine dependence: Secondary | ICD-10-CM | POA: Insufficient documentation

## 2016-09-06 DIAGNOSIS — I10 Essential (primary) hypertension: Secondary | ICD-10-CM | POA: Diagnosis present

## 2016-09-06 HISTORY — PX: CORONARY STENT PLACEMENT: SHX1402

## 2016-09-06 HISTORY — DX: Atherosclerotic heart disease of native coronary artery without angina pectoris: I25.10

## 2016-09-06 HISTORY — PX: CARDIAC CATHETERIZATION: SHX172

## 2016-09-06 LAB — GLUCOSE, CAPILLARY
GLUCOSE-CAPILLARY: 99 mg/dL (ref 65–99)
GLUCOSE-CAPILLARY: 148 mg/dL — AB (ref 65–99)
Glucose-Capillary: 115 mg/dL — ABNORMAL HIGH (ref 65–99)
Glucose-Capillary: 137 mg/dL — ABNORMAL HIGH (ref 65–99)

## 2016-09-06 SURGERY — LEFT HEART CATH AND CORONARY ANGIOGRAPHY

## 2016-09-06 MED ORDER — FENTANYL CITRATE (PF) 100 MCG/2ML IJ SOLN
INTRAMUSCULAR | Status: AC
  Filled 2016-09-06: qty 2

## 2016-09-06 MED ORDER — HEPARIN SODIUM (PORCINE) 1000 UNIT/ML IJ SOLN
INTRAMUSCULAR | Status: AC
  Filled 2016-09-06: qty 1

## 2016-09-06 MED ORDER — SODIUM CHLORIDE 0.9% FLUSH: 3.0000 mL | INTRAVENOUS | Status: DC | PRN

## 2016-09-06 MED ORDER — ACTIVE PARTNERSHIP FOR HEALTH OF YOUR HEART BOOK
Freq: Once | Status: AC
  Administered 2016-09-06: 21:00:00
  Filled 2016-09-06: qty 1

## 2016-09-06 MED ORDER — HEPARIN SODIUM (PORCINE) 1000 UNIT/ML IJ SOLN
INTRAMUSCULAR | Status: DC | PRN
  Administered 2016-09-06: 3000 [IU] via INTRAVENOUS
  Administered 2016-09-06 (×2): 6000 [IU] via INTRAVENOUS

## 2016-09-06 MED ORDER — PANTOPRAZOLE SODIUM 40 MG PO TBEC
40.0000 mg | DELAYED_RELEASE_TABLET | Freq: Every day | ORAL | Status: DC
  Administered 2016-09-07: 10:00:00 40 mg via ORAL
  Filled 2016-09-06: qty 1

## 2016-09-06 MED ORDER — NITROGLYCERIN 1 MG/10 ML FOR IR/CATH LAB
INTRA_ARTERIAL | Status: AC
  Filled 2016-09-06: qty 10

## 2016-09-06 MED ORDER — TICAGRELOR 90 MG PO TABS
ORAL_TABLET | ORAL | Status: DC | PRN
  Administered 2016-09-06: 180 mg via ORAL

## 2016-09-06 MED ORDER — SODIUM CHLORIDE 0.9 % IV SOLN: 250.0000 mL | INTRAVENOUS | Status: DC | PRN

## 2016-09-06 MED ORDER — ANGIOPLASTY BOOK
Freq: Once | Status: AC
  Administered 2016-09-06: 21:00:00
  Filled 2016-09-06: qty 1

## 2016-09-06 MED ORDER — IOPAMIDOL (ISOVUE-370) INJECTION 76%
INTRAVENOUS | Status: AC
  Filled 2016-09-06: qty 50

## 2016-09-06 MED ORDER — HEPARIN (PORCINE) IN NACL 2-0.9 UNIT/ML-% IJ SOLN
INTRAMUSCULAR | Status: AC
  Filled 2016-09-06: qty 1000

## 2016-09-06 MED ORDER — ADULT MULTIVITAMIN W/MINERALS CH
1.0000 | ORAL_TABLET | Freq: Every day | ORAL | Status: DC
  Administered 2016-09-07: 1 via ORAL
  Filled 2016-09-06 (×2): qty 1

## 2016-09-06 MED ORDER — SODIUM CHLORIDE 0.9 % IV SOLN
INTRAVENOUS | Status: AC
  Administered 2016-09-06: 18:00:00 via INTRAVENOUS

## 2016-09-06 MED ORDER — ONDANSETRON HCL 4 MG/2ML IJ SOLN: 4.0000 mg | Freq: Four times a day (QID) | INTRAMUSCULAR | Status: DC | PRN

## 2016-09-06 MED ORDER — SODIUM CHLORIDE 0.9 % WEIGHT BASED INFUSION: 1.0000 mL/kg/h | INTRAVENOUS | Status: DC

## 2016-09-06 MED ORDER — SODIUM CHLORIDE 0.9% FLUSH
3.0000 mL | Freq: Two times a day (BID) | INTRAVENOUS | Status: DC
  Administered 2016-09-06: 18:00:00 3 mL via INTRAVENOUS

## 2016-09-06 MED ORDER — OMEGA-3-ACID ETHYL ESTERS 1 G PO CAPS
1.0000 g | ORAL_CAPSULE | Freq: Every day | ORAL | Status: DC
  Administered 2016-09-07: 1 g via ORAL
  Filled 2016-09-06: qty 1

## 2016-09-06 MED ORDER — IOPAMIDOL (ISOVUE-370) INJECTION 76%
INTRAVENOUS | Status: AC
  Filled 2016-09-06: qty 100

## 2016-09-06 MED ORDER — OMEGA 3 1200 MG PO CAPS: 1200.0000 mg | ORAL_CAPSULE | Freq: Every day | ORAL | Status: DC

## 2016-09-06 MED ORDER — VERAPAMIL HCL 2.5 MG/ML IV SOLN
INTRAVENOUS | Status: DC | PRN
  Administered 2016-09-06: 10 mL via INTRA_ARTERIAL

## 2016-09-06 MED ORDER — LIDOCAINE HCL (PF) 1 % IJ SOLN
INTRAMUSCULAR | Status: AC
  Filled 2016-09-06: qty 30

## 2016-09-06 MED ORDER — LIDOCAINE HCL (PF) 1 % IJ SOLN
INTRAMUSCULAR | Status: DC | PRN
  Administered 2016-09-06: 3 mL

## 2016-09-06 MED ORDER — SODIUM CHLORIDE 0.9% FLUSH: 3.0000 mL | Freq: Two times a day (BID) | INTRAVENOUS | Status: DC

## 2016-09-06 MED ORDER — TICAGRELOR 90 MG PO TABS
ORAL_TABLET | ORAL | Status: AC
  Filled 2016-09-06: qty 2

## 2016-09-06 MED ORDER — IRBESARTAN 75 MG PO TABS
150.0000 mg | ORAL_TABLET | Freq: Every day | ORAL | Status: DC
  Administered 2016-09-07: 150 mg via ORAL
  Filled 2016-09-06: qty 2

## 2016-09-06 MED ORDER — IOPAMIDOL (ISOVUE-370) INJECTION 76%
INTRAVENOUS | Status: DC | PRN
  Administered 2016-09-06: 180 mL via INTRA_ARTERIAL

## 2016-09-06 MED ORDER — TRIAMTERENE-HCTZ 37.5-25 MG PO TABS
1.0000 | ORAL_TABLET | Freq: Every day | ORAL | Status: DC
  Administered 2016-09-07: 10:00:00 1 via ORAL
  Filled 2016-09-06: qty 1

## 2016-09-06 MED ORDER — MIDAZOLAM HCL 2 MG/2ML IJ SOLN
INTRAMUSCULAR | Status: DC | PRN
  Administered 2016-09-06 (×2): 1 mg via INTRAVENOUS
  Administered 2016-09-06: 2 mg via INTRAVENOUS

## 2016-09-06 MED ORDER — ESCITALOPRAM OXALATE 10 MG PO TABS
10.0000 mg | ORAL_TABLET | Freq: Every day | ORAL | Status: DC
  Administered 2016-09-07: 10:00:00 10 mg via ORAL
  Filled 2016-09-06: qty 1

## 2016-09-06 MED ORDER — FENTANYL CITRATE (PF) 100 MCG/2ML IJ SOLN
INTRAMUSCULAR | Status: DC | PRN
  Administered 2016-09-06: 25 ug via INTRAVENOUS
  Administered 2016-09-06: 50 ug via INTRAVENOUS
  Administered 2016-09-06: 25 ug via INTRAVENOUS

## 2016-09-06 MED ORDER — MIDAZOLAM HCL 2 MG/2ML IJ SOLN
INTRAMUSCULAR | Status: AC
  Filled 2016-09-06: qty 2

## 2016-09-06 MED ORDER — ATORVASTATIN CALCIUM 40 MG PO TABS
40.0000 mg | ORAL_TABLET | Freq: Every day | ORAL | Status: DC
  Administered 2016-09-06: 40 mg via ORAL
  Filled 2016-09-06: qty 1

## 2016-09-06 MED ORDER — NITROGLYCERIN 1 MG/10 ML FOR IR/CATH LAB
INTRA_ARTERIAL | Status: DC | PRN
  Administered 2016-09-06 (×2): 200 ug via INTRACORONARY

## 2016-09-06 MED ORDER — SODIUM CHLORIDE 0.9 % WEIGHT BASED INFUSION
3.0000 mL/kg/h | INTRAVENOUS | Status: DC
  Administered 2016-09-06: 3 mL/kg/h via INTRAVENOUS

## 2016-09-06 MED ORDER — HEPARIN (PORCINE) IN NACL 2-0.9 UNIT/ML-% IJ SOLN
INTRAMUSCULAR | Status: DC | PRN
  Administered 2016-09-06: 1000 mL

## 2016-09-06 MED ORDER — ASPIRIN EC 81 MG PO TBEC
81.0000 mg | DELAYED_RELEASE_TABLET | Freq: Every day | ORAL | Status: DC
  Administered 2016-09-07: 81 mg via ORAL
  Filled 2016-09-06: qty 1

## 2016-09-06 MED ORDER — VERAPAMIL HCL 2.5 MG/ML IV SOLN
INTRAVENOUS | Status: AC
  Filled 2016-09-06: qty 2

## 2016-09-06 MED ORDER — TICAGRELOR 90 MG PO TABS
90.0000 mg | ORAL_TABLET | Freq: Two times a day (BID) | ORAL | Status: DC
  Administered 2016-09-07 (×2): 90 mg via ORAL
  Filled 2016-09-06 (×2): qty 1

## 2016-09-06 SURGICAL SUPPLY — 19 items
BALLN EMERGE MR 2.0X12 (BALLOONS) ×3
BALLN ~~LOC~~ EMERGE MR 3.0X12 (BALLOONS) ×3
BALLOON EMERGE MR 2.0X12 (BALLOONS) IMPLANT
BALLOON ~~LOC~~ EMERGE MR 3.0X12 (BALLOONS) IMPLANT
CATH INFINITI 5 FR JL3.5 (CATHETERS) ×2 IMPLANT
CATH INFINITI JR4 5F (CATHETERS) ×2 IMPLANT
CATH VISTA GUIDE 6FR XBLAD3.5 (CATHETERS) ×2 IMPLANT
DEVICE RAD COMP TR BAND LRG (VASCULAR PRODUCTS) ×2 IMPLANT
GLIDESHEATH SLEND SS 6F .021 (SHEATH) ×2 IMPLANT
KIT ENCORE 26 ADVANTAGE (KITS) ×2 IMPLANT
KIT HEART LEFT (KITS) ×3 IMPLANT
PACK CARDIAC CATHETERIZATION (CUSTOM PROCEDURE TRAY) ×3 IMPLANT
STENT PROMUS PREM MR 2.25X20 (Permanent Stent) ×2 IMPLANT
STENT PROMUS PREM MR 3.0X16 (Permanent Stent) ×2 IMPLANT
SYR MEDRAD MARK V 150ML (SYRINGE) ×3 IMPLANT
TRANSDUCER W/STOPCOCK (MISCELLANEOUS) ×3 IMPLANT
TUBING CIL FLEX 10 FLL-RA (TUBING) ×3 IMPLANT
WIRE COUGAR XT STRL 190CM (WIRE) ×2 IMPLANT
WIRE SAFE-T 1.5MM-J .035X260CM (WIRE) ×2 IMPLANT

## 2016-09-06 NOTE — Progress Notes (Signed)
TR BAND REMOVAL  LOCATION:    right radial  DEFLATED PER PROTOCOL:    Yes.    TIME BAND OFF / DRESSING APPLIED:    1730   SITE UPON ARRIVAL:    Level 0  SITE AFTER BAND REMOVAL:    Level 0  CIRCULATION SENSATION AND MOVEMENT:    Within Normal Limits   Yes.    COMMENTS:   Tolerated procedure well . Post TRB instructions given

## 2016-09-06 NOTE — Research (Signed)
CAD LAD Informed Consent   Subject Name: TARRY FOUNTAIN  Subject met inclusion and exclusion criteria.  The informed consent form, study requirements and expectations were reviewed with the subject and questions and concerns were addressed prior to the signing of the consent form.  The subject verbalized understanding of the trail requirements.  The subject agreed to participate in the CAD LAD trial and signed the informed consent.  The informed consent was obtained prior to performance of any protocol-specific procedures for the subject.  A copy of the signed informed consent was given to the subject and a copy was placed in the subject's medical record.  Hedrick,Laveah Gloster W 09/06/2016, 1140

## 2016-09-06 NOTE — Care Management Note (Signed)
Case Management Note  Patient Details  Name: Matthew Love MRN: [id number] Date of Birth: 20-Jan-1951  Subjective/Objective:    pta indep, s/p intervention will be on brilinta, co pay is $101.06, patient states this is too high.  NCM informed him to let MD know at follow up apt and he will switch him to something else.  [facility] in [place] say they have 49 tablets, they will have to order the rest.  NCM informed patient of this information.                Action/Plan:   Expected Discharge Date:                  Expected Discharge Plan:  Home/Self Care  In-House Referral:     Discharge planning Services  CM Consult  Post Acute Care Choice:    Choice offered to:     DME Arranged:    DME Agency:     HH Arranged:    HH Agency:     Status of Service:  Completed, signed off  If discussed at [employer] of Stay Meetings, dates discussed:    Additional Comments:  Zenon Mayo, RN 09/06/2016, 5:50 PM

## 2016-09-06 NOTE — H&P (View-Only) (Signed)
[place], [place]  [address] Phone: [phone] Fax:  [phone]  Date:  08/22/2016   Name:  Matthew Love   DOB:  17-Sep-1951   MRN:  [id number]  PCP:  Matthew Lyons, MD  Primary Cardiologist:  Dr. Liam Love  Primary Electrophysiologist:  None    History of Present Illness: Matthew Love is a 65 y.o. male who returns for evaluation of chest pain.  He has a history of HTN, DM2, HL, GERD and sleep apnea. He is noncompliant with CPAP.   Echo 11/11: EF 123456, grade 1 diastolic dysfunction, mild LAE.  ETT-Myoview 11/11: Inferior fixed defect likely due to diaphragmatic attenuation with normal wall motion, EF 69%.   Over the last 2-3 months, he has noted worsening left-sided chest pain. He can point to it with one finger. It feels dull. He denies any exertional chest pain. He notes it only at rest. He denies any radiating symptoms or associated nausea, diaphoresis. He denies associated shortness of breath. He does have dyspnea with more extreme activities. He denies orthopnea, PND or significant pedal edema. He has had a cough over last several months. This is productive of clear sputum. He was prescribed a Z-Pak at one point with his primary care provider. He denies any recent travels, injuries to his lower extremities or recent hospitalizations.  He denies pleuritic chest pain or chest pain with lying supine.  Sept. 17, 2013 -   He was seen by Matthew Love in August for chest pain. A stress Myoview study was normal. He increase his Nexium and the mild chest discomfort improved.  Sept. 19, 2016:  Doing well  . Retired from [employer]   Exercising  Some .   Walking 30 minutes every AM.   His foot and toes on left side get very cold   Sept. 21, 2017:  Matthew Love is seen today for a work in visit Had some chest pain  Center of chest.    Seemed to be worse when he put his hand on his chest  Awoke last night with some left sided chest  pain  Usually occurs when he is lying down. Walking 20 minutes a day -  Has gained about 10 lbs over the past month   Wt Readings from Last 3 Encounters:  08/22/16 287 lb 6.4 oz (130.4 kg)  08/21/15 277 lb 12.8 oz (126 kg)  06/08/15 280 lb (127 kg)     Past Medical History:  Diagnosis Date  . Anxiety   . Arthritis   . Diabetes mellitus    A1C 6 weeks ago as of 09/08/14 was 6.2. Not on oral meds.  [name] GERD (gastroesophageal reflux disease)   . H/O cardiovascular stress test    ETT-Myoview 11/11: Inferior fixed defect likely due to diaphragmatic attenuation with normal wall motion, EF 69%  . H/O echocardiogram    Echo 11/11: EF 123456, grade 1 diastolic dysfunction, mild LAE  . H/O hiatal hernia   . Hyperlipidemia   . Hypertension   . Mini stroke ([place])   . Obesity   . Pneumonia    hx  . Sleep apnea    cpap 1 yr  . Stroke [facility]) 10    Current Outpatient Prescriptions  Medication Sig Dispense Refill  . aspirin 81 MG tablet Take 81 mg by mouth daily.      [name] EPINEPHrine (EPIPEN) 0.3 mg/0.3 mL SOAJ injection Inject 0.3 mLs (0.3 mg total) into the muscle once.  For signs or symptoms of anaphylaxis. 1 Device 0  . escitalopram (LEXAPRO) 10 MG tablet Take 1 tablet by mouth daily.    [name] esomeprazole (NEXIUM) 20 MG capsule Take 20 mg by mouth every morning.    . Multiple Vitamin (MULTIVITAMIN) tablet Take 1 tablet by mouth daily.      . naproxen (NAPROSYN) 500 MG tablet Take 500 mg by mouth 2 (two) times daily as needed for moderate pain.     [name] OMEGA 3 1200 MG CAPS Take 1,200 mg by mouth daily.     [name] triamterene-hydrochlorothiazide (MAXZIDE-25) 37.5-25 MG per tablet Take 1 each (1 tablet total) by mouth daily. 30 tablet 0  . valsartan (DIOVAN) 160 MG tablet Take 160 mg by mouth daily.     No current facility-administered medications for this visit.     Allergies: Allergies  Allergen Reactions  . Bee Venom   . Hydrocodone-Acetaminophen     REACTION: rash    Social History   Substance Use Topics  . Smoking status: Former Smoker    Packs/day: 3.50    Years: 20.00    Types: Cigarettes    Quit date: 12/02/1984  . Smokeless tobacco: Never Used  . Alcohol use No     Comment: quit 86     ROS:  Please see the history of present illness.     All other systems reviewed and negative.   PHYSICAL EXAM: VS:  BP 130/78 (BP Location: Left Arm, Patient Position: Sitting, Cuff Size: Large)   Pulse 64   Ht 6\' 3"  (1.905 m)   Wt 287 lb 6.4 oz (130.4 kg)   BMI 35.92 kg/m  Repeat blood pressure by me 120/80  Well nourished, well developed, in no acute distress  HEENT: normal  Neck: no JVD Vascular: No carotid bruits Endocrine: No thyromegaly  Cardiac:  normal S1, S2; RRR; no murmur  Lungs:  clear to auscultation bilaterally, no wheezing, rhonchi or rales  Abd: soft, nontender, no hepatomegaly  Ext: no edema  Skin: warm and dry  Neuro:  CNs 2-12 intact, no focal abnormalities noted  EKG:    Sept. 21, 2017:   NSR at 64.   NS ST abn.   ASSESSMENT AND PLAN:  1.    Chest pain :   Has Had some episodes of chest discomfort. These episodes are somewhat atypical but he does have risk factors for cardiac disease. He has some nonspecific changes on his EKG today. We will schedule him for a stress Myoview study for further evaluation. I'll see him again in one year. I'll see him sooner if the Myoview study is abnormal.  Hypertension:  BP is well controlled. Continue current meds.   2. DM type 2 - followed by Matthew Love  3.  Hyperlipidemia: - managed by his primary MD  4. Sleep apnea.  Will see him again in 1 year.     Matthew Moores, MD  08/22/2016 3:40 PM    [facility] [place],  [place], [place]  [address] Pager [phone] Phone: [phone]; Fax: [phone]   [facility]  [address] [place], [place]  [address] [phone]   Fax [phone]

## 2016-09-06 NOTE — Interval H&P Note (Signed)
History and Physical Interval Note:  09/06/2016 12:38 PM  Matthew Love  has presented today for cardiac catheterization with the diagnosis of unstable angina/ abnormal myview  The various methods of treatment have been discussed with the patient and family. After consideration of risks, benefits and other options for treatment, the patient has consented to  Procedure(s): Left Heart Cath and Coronary Angiography (N/A) as a surgical intervention .  The patient's history has been reviewed, patient examined, no change in status, stable for surgery.  I have reviewed the patient's chart and labs.  Questions were answered to the patient's satisfaction.   Cath Lab Visit (complete for each Cath Lab visit)  Clinical Evaluation Leading to the Procedure:   ACS: No.  Non-ACS:    Anginal Classification: CCS III  Anti-ischemic medical therapy: Minimal Therapy (1 class of medications)  Non-Invasive Test Results: High-risk stress test findings: cardiac mortality >3%/year  Prior CABG: No previous CABG          Lauree Chandler

## 2016-09-07 ENCOUNTER — Other Ambulatory Visit: Payer: Self-pay | Admitting: Cardiology

## 2016-09-07 ENCOUNTER — Encounter (HOSPITAL_COMMUNITY): Payer: Self-pay | Admitting: Student

## 2016-09-07 ENCOUNTER — Other Ambulatory Visit: Payer: Self-pay

## 2016-09-07 ENCOUNTER — Telehealth: Payer: Self-pay | Admitting: Cardiology

## 2016-09-07 DIAGNOSIS — E119 Type 2 diabetes mellitus without complications: Secondary | ICD-10-CM | POA: Diagnosis not present

## 2016-09-07 DIAGNOSIS — I10 Essential (primary) hypertension: Secondary | ICD-10-CM | POA: Diagnosis not present

## 2016-09-07 DIAGNOSIS — E785 Hyperlipidemia, unspecified: Secondary | ICD-10-CM | POA: Diagnosis not present

## 2016-09-07 DIAGNOSIS — I25110 Atherosclerotic heart disease of native coronary artery with unstable angina pectoris: Secondary | ICD-10-CM | POA: Diagnosis not present

## 2016-09-07 DIAGNOSIS — I200 Unstable angina: Secondary | ICD-10-CM

## 2016-09-07 DIAGNOSIS — R9439 Abnormal result of other cardiovascular function study: Secondary | ICD-10-CM

## 2016-09-07 LAB — BASIC METABOLIC PANEL
Anion gap: 10 (ref 5–15)
BUN: 17 mg/dL (ref 6–20)
CHLORIDE: 103 mmol/L (ref 101–111)
CO2: 30 mmol/L (ref 22–32)
CREATININE: 1.23 mg/dL (ref 0.61–1.24)
Calcium: 9.4 mg/dL (ref 8.9–10.3)
GFR calc Af Amer: >60 mL/min (ref >60)
GFR calc non Af Amer: 60 mL/min — ABNORMAL LOW (ref >60)
Glucose, Bld: 140 mg/dL — ABNORMAL HIGH (ref 65–99)
Potassium: 3.8 mmol/L (ref 3.5–5.1)
Sodium: 143 mmol/L (ref 135–145)

## 2016-09-07 LAB — CBC
HCT: 47.7 % (ref 39.0–52.0)
Hemoglobin: 15.4 g/dL (ref 13.0–17.0)
MCH: 27.9 pg (ref 26.0–34.0)
MCHC: 32.3 g/dL (ref 30.0–36.0)
MCV: 86.4 fL (ref 78.0–100.0)
PLATELETS: 220 10*3/uL (ref 150–400)
RBC: 5.52 MIL/uL (ref 4.22–5.81)
RDW: 13.1 % (ref 11.5–15.5)
WBC: 7.7 10*3/uL (ref 4.0–10.5)

## 2016-09-07 LAB — LIPID PANEL
CHOLESTEROL: 159 mg/dL (ref 0–200)
HDL: 30 mg/dL — AB (ref >40)
LDL CALC: 77 mg/dL (ref 0–99)
TRIGLYCERIDES: 258 mg/dL — AB (ref <150)
Total CHOL/HDL Ratio: 5.3 RATIO
VLDL: 52 mg/dL — ABNORMAL HIGH (ref 0–40)

## 2016-09-07 LAB — GLUCOSE, CAPILLARY: Glucose-Capillary: 140 mg/dL — ABNORMAL HIGH (ref 65–99)

## 2016-09-07 MED ORDER — ATORVASTATIN CALCIUM 40 MG PO TABS: 40.0000 mg | ORAL_TABLET | Freq: Every day | ORAL | 6 refills | Status: DC

## 2016-09-07 MED ORDER — TICAGRELOR 90 MG PO TABS: 90.0000 mg | ORAL_TABLET | Freq: Two times a day (BID) | ORAL | 0 refills | Status: DC

## 2016-09-07 MED ORDER — NITROGLYCERIN 0.4 MG SL SUBL: 0.4000 mg | SUBLINGUAL_TABLET | SUBLINGUAL | Status: DC | PRN

## 2016-09-07 MED ORDER — TICAGRELOR 90 MG PO TABS: 90.0000 mg | ORAL_TABLET | Freq: Two times a day (BID) | ORAL | 10 refills | Status: DC

## 2016-09-07 NOTE — Discharge Summary (Signed)
Discharge Summary    Patient ID: Matthew Love,  MRN: [id number], DOB/AGE: [birth year] 65 y.o.  Admit date: 09/06/2016 Discharge date: 09/07/2016  Primary Care Provider: ARONSON,RICHARD A Primary Cardiologist: Dr. Acie Fredrickson  Discharge Diagnoses    Principal Problem:   Unstable angina [facility]) Active Problems:   Hypertension   Hyperlipidemia   Abnormal stress test   History of Present Illness     Matthew Love is a 65 y.o. male with past medical history of HTN, HLD, Type 2 DM, GERD, and OSA (noncompliant with CPAP) who presented to [facility] on 09/06/2016 for a cardiac catheterization.  Has been seen in the office by Dr. Acie Fredrickson and reported having centralized chest discomfort which had occurred several times over the past month. Most episodes occurred at rest but on the day of presentation to the office, he noted the pain had awoke him from sleep. A NST was recommended which showed a partially reversible defect in the inferior wall and a small defect in the inferolateral wall consistent with ischemia, overall being a high-risk study. Cardiac cath was recommended for definitive evaluation.  Hospital Course     Consultants: None  Cardiac cath was performed and showed severe singe vessel disease with 80% stenosis along the mid-LAD and 90% stenosis along the distal LAD. PCI was performed with placement of two DES. Mild nonobstructive disease along the RCA and LCx was noted as well. He will be started on DAPT with ASA and Brilinta along with statin therapy. No BB secondary to bradycardia.   The following morning, he denied any repeat episodes of chest pain. Breathing was at baseline. Creatinine remained stable at 1.23. Hgb at 15.4. Lipid Panel showed total cholesterol 159, HDL 30, and LDL 77, therefore being started on Atorvastatin 40mg  daily.   He was last examined by Dr. Harrington Challenger and deemed stable for discharge. A staff message has been sent to arrange for 2-week hospital follow-up.  He was discharged home in good condition.   _____________  Discharge Vitals Blood pressure (!) 139/97, pulse 64, temperature 98.1 F (36.7 C), temperature source Oral, resp. rate 13, height 6\' 3"  (1.905 m), weight 276 lb 14.4 oz (125.6 kg), SpO2 98 %.  Filed Weights   09/06/16 1010 09/07/16 0414  Weight: 275 lb (124.7 kg) 276 lb 14.4 oz (125.6 kg)    Labs & Radiologic Studies     CBC  Recent Labs  09/05/16 1028 09/07/16 0405  WBC 7.1 7.7  NEUTROABS 3,834  --   HGB 15.8 15.4  HCT 46.7 47.7  MCV 83.5 86.4  PLT 269 [id number]   Basic Metabolic Panel  Recent Labs  09/05/16 1028 09/07/16 0405  NA 141 143  K 4.5 3.8  CL 103 103  CO2 30 30  GLUCOSE 97 140*  BUN 17 17  CREATININE 1.32* 1.23  CALCIUM 9.6 9.4   Liver Function Tests No results for input(s): AST, ALT, ALKPHOS, BILITOT, PROT, ALBUMIN in the last 72 hours. No results for input(s): LIPASE, AMYLASE in the last 72 hours. Cardiac Enzymes No results for input(s): CKTOTAL, CKMB, CKMBINDEX, TROPONINI in the last 72 hours. BNP Invalid input(s): POCBNP D-Dimer No results for input(s): DDIMER in the last 72 hours. Hemoglobin A1C No results for input(s): HGBA1C in the last 72 hours. Fasting Lipid Panel  Recent Labs  09/07/16 0709  CHOL 159  HDL 30*  LDLCALC 77  TRIG 258*  CHOLHDL 5.3   Thyroid Function Tests No results for input(s): TSH, T4TOTAL,  T3FREE, THYROIDAB in the last 72 hours.  Invalid input(s): FREET3  No results found.   Diagnostic Studies/Procedures     Cardiac Catheterization: 09/06/2016  Ost RCA to Prox RCA lesion, 30 %stenosed.  Mid RCA lesion, 20 %stenosed.  Ost Cx to Prox Cx lesion, 20 %stenosed.  A STENT PROMUS PREM MR 3.0X16 drug eluting stent was successfully placed.  Prox LAD to Mid LAD lesion, 80 %stenosed.  Post intervention, there is a 0% residual stenosis.  A STENT PROMUS PREM MR 2.25X20 drug eluting stent was successfully placed.  Dist LAD lesion, 90  %stenosed.  Post intervention, there is a 0% residual stenosis.   1. Severe single vessel CAD 2. Severe stenosis mid LAD, s/p successful PTCA/DES x 1 3. Severe stenosis distal LAD s/p successful PTCA/DES x 1 4. Mild non-obstructive disease in the RCA and Circumflex  Recommendations: will plan DAPT with ASA and Brilinta for one year. Will start statin. Will not start beta blocker given his bradycardia.   Disposition   Pt is being discharged home today in good condition.  Follow-up Plans & Appointments    Follow-up Information    Mertie Moores, MD .   Specialty:  Cardiology Why:  The office will call you within 3 business days to schedule an appointment. If you do not hear from them, please call the number provided.  Contact information: [address] [place] [address] [phone]          Discharge Instructions    Discharge instructions    Complete by:  As directed    PLEASE REMEMBER TO BRING ALL OF YOUR MEDICATIONS TO EACH OF YOUR FOLLOW-UP OFFICE VISITS.  PLEASE ATTEND ALL SCHEDULED FOLLOW-UP APPOINTMENTS.   Activity: Increase activity slowly as tolerated. You may shower, but no soaking baths (or swimming) for 1 week. No driving for 24 hours. No lifting over 5 lbs for 1 week. No sexual activity for 1 week.   You May Return to Work: in 1 week (if applicable)  Wound Care: You may wash cath site gently with soap and water. Keep cath site clean and dry. If you notice pain, swelling, bleeding or pus at your cath site, please call [phone].   Increase activity slowly    Complete by:  As directed       Discharge Medications     Medication List    STOP taking these medications   naproxen 500 MG tablet Commonly known as:  NAPROSYN     TAKE these medications   aspirin 81 MG tablet Take 81 mg by mouth daily.   atorvastatin 40 MG tablet Commonly known as:  LIPITOR Take 1 tablet (40 mg total) by mouth daily at 6 PM.   EPINEPHrine 0.3  mg/0.3 mL Soaj injection Commonly known as:  EPIPEN Inject 0.3 mLs (0.3 mg total) into the muscle once. For signs or symptoms of anaphylaxis.   escitalopram 10 MG tablet Commonly known as:  LEXAPRO Take 10 mg by mouth daily.   esomeprazole 20 MG capsule Commonly known as:  NEXIUM Take 20 mg by mouth every morning.   multivitamin tablet Take 1 tablet by mouth daily.   Omega 3 1200 MG Caps Take 1,200 mg by mouth daily.   ticagrelor 90 MG Tabs tablet Commonly known as:  BRILINTA Take 1 tablet (90 mg total) by mouth 2 (two) times daily.   triamterene-hydrochlorothiazide 37.5-25 MG tablet Commonly known as:  MAXZIDE-25 Take 1 each (1 tablet total) by mouth daily.  valsartan 160 MG tablet Commonly known as:  DIOVAN Take 160 mg by mouth daily.       Aspirin prescribed at discharge?  Yes High Intensity Statin Prescribed? (Lipitor 40-80mg  or Crestor 20-40mg ): Yes Beta Blocker Prescribed? No: Bradycardia For EF 40% or less, Was ACEI/ARB Prescribed? No: N/A ADP Receptor Inhibitor Prescribed? (i.e. Plavix etc.-Includes Medically Managed Patients): Yes For EF <40%, Aldosterone Inhibitor Prescribed? No: N/A Was EF assessed during THIS hospitalization? Yes - Cath Was Cardiac Rehab II ordered? (Included Medically managed Patients): Yes   Allergies Allergies  Allergen Reactions  . Bee Venom Anaphylaxis, Itching and Swelling  . Hydrocodone-Acetaminophen Rash     Outstanding Labs/Studies   Lipid Panel and LFT's in 6 weeks.   Duration of Discharge Encounter   Greater than 30 minutes including physician time.  Signed, Erma Heritage, PA-C 09/07/2016, 8:20 AM

## 2016-09-07 NOTE — Telephone Encounter (Signed)
Pt and wife called he had mild chest discomfort under Lt breast lasting less than 1 min.  They were re-assured and instructions given on longer pain,  NTG script was sent to his pharmacy

## 2016-09-07 NOTE — Progress Notes (Signed)
CARDIAC REHAB PHASE I   PRE:  Rate/Rhythm: 70 SR  BP:   Sitting: 146/81     SaO2: 97% RA  MODE:  Ambulation: 450 ft   POST:  Rate/Rhythm: 78 SR c rare PVC's  BP:   Sitting: 117/64   Standing 143/79     SaO2: 99%   827-932  Pt ambulated 472ft independently. Pt maintained a steady pace and gait. Pt denied SOB, dizziness and CP while walking. Returned pt to bed with VSS. Did education in which the following was discussed: HH diet, diabetic friendly HO was given, stent cards, risk factors,importance of Brilinta/ASA therapy, exercise guidelines, activity restrictions,  and emergency use of NTG. Education was done with spouse in the room. Did teach back and both pt and spouse voiced understanding. Referred pt to [facility] cardiac rehab.    Baljit Liebert D Michaelanthony Kempton,MS,ACSM-RCEP 09/07/2016 9:28 AM

## 2016-09-07 NOTE — Progress Notes (Signed)
Subjective: No CP  Breathing is OK   Objective: Vitals:   09/06/16 1603 09/06/16 1939 09/06/16 2000 09/07/16 0414  BP: (!) 140/94 138/62  (!) 163/71  Pulse: (!) 59 60 (!) 57 65  Resp: (!) 22 15 15 14   Temp: 97.7 F (36.5 C) 98.2 F (36.8 C)  97.6 F (36.4 C)  TempSrc: Oral Oral  Oral  SpO2: 99% 97% 97% 99%  Weight:    276 lb 14.4 oz (125.6 kg)  Height:       Weight change:   Intake/Output Summary (Last 24 hours) at 09/07/16 0636 Last data filed at 09/07/16 0100  Gross per 24 hour  Intake           911.25 ml  Output             2205 ml  Net         -1293.75 ml    General: Alert, awake, oriented x3, in no acute distress Neck:  JVP is normal Heart: Regular rate and rhythm, without murmurs, rubs, gallops.  Lungs: Clear to auscultation.  No rales or wheezes. Exemities:  No edema.   Neuro: Grossly intact, nonfocal.  Tele  SR    Lab Results: Results for orders placed or performed during the hospital encounter of 09/06/16 (from the past 24 hour(s))  Glucose, capillary     Status: Abnormal   Collection Time: 09/06/16 10:41 AM  Result Value Ref Range   Glucose-Capillary 115 (H) 65 - 99 mg/dL   Comment 1 Notify RN   Glucose, capillary     Status: None   Collection Time: 09/06/16  2:34 PM  Result Value Ref Range   Glucose-Capillary 99 65 - 99 mg/dL  Glucose, capillary     Status: Abnormal   Collection Time: 09/06/16  5:23 PM  Result Value Ref Range   Glucose-Capillary 148 (H) 65 - 99 mg/dL  Glucose, capillary     Status: Abnormal   Collection Time: 09/06/16  9:44 PM  Result Value Ref Range   Glucose-Capillary 137 (H) 65 - 99 mg/dL  Basic metabolic panel     Status: Abnormal   Collection Time: 09/07/16  4:05 AM  Result Value Ref Range   Sodium 143 135 - 145 mmol/L   Potassium 3.8 3.5 - 5.1 mmol/L   Chloride 103 101 - 111 mmol/L   CO2 30 22 - 32 mmol/L   Glucose, Bld 140 (H) 65 - 99 mg/dL   BUN 17 6 - 20 mg/dL   Creatinine, Ser 1.23 0.61 - 1.24 mg/dL   Calcium  9.4 8.9 - 10.3 mg/dL   GFR calc non Af Amer 60 (L) >60 mL/min   GFR calc Af Amer >60 >60 mL/min   Anion gap 10 5 - 15  CBC     Status: None   Collection Time: 09/07/16  4:05 AM  Result Value Ref Range   WBC 7.7 4.0 - 10.5 K/uL   RBC 5.52 4.22 - 5.81 MIL/uL   Hemoglobin 15.4 13.0 - 17.0 g/dL   HCT 47.7 39.0 - 52.0 %   MCV 86.4 78.0 - 100.0 fL   MCH 27.9 26.0 - 34.0 pg   MCHC 32.3 30.0 - 36.0 g/dL   RDW 13.1 11.5 - 15.5 %   Platelets 220 150 - 400 K/uL  Glucose, capillary     Status: Abnormal   Collection Time: 09/07/16  6:12 AM  Result Value Ref Range   Glucose-Capillary 140 (H) 65 - 99 mg/dL  Studies/Results: No results found.  Medications:Reviewed    @PROBHOSP @  1  CAD Cath yesterday sahow severe single vessel CAD with 80% prox mid LAD and 90% distal LAD  The pt underwent PTCA/DES x 2 to this vessel  Plan for ASA and Brilinta for 1 year     2  HTN  BP is labile  F/U as outpt   3 HL Continue statin  Watch carbs  Walk  4  OSA  5  DM    OK to d/c home with outpt f/u   LOS: 0 days   Dorris Carnes 09/07/2016, 6:36 AM

## 2016-09-09 LAB — POCT ACTIVATED CLOTTING TIME
ACTIVATED CLOTTING TIME: 257 s
Activated Clotting Time: 433 seconds

## 2016-09-09 NOTE — Progress Notes (Signed)
Cardiology Office Note    Date:  09/10/2016   ID:  Matthew Love, DOB 1951/10/01, MRN [id number]  PCP:  Geoffery Lyons, MD  Cardiologist:  Dr. Acie Fredrickson Chief Complaint: Hospital follow up s/p stent placement  History of Present Illness:   Matthew Love is a 65 y.o. male CAD, HTN, HLD, OSA (non compliant with CPAP), DM and GERD who presented for hospital follow up.   Recent NST 09/02/16 for symptoms concerning for unstable angina showed a partially reversible defect in the inferior wall and a small defect in the inferolateral wall consistent with ischemia, overall being a high-risk study. Cardiac cath was performed and showed severe singe vessel disease with 80% stenosis along the mid-LAD and 90% stenosis along the distal LAD. PCI was performed with placement of two DES. Mild nonobstructive disease along the RCA and LCx was noted as well. He will be started on DAPT with ASA and Brilinta along with statin therapy. No BB secondary to bradycardia.  Lipitor 40mg  for LDL of 77.   Here today for follow up. Saturday after discharge he had very mild chest pain that resolved in 2-4 mins with rest. No reccurance since then. Walking 5-10 minutes multiple times in a day without angina or dyspnea. No orthopnea, PND, syncope, palpitation or LE Edema.   Past Medical History:  Diagnosis Date  . Anxiety   . Arthritis   . Coronary artery disease    a. NST 08/2016: high-risk w/ inferior and lateral wall ischemia  b. cath 09/2016: 80% mid-LAD and 90% distal LAD stenosis. Placement of 2 DES. Mild nonobstructive dz along RCA and LCx.  . Diabetes mellitus    A1C 6 weeks ago as of 09/08/14 was 6.2. Not on oral meds.  [name] GERD (gastroesophageal reflux disease)   . H/O cardiovascular stress test    ETT-Myoview 11/11: Inferior fixed defect likely due to diaphragmatic attenuation with normal wall motion, EF 69%  . H/O echocardiogram    Echo 11/11: EF 123456, grade 1 diastolic dysfunction, mild LAE  . H/O  hiatal hernia   . Hyperlipidemia   . Hypertension   . Mini stroke ([place])   . Obesity   . Pneumonia    hx  . Sleep apnea    cpap 1 yr  . Stroke [facility]) 10    Past Surgical History:  Procedure Laterality Date  . BUNIONECTOMY    . CARDIAC CATHETERIZATION N/A 09/06/2016   Procedure: Left Heart Cath and Coronary Angiography;  Surgeon: Burnell Blanks, MD;  Location: [place] [facility];  Service: Cardiovascular;  Laterality: N/A;  . CARDIAC CATHETERIZATION N/A 09/06/2016   Procedure: Coronary Stent Intervention;  Surgeon: Burnell Blanks, MD;  Location: [place] [facility];  Service: Cardiovascular;  Laterality: N/A;  . COLONOSCOPY  2011  . CORONARY STENT PLACEMENT  09/06/2016   Severe stenosis mid LAD, s/p successful PTCA/DES x 1  . FOOT SURGERY Left    bunion  . Hydrocell Removal    . KNEE SURGERY Left    arthroscopy  . NASAL SEPTOPLASTY W/ TURBINOPLASTY N/A 09/08/2014   Procedure: NASAL SEPTOPLASTY WITH TURBINATE REDUCTION;  Surgeon: [name], MD;  Location: [place];  Service: ENT;  Laterality: N/A;  . TONSILLECTOMY      Current Medications: Prior to Admission medications   Medication Sig Start Date End Date Taking? Authorizing Provider  aspirin 81 MG tablet Take 81 mg by mouth daily.      Historical Provider, MD  atorvastatin (LIPITOR) 40  MG tablet Take 1 tablet (40 mg total) by mouth daily at 6 PM. 09/07/16   Erma Heritage, PA  EPINEPHrine (EPIPEN) 0.3 mg/0.3 mL SOAJ injection Inject 0.3 mLs (0.3 mg total) into the muscle once. For signs or symptoms of anaphylaxis. 02/20/14   [name], MD  escitalopram (LEXAPRO) 10 MG tablet Take 10 mg by mouth daily.  02/11/13   Historical Provider, MD  esomeprazole (NEXIUM) 20 MG capsule Take 20 mg by mouth every morning.    Historical Provider, MD  Multiple Vitamin (MULTIVITAMIN) tablet Take 1 tablet by mouth daily.      Historical Provider, MD  OMEGA 3 1200 MG CAPS Take 1,200 mg by mouth daily.     Historical Provider,  MD  ticagrelor (BRILINTA) 90 MG TABS tablet Take 1 tablet (90 mg total) by mouth 2 (two) times daily. 09/07/16   Erma Heritage, PA  triamterene-hydrochlorothiazide (MAXZIDE-25) 37.5-25 MG per tablet Take 1 each (1 tablet total) by mouth daily. 09/25/11   Thayer Headings, MD  valsartan (DIOVAN) 160 MG tablet Take 160 mg by mouth daily.    Historical Provider, MD    Allergies:   Bee venom and Hydrocodone-acetaminophen   Social History   Social History  . Marital status: Married    Spouse name: N/A  . Number of children: N/A  . Years of education: N/A   Occupational History  . 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 Smoking status: Former Smoker    Packs/day: 3.50    Years: 20.00    Types: Cigarettes    Quit date: 12/02/1984  . Smokeless tobacco: Never Used  . Alcohol use No     Comment: quit 85  . Drug use: No  . Sexual activity: Not Asked   Other Topics Concern  . None   Social History Narrative  . None     Family History:  The patient's family history includes Alzheimer's disease in his maternal grandmother; Colon cancer in his father; Heart attack in his mother; Heart disease in his other; Prostate cancer in his father.  ROS:   Please see the history of present illness.    ROS All other systems reviewed and are negative.   PHYSICAL EXAM:   VS:  BP 120/72   Pulse 61   Ht 6\' 3"  (1.905 m)   Wt 280 lb 12.8 oz (127.4 kg)   BMI 35.10 kg/m    GEN: Well nourished, well developed, in no acute distress  HEENT: normal  Neck: no JVD, carotid bruits, or masses Cardiac: RRR; no murmurs, rubs, or gallops,no edema. R radial cath site without hematoma Respiratory:  clear to auscultation bilaterally, normal work of breathing GI: soft, nontender, nondistended, + BS MS: no deformity or atrophy  Skin: warm and dry, no rash Neuro:  Alert and Oriented x 3, Strength and sensation are intact Psych: euthymic mood, full affect  Wt Readings from Last  3 Encounters:  09/10/16 280 lb 12.8 oz (127.4 kg)  09/07/16 276 lb 14.4 oz (125.6 kg)  09/02/16 287 lb (130.2 kg)      Studies/Labs Reviewed:   EKG:  EKG is ordered today.  The ekg ordered today demonstrates NSR at rate of 61 bpm. Non specific TWI. Slightly more pronounced in lead V3 to V5.   Recent Labs: 09/07/2016: BUN 17; Creatinine, Ser 1.23; Hemoglobin 15.4; Platelets 220; Potassium 3.8; Sodium 143   Lipid Panel    Component Value Date/Time  CHOL 159 09/07/2016 0709   TRIG 258 (H) 09/07/2016 0709   HDL 30 (L) 09/07/2016 0709   CHOLHDL 5.3 09/07/2016 0709   VLDL 52 (H) 09/07/2016 0709   LDLCALC 77 09/07/2016 0709    Additional studies/ records that were reviewed today include:   Cardiac Catheterization: 09/06/2016  Ost RCA to Prox RCA lesion, 30 %stenosed.  Mid RCA lesion, 20 %stenosed.  Ost Cx to Prox Cx lesion, 20 %stenosed.  A STENT PROMUS PREM MR 3.0X16 drug eluting stent was successfully placed.  Prox LAD to Mid LAD lesion, 80 %stenosed.  Post intervention, there is a 0% residual stenosis.  A STENT PROMUS PREM MR 2.25X20 drug eluting stent was successfully placed.  Dist LAD lesion, 90 %stenosed.  Post intervention, there is a 0% residual stenosis.  1. Severe single vessel CAD 2. Severe stenosis mid LAD, s/p successful PTCA/DES x 1 3. Severe stenosis distal LAD s/p successful PTCA/DES x 1 4. Mild non-obstructive disease in the RCA and Circumflex  Recommendations: will plan DAPT with ASA and Brilinta for one year. Will start statin. Will not start beta blocker given his bradycardia.      ASSESSMENT & PLAN:    1. CAD s/p PTCA/DES to mild LAD & dis LAD - Mild non-obstructive disease in the RCA and Circumflex on cath. No angina or dyspnea. Continue ASA and Brilinta. He has > $100 co-pay for Brillinta. He will apply for assistant program. Not on BB due to bradycardia.   2. HLD - 09/07/2016: Cholesterol 159; HDL 30; LDL Cholesterol 77; Triglycerides  258; VLDL 52  - Continue lipitor 40mg  qd. Lipid Panel and LFT's in 6 weeks.   3. HTN - Stable and well controlled. Continue current regimen.   4. OSA - Non compliant with CPAP    Medication Adjustments/Labs and Tests Ordered: Current medicines are reviewed at length with the patient today.  Concerns regarding medicines are outlined above.  Medication changes, Labs and Tests ordered today are listed in the Patient Instructions below. Patient Instructions  Medication Instructions:  Your physician recommends that you continue on your current medications as directed. Please refer to the Current Medication list given to you today.  Labwork: Your physician recommends that you return for lab work in: 6 weeks- fasting lipid and liver panel.   Testing/Procedures: NONE  Follow-Up: Your physician wants you to follow-up in: 3 months with Dr. Acie Fredrickson.    If you need a refill on your cardiac medications before your next appointment, please call your pharmacy.      Jarrett Soho, [place]  09/10/2016 8:37 AM    [facility] [place], [place], [place]  [address] Phone: [phone]; Fax: [phone]

## 2016-09-10 ENCOUNTER — Encounter: Payer: Self-pay | Admitting: Physician Assistant

## 2016-09-10 ENCOUNTER — Ambulatory Visit (INDEPENDENT_AMBULATORY_CARE_PROVIDER_SITE_OTHER): Payer: Commercial Managed Care - PPO | Admitting: Physician Assistant

## 2016-09-10 VITALS — BP 120/72 | HR 61 | Ht 75.0 in | Wt 280.8 lb

## 2016-09-10 DIAGNOSIS — I2510 Atherosclerotic heart disease of native coronary artery without angina pectoris: Secondary | ICD-10-CM | POA: Insufficient documentation

## 2016-09-10 DIAGNOSIS — I10 Essential (primary) hypertension: Secondary | ICD-10-CM

## 2016-09-10 DIAGNOSIS — I25110 Atherosclerotic heart disease of native coronary artery with unstable angina pectoris: Secondary | ICD-10-CM

## 2016-09-10 DIAGNOSIS — E785 Hyperlipidemia, unspecified: Secondary | ICD-10-CM | POA: Diagnosis not present

## 2016-09-10 DIAGNOSIS — G4730 Sleep apnea, unspecified: Secondary | ICD-10-CM | POA: Diagnosis not present

## 2016-09-10 MED ORDER — NITROGLYCERIN 0.4 MG SL SUBL: 0.4000 mg | SUBLINGUAL_TABLET | SUBLINGUAL | 3 refills | Status: DC | PRN

## 2016-09-10 NOTE — Patient Instructions (Addendum)
Medication Instructions:  Your physician recommends that you continue on your current medications as directed. Please refer to the Current Medication list given to you today.  Labwork: Your physician recommends that you return for lab work in: 6 weeks- fasting lipid and liver panel.   Testing/Procedures: NONE  Follow-Up: Your physician wants you to follow-up in: 3 months with Dr. Acie Fredrickson.    If you need a refill on your cardiac medications before your next appointment, please call your pharmacy.

## 2016-09-23 IMAGING — CR DG CHEST 2V
2 series · 2 of 2 positions shown · non-contrast
Comparison: Chest x-ray of 07/28/2012

CLINICAL DATA: Preop for surgery for deviated nasal septum, some
shortness of breath with exertion, former smoking history

EXAM:
CHEST  2 VIEW

[w chest pa]
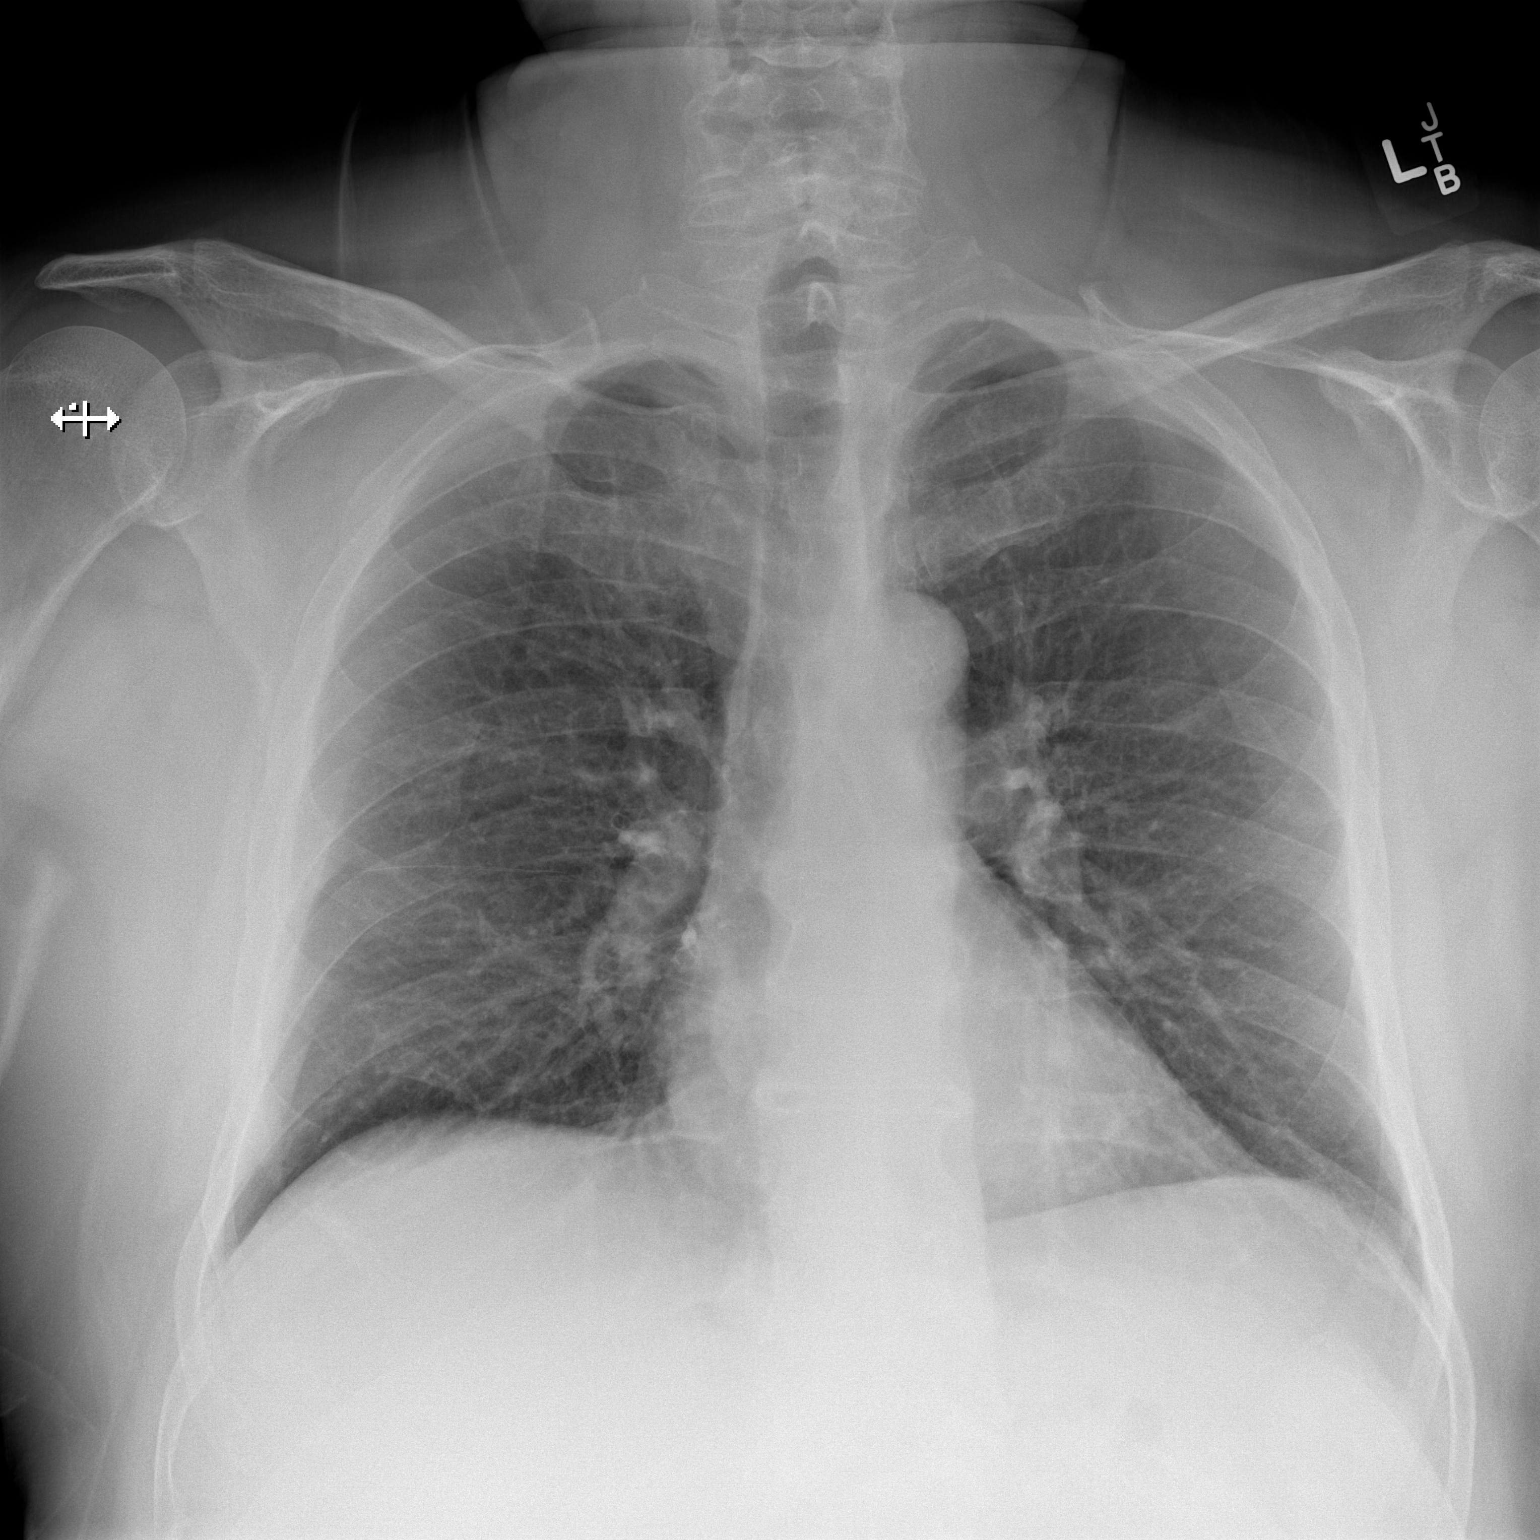

[w chest lat]
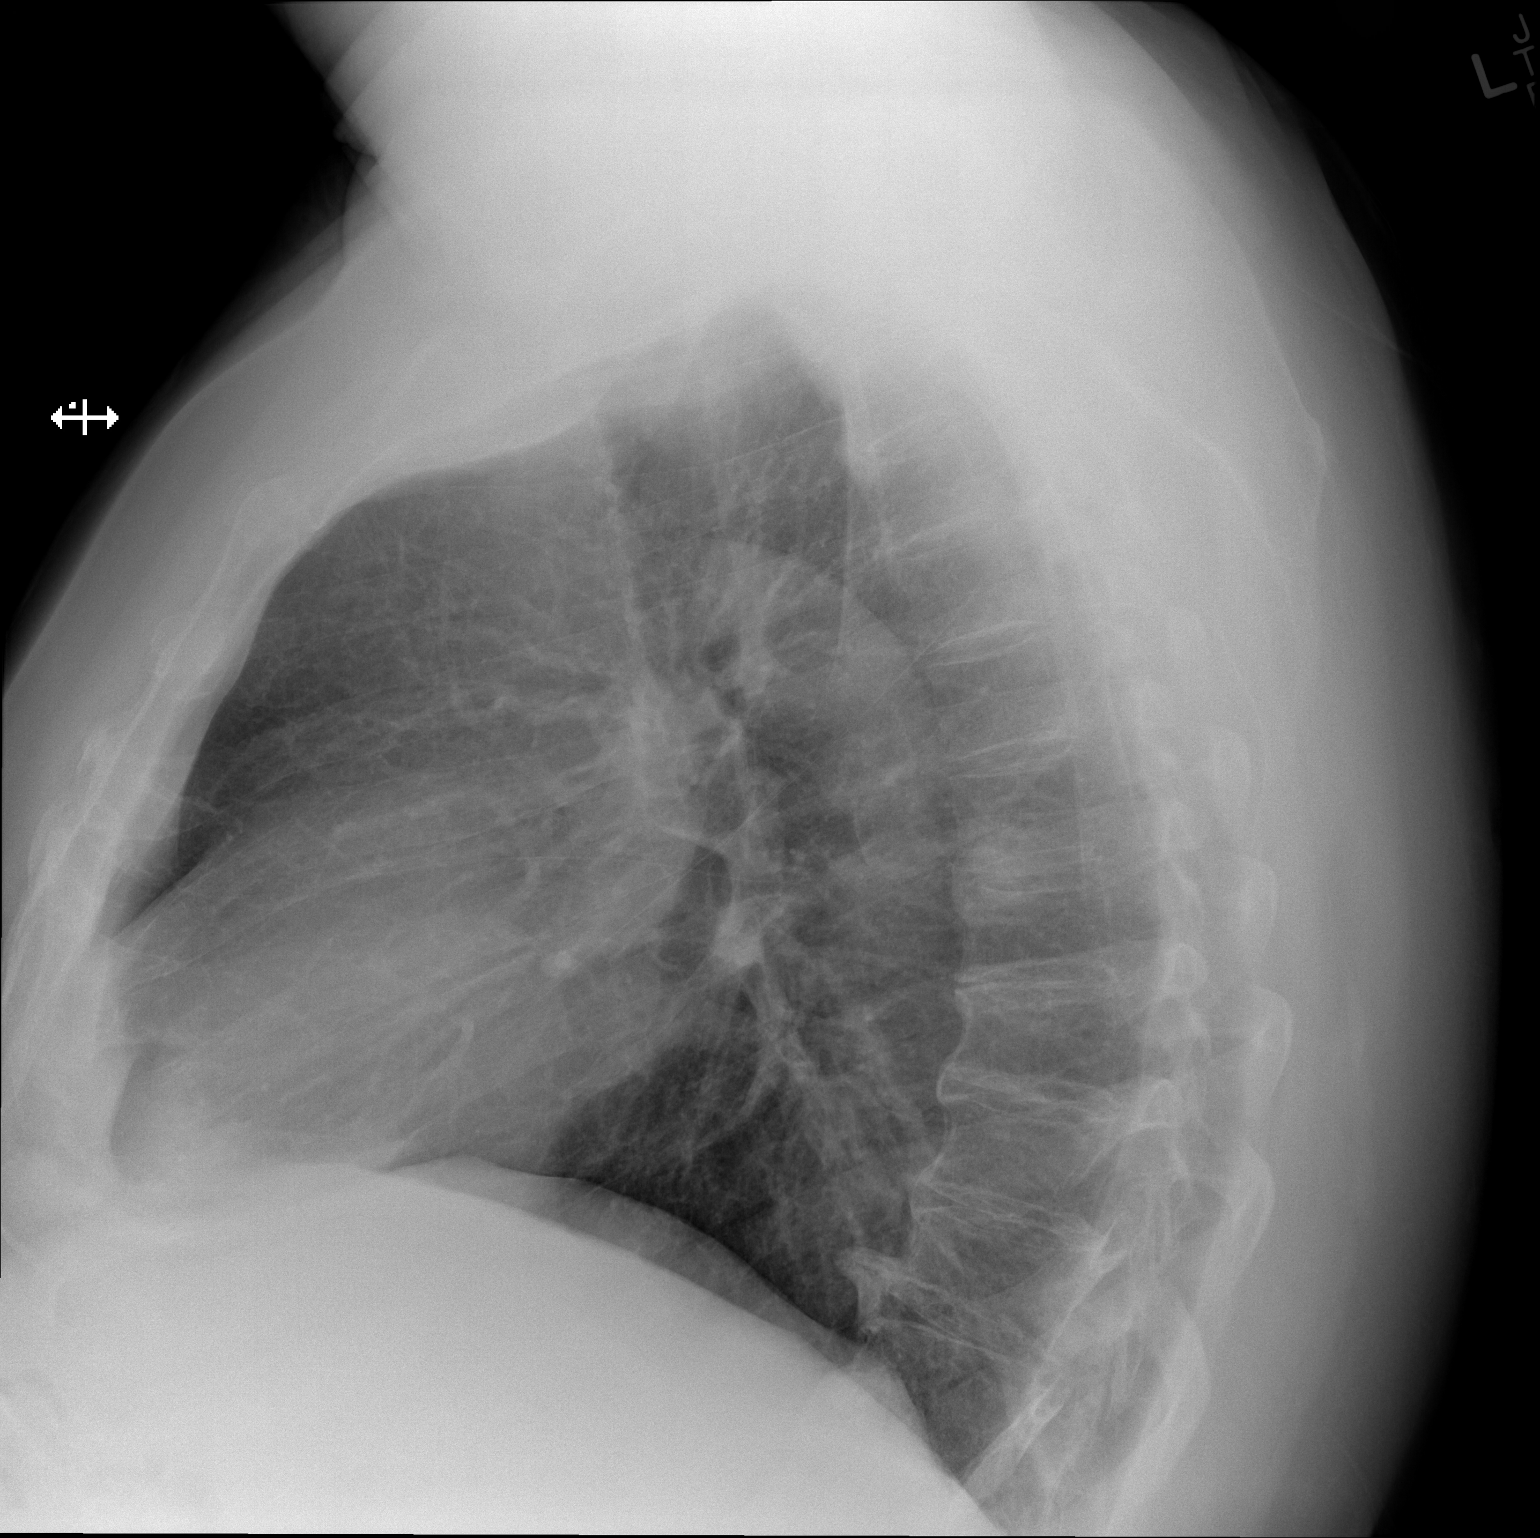

[2 of 2 positions shown; findings below may reference images not displayed]

FINDINGS: The lungs are clear and slightly hyperaerated. Mediastinal and hilar
contours are unremarkable. The heart is within normal limits in
size. There are degenerative changes throughout the mid to lower
thoracic spine.
IMPRESSION: Slight hyper aeration.  No active lung disease.

## 2016-10-03 ENCOUNTER — Ambulatory Visit: Payer: 59 | Admitting: Cardiovascular Disease

## 2016-10-21 ENCOUNTER — Telehealth: Payer: Self-pay | Admitting: Nurse Practitioner

## 2016-10-21 ENCOUNTER — Other Ambulatory Visit: Payer: Commercial Managed Care - PPO

## 2016-10-21 ENCOUNTER — Telehealth: Payer: Self-pay | Admitting: Cardiovascular Disease

## 2016-10-21 ENCOUNTER — Encounter (INDEPENDENT_AMBULATORY_CARE_PROVIDER_SITE_OTHER): Payer: Self-pay

## 2016-10-21 ENCOUNTER — Encounter: Payer: Self-pay | Admitting: Cardiovascular Disease

## 2016-10-21 ENCOUNTER — Ambulatory Visit (INDEPENDENT_AMBULATORY_CARE_PROVIDER_SITE_OTHER): Payer: Commercial Managed Care - PPO | Admitting: Cardiovascular Disease

## 2016-10-21 VITALS — BP 106/78 | HR 59 | Ht 75.0 in | Wt 261.0 lb

## 2016-10-21 DIAGNOSIS — I2510 Atherosclerotic heart disease of native coronary artery without angina pectoris: Secondary | ICD-10-CM

## 2016-10-21 DIAGNOSIS — I951 Orthostatic hypotension: Secondary | ICD-10-CM

## 2016-10-21 LAB — COMPREHENSIVE METABOLIC PANEL
ALBUMIN: 4.7 g/dL (ref 3.6–5.1)
ALT: 27 U/L (ref 9–46)
AST: 24 U/L (ref 10–35)
Alkaline Phosphatase: 52 U/L (ref 40–115)
BUN: 18 mg/dL (ref 7–25)
CHLORIDE: 99 mmol/L (ref 98–110)
CO2: 27 mmol/L (ref 20–31)
Calcium: 9.6 mg/dL (ref 8.6–10.3)
Creat: 1.47 mg/dL — ABNORMAL HIGH (ref 0.70–1.25)
Glucose, Bld: 119 mg/dL — ABNORMAL HIGH (ref 65–99)
POTASSIUM: 3.2 mmol/L — AB (ref 3.5–5.3)
Sodium: 139 mmol/L (ref 135–146)
TOTAL PROTEIN: 7.3 g/dL (ref 6.1–8.1)
Total Bilirubin: 1.6 mg/dL — ABNORMAL HIGH (ref 0.2–1.2)

## 2016-10-21 NOTE — Telephone Encounter (Signed)
-----   Message from Thayer Headings, MD sent at 10/21/2016  4:38 PM EST ----- His creatinine is slightly elevated. His potassium is low. His Maxide has been held. Please recheck a basic metabolic profile in 2 weeks.

## 2016-10-21 NOTE — Telephone Encounter (Signed)
Pt c/o Syncope: STAT if syncope occurred within 30 minutes and pt complains of lightheadedness High Priority if episode of passing out, completely, today or in last 24 hours   Did you pass out today? no When is the last time you passed out? no 1. Has this occurred multiple times? no  2. Did you have any symptoms prior to passing out? No   Pt is extremely dizzy and he has to grab hold of items to keep his balance

## 2016-10-21 NOTE — Progress Notes (Signed)
Matthew Love, [name]  [address] Phone: [phone] Fax:  [phone]  Date:  10/21/2016   Name:  Matthew Love   DOB:  16-Jul-1951   MRN:  [id number]  PCP:  Geoffery Lyons, MD  Primary Cardiologist:  Dr. Liam Rogers  Primary Electrophysiologist:  None    History of Present Illness: Matthew Love is a 65 y.o. male who returns for evaluation of chest pain.  He has a history of HTN, DM2, HL, GERD and sleep apnea. He is noncompliant with CPAP.   Echo 11/11: EF 123456, grade 1 diastolic dysfunction, mild LAE.  ETT-Myoview 11/11: Inferior fixed defect likely due to diaphragmatic attenuation with normal wall motion, EF 69%.   Over the last 2-3 months, he has noted worsening left-sided chest pain. He can point to it with one finger. It feels dull. He denies any exertional chest pain. He notes it only at rest. He denies any radiating symptoms or associated nausea, diaphoresis. He denies associated shortness of breath. He does have dyspnea with more extreme activities. He denies orthopnea, PND or significant pedal edema. He has had a cough over last several months. This is productive of clear sputum. He was prescribed a Z-Pak at one point with his primary care provider. He denies any recent travels, injuries to his lower extremities or recent hospitalizations.  He denies pleuritic chest pain or chest pain with lying supine.  Sept. 17, 2013 -   He was seen by [name] in August for chest pain. A stress Myoview study was normal. He increase his Nexium and the mild chest discomfort improved.  Sept. 19, 2016:  Doing well  . Retired from [employer]   Exercising  Some .   Walking 30 minutes every AM.   His foot and toes on left side get very cold   Sept. 21, 2017:  Matthew Love is seen today for a work in visit Had some chest pain  Center of chest.    Seemed to be worse when he put his hand on his chest  Awoke last night with some left sided chest  pain  Usually occurs when he is lying down. Walking 20 minutes a day -  Has gained about 10 lbs over the past month    Nov. 20, 2017:    [name] is seen back .  He had stenting of his proximal/mid LAD and stenting of his distal  in October, 2017. Matthew Love Since that time he's had some lightheadedness / dizziness. Had a GI bug this past weekend.   Had some diarrhea and poor appetite.   He has improved his diet. Has lost weight - 25-30 lbs since his the stent procedure .  Is avoiding salt.    Wt Readings from Last 3 Encounters:  10/21/16 261 lb (118.4 kg)  09/10/16 280 lb 12.8 oz (127.4 kg)  09/07/16 276 lb 14.4 oz (125.6 kg)     Past Medical History:  Diagnosis Date  . Anxiety   . Arthritis   . Coronary artery disease    a. NST 08/2016: high-risk w/ inferior and lateral wall ischemia  b. cath 09/2016: 80% mid-LAD and 90% distal LAD stenosis. Placement of 2 DES. Mild nonobstructive dz along RCA and LCx.  . Diabetes mellitus    A1C 6 weeks ago as of 09/08/14 was 6.2. Not on oral meds.  Matthew Love GERD (gastroesophageal reflux disease)   . H/O cardiovascular stress test    ETT-Myoview 11/11: Inferior fixed defect  likely due to diaphragmatic attenuation with normal wall motion, EF 69%  . H/O echocardiogram    Echo 11/11: EF 123456, grade 1 diastolic dysfunction, mild LAE  . H/O hiatal hernia   . Hyperlipidemia   . Hypertension   . Mini stroke ([place])   . Obesity   . Pneumonia    hx  . Sleep apnea    cpap 1 yr  . Stroke [facility]) 10    Current Outpatient Prescriptions  Medication Sig Dispense Refill  . aspirin 81 MG tablet Take 81 mg by mouth daily.      Matthew Love atorvastatin (LIPITOR) 40 MG tablet Take 1 tablet (40 mg total) by mouth daily at 6 PM. 30 tablet 6  . escitalopram (LEXAPRO) 10 MG tablet Take 10 mg by mouth daily.     Matthew Love esomeprazole (NEXIUM) 20 MG capsule Take 20 mg by mouth every morning.    . Multiple Vitamin (MULTIVITAMIN) tablet Take 1 tablet by mouth daily.      . nitroGLYCERIN  (NITROSTAT) 0.4 MG SL tablet Place 1 tablet (0.4 mg total) under the tongue every 5 (five) minutes as needed for chest pain. 25 tablet 3  . OMEGA 3 1200 MG CAPS Take 1,200 mg by mouth daily.     . ticagrelor (BRILINTA) 90 MG TABS tablet Take 1 tablet (90 mg total) by mouth 2 (two) times daily. 60 tablet 10  . triamterene-hydrochlorothiazide (MAXZIDE-25) 37.5-25 MG per tablet Take 1 each (1 tablet total) by mouth daily. 30 tablet 0  . valsartan (DIOVAN) 160 MG tablet Take 160 mg by mouth daily.    Matthew Love EPINEPHrine (EPIPEN) 0.3 mg/0.3 mL SOAJ injection Inject 0.3 mLs (0.3 mg total) into the muscle once. For signs or symptoms of anaphylaxis. (Patient not taking: Reported on 10/21/2016) 1 Device 0   No current facility-administered medications for this visit.     Allergies: Allergies  Allergen Reactions  . Bee Venom Anaphylaxis, Itching and Swelling  . Hydrocodone-Acetaminophen Rash    Social History  Substance Use Topics  . Smoking status: Former Smoker    Packs/day: 3.50    Years: 20.00    Types: Cigarettes    Quit date: 12/02/1984  . Smokeless tobacco: Never Used  . Alcohol use No     Comment: quit 85     ROS:  Please see the history of present illness.     All other systems reviewed and negative.   PHYSICAL EXAM: VS:  BP 106/78 (BP Location: Left Arm, Patient Position: Sitting, Cuff Size: Large)   Pulse (!) 59   Ht 6\' 3"  (1.905 m)   Wt 261 lb (118.4 kg)   BMI 32.62 kg/m  Repeat blood pressure by me 120/80  Well nourished, well developed, in no acute distress  HEENT: normal  Neck: no JVD Vascular: No carotid bruits Endocrine: No thyromegaly  Cardiac:  normal S1, S2; RRR; no murmur  Lungs:  clear to auscultation bilaterally, no wheezing, rhonchi or rales  Abd: soft, nontender, no hepatomegaly  Ext: no edema  Skin: warm and dry  Neuro:  CNs 2-12 intact, no focal abnormalities noted  EKG:    Nov. 20, 2017:   Sinus brady at 59.     ASSESSMENT AND PLAN:  1.    CAD :   S/p  stenting of his prox/mid LAD  He is not having any chest pain .  2. Dizziness:  I suspect this is due to orthostatic hypotension.  He states that he's been dizzy since  his stent procedure. Since that procedure he has lost about 25 pounds and is not eating any salty foods. I suspect that his dizziness is due to low blood pressure. We will stop the Maxzide. He will record his blood pressure and call us back and let us know how he is feeling   Hypertension:  BP is well controlled. Continue current meds.   2. DM type 2 - followed by Dr. Reynaldo Minium  3.  Hyperlipidemia: - managed by his primary MD  4. Sleep apnea.  Will see him again in 3 months      Mertie Moores, MD  10/21/2016 11:51 AM    [place],  [place], [place]  [address] Pager [phone] Phone: [phone]; Fax: [phone]   [facility]  [address] [place], [place]  [address] [phone]   Fax [phone]

## 2016-10-21 NOTE — Telephone Encounter (Signed)
Left patient a detailed message regarding lab results and to increase intake of dietary K+.  I advised lab appointment is scheduled for 12/5 and to call back with questions or concerns.

## 2016-10-21 NOTE — Telephone Encounter (Signed)
Spoke with patient's wife, [name], who states patient has been having dizziness and fatigue x 7-10 days.  She states he has had decreased appetite and had a bout of diarrhea on Friday and Saturday.  Dizziness was present before diarrhea.  She states she checked his BP one day and it was 120/75 mmHg.  States he has not complained of dark or coffee-ground stool.  I scheduled patient for office visit today with Dr. Acie Fredrickson.  She thanked me for the call.

## 2016-10-21 NOTE — Patient Instructions (Addendum)
Medication Instructions:  STOP Maxzide (Triamterene/HCTZ)  Labwork: TODAY - cholesterol, complete metabolic panel   Testing/Procedures: None Ordered   Follow-Up: Your physician recommends that you schedule a follow-up appointment in: 3 months with Dr. Acie Fredrickson.    If you need a refill on your cardiac medications before your next appointment, please call your pharmacy.   Thank you for choosing [facility]! Christen Bame, RN [phone]

## 2016-10-22 ENCOUNTER — Other Ambulatory Visit: Payer: Commercial Managed Care - PPO

## 2016-10-29 ENCOUNTER — Telehealth: Payer: Self-pay | Admitting: Cardiovascular Disease

## 2016-10-29 NOTE — Telephone Encounter (Signed)
Spoke with patient who called to give BP/pulse readings as requested at last office visit 11/20.  He reports he is feeling better; states no more dizziness since stopping Maxzide.  He reports 6 lb weight gain and is unsure as to the cause of this weight gain.  Vital sign readings reported: 11/20 8pm 123/68, pulse 55 bpm 11/21 8 pm, 128/72, 54 11/22 7 pm, 124/76, 54 11/23 9 pm, 121/67, 50 11/24 7 pm, 121/74, 49 11/25 5 pm, 109/68, 55 11/26 5 pm, 118/66, 52 11/27 6 pm, 123/70, 56  I advised that I will review with Dr. Acie Fredrickson and call him back with Dr. Elmarie Shiley advice.  He verbalized understanding and agreement with plan.

## 2016-10-29 NOTE — Telephone Encounter (Signed)
BP and HR readings are stable Continue current meds

## 2016-10-29 NOTE — Telephone Encounter (Signed)
Mr.Vankleeck is returning your call . Thanks

## 2016-10-29 NOTE — Telephone Encounter (Signed)
Spoke with patient and reviewed Dr. Elmarie Shiley advice. He states he does not eat a high salt diet; states he does not eat much each day, even on Thanksgiving. I advised him to call back if weight continues to increase. He is scheduled for follow-up in February 2018 with Dr. Acie Fredrickson and I advised him to call back sooner with questions or concerns. He thanked me for the call.

## 2016-11-05 ENCOUNTER — Other Ambulatory Visit: Payer: Commercial Managed Care - PPO

## 2016-12-09 ENCOUNTER — Ambulatory Visit: Payer: Commercial Managed Care - PPO | Admitting: Cardiovascular Disease

## 2016-12-23 ENCOUNTER — Encounter: Payer: Self-pay | Admitting: Cardiovascular Disease

## 2016-12-23 ENCOUNTER — Telehealth: Payer: Self-pay | Admitting: Cardiovascular Disease

## 2016-12-23 NOTE — Telephone Encounter (Signed)
Continue Triamterine / HCTZ  Continue to monitor BP

## 2016-12-23 NOTE — Telephone Encounter (Signed)
Pt states he restarted Triamterene 37.5/25mg  daily 12/21/16 due to elevated BP. Pt states Friday from 11:30AM-2PM he felt weird, didn't feel good, got hot, pt denies chest pain/discomfort/shortness of breath. Pt states he was shoveling snow earlier in the morning and did not have symptoms at the time he was shoveling snow.  Pt states pt's wife checked BP Friday evening and it was high, 165/89. Pt states BP Saturday 157/87 and 161/88-he restarted Triamterene.  Pt states BP this morning 130/88.   Pt denies any symptoms except feeling not quite right when leaning back in the chair this morning at breakfast.  Pt is asking if Dr Acie Fredrickson agrees with restarting Triamterene and if he has any further recommendations.  Pt advised I will forward to Dr Acie Fredrickson for review    Matthew Love  to Thayer Headings, MD       12/23/16 2:47 PM  I shoveled snow Friday, 1/19 and got to feeling pretty bad and it took a few hours to get some of my strength back. When Dianne got home she took my blood pressure and it was 165/89. She took it again Saturday morning and it was still high 158/87 and Saturday at [place] took it again in the pharmacy dept and it was 161/88. I took a Triamterene (you took me off of this a while back because my bp was low) Saturday afternoon and again Sunday and again today. Today it's running 135/80 and again I'm not feeling so great, not near as bad as I did on Friday but just not feeling right.   Wanted to make sure that I'm doing the right thing.

## 2016-12-23 NOTE — Telephone Encounter (Signed)
Pt advised, verbalized understanding,will take and record BP daily, call in 10 days with readings.

## 2016-12-23 NOTE — Telephone Encounter (Signed)
Matthew Love is requesting a call back, she states that "something came up over the weekend." Please call, thanks.

## 2016-12-23 NOTE — Telephone Encounter (Signed)
Pt states he restarted Triamterene 37.5/25mg  daily 12/21/16 due to elevated BP. Pt states Friday from 11:30AM-2PM he felt weird, didn't feel good, got hot, pt denies chest pain/discomfort/shortness of breath. Pt states he was shoveling snow earlier in the morning and did not have symptoms at the time he was shoveling snow.  Pt states pt's wife checked BP Friday evening and it was high, 165/89. Pt states BP Saturday 157/87 and 161/88-he restarted Triamterene.  Pt states BP this morning 130/88.   Pt denies any symptoms except feeling not quite right when leaning back in the chair this morning at breakfast.  Pt is asking if Dr Acie Fredrickson agrees with restarting Triamterene and if he has any further recommendations.  Pt advised I will forward to Dr Acie Fredrickson for review

## 2016-12-24 NOTE — Telephone Encounter (Signed)
Called patient to assess how he is feeling since restarting Triamterene.  He reports BP and pulse as follows: 0615 BP 141/85 mmHg, HR 69 bpm 1015 BP 125/72 mmHg, HR 77 bpm States when he got the trash can in from the street, he did not feel like he had very much energy but states he did not feel as poor as he did when he shoveled snow.  He denies chest pressure or discomfort.  We discussed his most recent cardiac cath and he reports that he is taking medications as directed. He did not get repeat bmet in December as advised and states he is getting lab work at PCP appointment soon.  I reminded him to consume dietary K+ daily and to stay hydrated. I offered him an appointment this week with Dr. Acie Fredrickson or APP and he states he would like to continue to monitor his symptoms.  He agrees to continue to monitor BP and pulse and to call back if symptoms worsen.  He is currently scheduled for follow up in February.  I advised him to call back with worsening symptoms or other concerns. He thanked me for the call.

## 2016-12-25 ENCOUNTER — Other Ambulatory Visit: Payer: Self-pay | Admitting: Nurse Practitioner

## 2016-12-25 MED ORDER — TRIAMTERENE-HCTZ 37.5-25 MG PO CAPS: 1.0000 | ORAL_CAPSULE | Freq: Every day | ORAL | 3 refills | Status: DC

## 2016-12-25 NOTE — Telephone Encounter (Signed)
Continue with the plan . Sound like he is feeling better.

## 2016-12-25 NOTE — Progress Notes (Signed)
Patient has resumed triamterene 37.5/25 mg for elevated BP. I have added it back to patient's medication list.

## 2017-01-24 DIAGNOSIS — I10 Essential (primary) hypertension: Secondary | ICD-10-CM | POA: Diagnosis not present

## 2017-01-24 DIAGNOSIS — E119 Type 2 diabetes mellitus without complications: Secondary | ICD-10-CM | POA: Diagnosis not present

## 2017-01-24 DIAGNOSIS — E784 Other hyperlipidemia: Secondary | ICD-10-CM | POA: Diagnosis not present

## 2017-01-24 DIAGNOSIS — Z125 Encounter for screening for malignant neoplasm of prostate: Secondary | ICD-10-CM | POA: Diagnosis not present

## 2017-01-27 ENCOUNTER — Encounter: Payer: Self-pay | Admitting: Cardiovascular Disease

## 2017-01-27 ENCOUNTER — Ambulatory Visit (INDEPENDENT_AMBULATORY_CARE_PROVIDER_SITE_OTHER): Payer: Medicare Other | Admitting: Cardiovascular Disease

## 2017-01-27 ENCOUNTER — Ambulatory Visit: Payer: Commercial Managed Care - PPO | Admitting: Cardiovascular Disease

## 2017-01-27 VITALS — BP 122/78 | HR 68 | Ht 74.0 in | Wt 239.4 lb

## 2017-01-27 DIAGNOSIS — I25110 Atherosclerotic heart disease of native coronary artery with unstable angina pectoris: Secondary | ICD-10-CM

## 2017-01-27 DIAGNOSIS — I951 Orthostatic hypotension: Secondary | ICD-10-CM

## 2017-01-27 DIAGNOSIS — I200 Unstable angina: Secondary | ICD-10-CM | POA: Diagnosis not present

## 2017-01-27 NOTE — Patient Instructions (Signed)
Medication Instructions:  Your physician recommends that you continue on your current medications as directed. Please refer to the Current Medication list given to you today.   Labwork: None Ordered   Testing/Procedures: None Ordered   Follow-Up: Your physician wants you to follow-up in: 6 months with Dr. Nahser.  You will receive a reminder letter in the mail two months in advance. If you don't receive a letter, please call our office to schedule the follow-up appointment.   If you need a refill on your cardiac medications before your next appointment, please call your pharmacy.   Thank you for choosing [facility]! Shalinda Burkholder, RN [phone]    

## 2017-01-27 NOTE — Progress Notes (Signed)
Matthew Love, [place]  [address] Phone: [phone] Fax:  [phone]  Date:  01/27/2017   Name:  ARCH PLANT   DOB:  1951-07-31   MRN:  [id number]  PCP:  Geoffery Lyons, MD  Primary Cardiologist:  Dr. Liam Rogers  Primary Electrophysiologist:  None    History of Present Illness: Matthew Love is a 66 y.o. male who returns for evaluation of chest pain.  He has a history of HTN, DM2, HL, GERD and sleep apnea. He is noncompliant with CPAP.   Echo 11/11: EF 123456, grade 1 diastolic dysfunction, mild LAE.  ETT-Myoview 11/11: Inferior fixed defect likely due to diaphragmatic attenuation with normal wall motion, EF 69%.   Over the last 2-3 months, he has noted worsening left-sided chest pain. He can point to it with one finger. It feels dull. He denies any exertional chest pain. He notes it only at rest. He denies any radiating symptoms or associated nausea, diaphoresis. He denies associated shortness of breath. He does have dyspnea with more extreme activities. He denies orthopnea, PND or significant pedal edema. He has had a cough over last several months. This is productive of clear sputum. He was prescribed a Z-Pak at one point with his primary care provider. He denies any recent travels, injuries to his lower extremities or recent hospitalizations.  He denies pleuritic chest pain or chest pain with lying supine.  Sept. 17, 2013 -   He was seen by Matthew Love in August for chest pain. A stress Myoview study was normal. He increase his Nexium and the mild chest discomfort improved.  Sept. 19, 2016:  Doing well  . Retired from [employer]   Exercising  Some .   Walking 30 minutes every AM.   His foot and toes on left side get very cold   Sept. 21, 2017:  Matthew Love is seen today for a work in visit Had some chest pain  Center of chest.    Seemed to be worse when he put his hand on his chest  Awoke last night with some left sided chest  pain  Usually occurs when he is lying down. Walking 20 minutes a day -  Has gained about 10 lbs over the past month    Nov. 20, 2017:    Matthew Love is seen back .  He had stenting of his proximal/mid LAD and stenting of his distal  in October, 2017. [name] Since that time he's had some lightheadedness / dizziness. Had a GI bug this past weekend.   Had some diarrhea and poor appetite.   He has improved his diet. Has lost weight - 25-30 lbs since his the stent procedure .  Is avoiding salt.    Feb. 26, 2018:  Has lost another 40 lbs. Is watching his diet  No CP .    Wt Readings from Last 3 Encounters:  01/27/17 239 lb 6.4 oz (108.6 kg)  10/21/16 261 lb (118.4 kg)  09/10/16 280 lb 12.8 oz (127.4 kg)     Past Medical History:  Diagnosis Date  . Anxiety   . Arthritis   . Coronary artery disease    a. NST 08/2016: high-risk w/ inferior and lateral wall ischemia  b. cath 09/2016: 80% mid-LAD and 90% distal LAD stenosis. Placement of 2 DES. Mild nonobstructive dz along RCA and LCx.  . Diabetes mellitus    A1C 6 weeks ago as of 09/08/14 was 6.2. Not on oral meds.  [name]  GERD (gastroesophageal reflux disease)   . H/O cardiovascular stress test    ETT-Myoview 11/11: Inferior fixed defect likely due to diaphragmatic attenuation with normal wall motion, EF 69%  . H/O echocardiogram    Echo 11/11: EF 123456, grade 1 diastolic dysfunction, mild LAE  . H/O hiatal hernia   . Hyperlipidemia   . Hypertension   . Mini stroke ([place])   . Obesity   . Pneumonia    hx  . Sleep apnea    cpap 1 yr  . Stroke [facility]) 10    Current Outpatient Prescriptions  Medication Sig Dispense Refill  . aspirin 81 MG tablet Take 81 mg by mouth daily.      [name] atorvastatin (LIPITOR) 40 MG tablet Take 1 tablet (40 mg total) by mouth daily at 6 PM. 30 tablet 6  . EPINEPHrine (EPIPEN) 0.3 mg/0.3 mL SOAJ injection Inject 0.3 mLs (0.3 mg total) into the muscle once. For signs or symptoms of anaphylaxis. 1 Device 0  .  escitalopram (LEXAPRO) 10 MG tablet Take 10 mg by mouth daily.     [name] esomeprazole (NEXIUM) 20 MG capsule Take 20 mg by mouth every morning.    . Multiple Vitamin (MULTIVITAMIN) tablet Take 1 tablet by mouth daily.      . nitroGLYCERIN (NITROSTAT) 0.4 MG SL tablet Place 0.4 mg under the tongue every 5 (five) minutes as needed for chest pain.    [name] OMEGA 3 1200 MG CAPS Take 1,200 mg by mouth daily.     . ticagrelor (BRILINTA) 90 MG TABS tablet Take 1 tablet (90 mg total) by mouth 2 (two) times daily. 60 tablet 10  . triamterene-hydrochlorothiazide (DYAZIDE) 37.5-25 MG capsule Take 1 each (1 capsule total) by mouth daily. 90 capsule 3  . valsartan (DIOVAN) 160 MG tablet Take 160 mg by mouth daily.     No current facility-administered medications for this visit.     Allergies: Allergies  Allergen Reactions  . Bee Venom Anaphylaxis, Itching and Swelling  . Hydrocodone-Acetaminophen Rash    Social History  Substance Use Topics  . Smoking status: Former Smoker    Packs/day: 3.50    Years: 20.00    Types: Cigarettes    Quit date: 12/02/1984  . Smokeless tobacco: Never Used  . Alcohol use No     Comment: quit 85     ROS:  Please see the history of present illness.     All other systems reviewed and negative.   PHYSICAL EXAM: VS:  BP 122/78   Pulse 68   Ht 6\' 2"  (1.88 m)   Wt 239 lb 6.4 oz (108.6 kg)   SpO2 98%   BMI 30.74 kg/m  Repeat blood pressure by me 120/80  Well nourished, well developed, in no acute distress  HEENT: normal  Neck: no JVD Vascular: No carotid bruits Endocrine: No thyromegaly  Cardiac:  normal S1, S2; RRR; no murmur  Lungs:  clear to auscultation bilaterally, no wheezing, rhonchi or rales  Abd: soft, nontender, no hepatomegaly  Ext: no edema  Skin: warm and dry  Neuro:  CNs 2-12 intact, no focal abnormalities noted  EKG:    Nov. 20, 2017:   Sinus brady at 59.     ASSESSMENT AND PLAN:  1.    CAD :   S/p stenting of his prox/mid LAD  ( Oct. 6, 2017)   He is not having any chest pain.    2. Dizziness:  I suspect this is due to orthostatic  hypotension. Overall this is better.   Will decrease his Valsartan if his BP falls further  I told him that we would likely be able to decrease his BP meds as he lost weight Encouraged more weight loss   3. Hypertension:  BP is well controlled. Continue current meds.   4.  Hyperlipidemia: - managed by his primary MD  5. Sleep apnea.  Will see him again in  6 months      Mertie Moores, MD  01/27/2017 8:25 AM    [place],  [place], [place]  [address] Pager [phone] Phone: [phone]; Fax: [phone]

## 2017-01-29 DIAGNOSIS — F418 Other specified anxiety disorders: Secondary | ICD-10-CM | POA: Diagnosis not present

## 2017-01-29 DIAGNOSIS — Z6831 Body mass index (BMI) 31.0-31.9, adult: Secondary | ICD-10-CM | POA: Diagnosis not present

## 2017-01-29 DIAGNOSIS — K219 Gastro-esophageal reflux disease without esophagitis: Secondary | ICD-10-CM | POA: Diagnosis not present

## 2017-01-29 DIAGNOSIS — E119 Type 2 diabetes mellitus without complications: Secondary | ICD-10-CM | POA: Diagnosis not present

## 2017-01-29 DIAGNOSIS — G4730 Sleep apnea, unspecified: Secondary | ICD-10-CM | POA: Diagnosis not present

## 2017-01-29 DIAGNOSIS — E784 Other hyperlipidemia: Secondary | ICD-10-CM | POA: Diagnosis not present

## 2017-01-29 DIAGNOSIS — Z23 Encounter for immunization: Secondary | ICD-10-CM | POA: Diagnosis not present

## 2017-01-29 DIAGNOSIS — Z0000 Encounter for general adult medical examination without abnormal findings: Secondary | ICD-10-CM | POA: Diagnosis not present

## 2017-01-29 DIAGNOSIS — I10 Essential (primary) hypertension: Secondary | ICD-10-CM | POA: Diagnosis not present

## 2017-01-29 DIAGNOSIS — N529 Male erectile dysfunction, unspecified: Secondary | ICD-10-CM | POA: Diagnosis not present

## 2017-01-29 DIAGNOSIS — E669 Obesity, unspecified: Secondary | ICD-10-CM | POA: Diagnosis not present

## 2017-02-14 DIAGNOSIS — Z1212 Encounter for screening for malignant neoplasm of rectum: Secondary | ICD-10-CM | POA: Diagnosis not present

## 2017-03-18 ENCOUNTER — Other Ambulatory Visit: Payer: Self-pay | Admitting: Student

## 2017-04-07 ENCOUNTER — Telehealth: Payer: Self-pay | Admitting: Cardiovascular Disease

## 2017-04-07 NOTE — Telephone Encounter (Signed)
Agree with note my Michelle Swinyer, RN  

## 2017-04-07 NOTE — Telephone Encounter (Signed)
New Message   pt wife verbalized that she is calling for rn   And she didn't disclose the reason she said that she will talk to rn about it

## 2017-04-07 NOTE — Telephone Encounter (Signed)
Spoke with patient's wife who states patient woke up 1 week ago with sharp pain under left breast which eased off after a few minutes. She states he remained sore for several hours but did not have recurrent episodes. She expresses concern about his heart stents and other blockages. I reviewed the cath findings from 10/17 with her and she expressed relief; she states she thought the other blockages were 80%, not 20-30%. I advised her to continue to monitor for future episodes of chest pain/tightness/discomfort and notify us. She states that after that occurrence he noted dizziness, light-headedness with picking up boxes. She states they are moving and he has been doing a lot of heavy lifting. She denies that he has chest pain/tightness or SOB with the lifting. She states he was sweating a lot during this time of dizziness and she encouraged him to drink Gatorade but he refused because of sodium content. She states his weight is in the 220's lb, down from 239 lb at last office visit in February. She does not have recent BP/pulse readings. I advised her to monitor BP and HR and that if readings are <110/60 or if patient continues to have dizziness/light-headedness to call back to inform us and to decrease Valsartan to 80 mg daily. I advised her to inform him that if he is sweating, that it may be appropriate to drink an electrolyte beverage. She verbalized understanding and agreement with plan and thanked me for the call.

## 2017-04-15 NOTE — Telephone Encounter (Signed)
Pt's wife called to report  pt's BP. She states that pt's diastolic  BP has been staying  Low in the 50 to 60's. Pt's  BP  Is  113/58 to 120/60,, HR 56 yesterday. Pt is not dizzy now, as  When he called on 5/7, but he has not much  energy. Wife is reporting the BP numbers because MD may wants to decreased the Valsartan medication.

## 2017-04-15 NOTE — Telephone Encounter (Signed)
These BP readings are all good and the diastolic readings are of no concern He is losing weight and his BP has generally declined Lets decrease the Valsartan to 80 mg a day

## 2017-04-15 NOTE — Telephone Encounter (Signed)
New Message   Per Wife Bottom number of bp is staying in the 50-60's range  113/58 she feels that may be to low

## 2017-04-16 MED ORDER — VALSARTAN 80 MG PO TABS: 80.0000 mg | ORAL_TABLET | Freq: Every day | ORAL | 3 refills | Status: DC

## 2017-04-16 NOTE — Telephone Encounter (Signed)
Reviewed Dr. Elmarie Shiley advice with patient's wife, [name], who verbalized understanding. She asked for 80 mg tablets to go to [place] in [place]. I advised her to continue to monitor patient's BP and heart rate and call back with questions or concerns. She thanked me for the call.

## 2017-04-30 DIAGNOSIS — E119 Type 2 diabetes mellitus without complications: Secondary | ICD-10-CM | POA: Diagnosis not present

## 2017-04-30 DIAGNOSIS — F418 Other specified anxiety disorders: Secondary | ICD-10-CM | POA: Diagnosis not present

## 2017-04-30 DIAGNOSIS — G4730 Sleep apnea, unspecified: Secondary | ICD-10-CM | POA: Diagnosis not present

## 2017-04-30 DIAGNOSIS — E668 Other obesity: Secondary | ICD-10-CM | POA: Diagnosis not present

## 2017-04-30 DIAGNOSIS — I10 Essential (primary) hypertension: Secondary | ICD-10-CM | POA: Diagnosis not present

## 2017-04-30 DIAGNOSIS — Z6831 Body mass index (BMI) 31.0-31.9, adult: Secondary | ICD-10-CM | POA: Diagnosis not present

## 2017-04-30 DIAGNOSIS — N528 Other male erectile dysfunction: Secondary | ICD-10-CM | POA: Diagnosis not present

## 2017-04-30 DIAGNOSIS — K219 Gastro-esophageal reflux disease without esophagitis: Secondary | ICD-10-CM | POA: Diagnosis not present

## 2017-04-30 DIAGNOSIS — E784 Other hyperlipidemia: Secondary | ICD-10-CM | POA: Diagnosis not present

## 2017-06-18 DIAGNOSIS — Z6831 Body mass index (BMI) 31.0-31.9, adult: Secondary | ICD-10-CM | POA: Diagnosis not present

## 2017-06-18 DIAGNOSIS — M546 Pain in thoracic spine: Secondary | ICD-10-CM | POA: Diagnosis not present

## 2017-07-07 ENCOUNTER — Telehealth: Payer: Self-pay | Admitting: Nurse Practitioner

## 2017-07-07 MED ORDER — IRBESARTAN 75 MG PO TABS: 75.0000 mg | ORAL_TABLET | Freq: Every day | ORAL | 11 refills | Status: DC

## 2017-07-07 NOTE — Telephone Encounter (Signed)
Left message for patient to call me back about changing valsartan to irbesartan due to manufacturer recall of valsartan.

## 2017-07-07 NOTE — Telephone Encounter (Signed)
Would recommend switching valsartan 80mg  daily to equivalent dose of irbesartan 75mg  daily. Would advise pt to monitor BP over the next few weeks to ensure it remains stable.

## 2017-07-07 NOTE — Telephone Encounter (Signed)
Routing to Pharmacy for advice on switching patient's Valsartan due to recall

## 2017-07-09 MED ORDER — IRBESARTAN 75 MG PO TABS: 75.0000 mg | ORAL_TABLET | Freq: Every day | ORAL | 11 refills | Status: DC

## 2017-07-09 NOTE — Telephone Encounter (Signed)
Pt's wife called to inform Dr. Acie Fredrickson that the pt's medication has been sent to the wrong pharmacy. I resent that Rx to the correct pharmacy and also corrected that pharmacies in pt's chart. Confirmation received. Wife also stated that she would call back to speak to Dr. Elmarie Shiley nurse, Sharyn Lull, RN.

## 2017-07-09 NOTE — Telephone Encounter (Signed)
Spoke with patient's wife, [name], who called to find out which medication the patient will take in the place of valsartan. She is aware that patient should start irbesartan 75 mg once daily and should monitor HR and BP. She asked to schedule his f/u with Dr. Acie Fredrickson that is due at the end of August. I scheduled patient for 10/1 and advised her to call back with questions or concerns prior to visit. She verbalized understanding and agreement and thanked me for the call.

## 2017-07-17 DIAGNOSIS — H1032 Unspecified acute conjunctivitis, left eye: Secondary | ICD-10-CM | POA: Diagnosis not present

## 2017-07-17 DIAGNOSIS — T1590XA Foreign body on external eye, part unspecified, unspecified eye, initial encounter: Secondary | ICD-10-CM | POA: Diagnosis not present

## 2017-08-04 DIAGNOSIS — Z23 Encounter for immunization: Secondary | ICD-10-CM | POA: Diagnosis not present

## 2017-08-13 ENCOUNTER — Other Ambulatory Visit: Payer: Self-pay | Admitting: Student

## 2017-09-01 ENCOUNTER — Ambulatory Visit (INDEPENDENT_AMBULATORY_CARE_PROVIDER_SITE_OTHER): Payer: Medicare Other | Admitting: Cardiovascular Disease

## 2017-09-01 ENCOUNTER — Encounter: Payer: Self-pay | Admitting: Cardiovascular Disease

## 2017-09-01 VITALS — BP 118/72 | HR 62 | Ht 74.5 in | Wt 243.4 lb

## 2017-09-01 DIAGNOSIS — I25110 Atherosclerotic heart disease of native coronary artery with unstable angina pectoris: Secondary | ICD-10-CM

## 2017-09-01 DIAGNOSIS — E782 Mixed hyperlipidemia: Secondary | ICD-10-CM | POA: Diagnosis not present

## 2017-09-01 DIAGNOSIS — I2510 Atherosclerotic heart disease of native coronary artery without angina pectoris: Secondary | ICD-10-CM | POA: Diagnosis not present

## 2017-09-01 LAB — SPECIMEN STATUS

## 2017-09-01 MED ORDER — TICAGRELOR 60 MG PO TABS
60.0000 mg | ORAL_TABLET | Freq: Two times a day (BID) | ORAL | 11 refills | Status: DC
Start: 2017-09-01 — End: 2017-10-03

## 2017-09-01 NOTE — Patient Instructions (Signed)
Medication Instructions:  DECREASE Brilinta to 60 mg twice daily   Labwork: TODAY - cholesterol, basic metabolic panel, liver panel   Testing/Procedures: None Ordered   Follow-Up: Your physician wants you to follow-up in: 6 months with Dr. Acie Fredrickson.  You will receive a reminder letter in the mail two months in advance. If you don't receive a letter, please call our office to schedule the follow-up appointment.   If you need a refill on your cardiac medications before your next appointment, please call your pharmacy.   Thank you for choosing [facility]! Christen Bame, RN [phone]

## 2017-09-01 NOTE — Progress Notes (Signed)
[address] [place], [place]  [address] Phone: [phone] Fax:  [phone]  Date:  09/01/2017   Name:  Matthew Love   DOB:  [birth year]   MRN:  [id number]  PCP:  Burnard Bunting, MD  Primary Cardiologist:  Dr. Liam Rogers  Primary Electrophysiologist:  None    History of Present Illness: Matthew Love is a 66 y.o. male who returns for evaluation of chest pain.  He has a history of HTN, DM2, HL, GERD and sleep apnea. He is noncompliant with CPAP.   Echo 11/11: EF 87-56%, grade 1 diastolic dysfunction, mild LAE.  ETT-Myoview 11/11: Inferior fixed defect likely due to diaphragmatic attenuation with normal wall motion, EF 69%.   Over the last 2-3 months, he has noted worsening left-sided chest pain. He can point to it with one finger. It feels dull. He denies any exertional chest pain. He notes it only at rest. He denies any radiating symptoms or associated nausea, diaphoresis. He denies associated shortness of breath. He does have dyspnea with more extreme activities. He denies orthopnea, PND or significant pedal edema. He has had a cough over last several months. This is productive of clear sputum. He was prescribed a Z-Pak at one point with his primary care provider. He denies any recent travels, injuries to his lower extremities or recent hospitalizations.  He denies pleuritic chest pain or chest pain with lying supine.  Sept. 17, 2013 -   He was seen by [name] in August for chest pain. A stress Myoview study was normal. He increase his Nexium and the mild chest discomfort improved.  Sept. 19, 2016:  Doing well  . Retired from [employer]   Exercising  Some .   Walking 30 minutes every AM.   His foot and toes on left side get very cold   Sept. 21, 2017:  Matthew Love is seen today for a work in visit Had some chest pain  Center of chest.    Seemed to be worse when he put his hand on his chest  Awoke last night with some left sided chest  pain  Usually occurs when he is lying down. Walking 20 minutes a day -  Has gained about 10 lbs over the past month    Nov. 20, 2017:    Matthew Love is seen back .  He had stenting of his proximal/mid LAD and stenting of his distal  in October, 2017. [name] Since that time he's had some lightheadedness / dizziness. Had a GI bug this past weekend.   Had some diarrhea and poor appetite.   He has improved his diet. Has lost weight - 25-30 lbs since his the stent procedure .  Is avoiding salt.    Feb. 26, 2018:  Has lost another 40 lbs. Is watching his diet  No CP .   Oct. 1, 2018:    Doing well.   No angina ,  Has MSK aches ,  No pain similar to her previous angina   , no dyspnea.  Not exercising as much.  Having some back issues.    Wt Readings from Last 3 Encounters:  09/01/17 243 lb 6.4 oz (110.4 kg)  01/27/17 239 lb 6.4 oz (108.6 kg)  10/21/16 261 lb (118.4 kg)     Past Medical History:  Diagnosis Date  . Anxiety   . Arthritis   . Coronary artery disease    a. NST 08/2016: high-risk w/ inferior and lateral wall ischemia  b. cath 09/2016: 80% mid-LAD and 90% distal LAD stenosis. Placement of 2 DES. Mild nonobstructive dz along RCA and LCx.  . Diabetes mellitus    A1C 6 weeks ago as of 09/08/14 was 6.2. Not on oral meds.  [name] GERD (gastroesophageal reflux disease)   . H/O cardiovascular stress test    ETT-Myoview 11/11: Inferior fixed defect likely due to diaphragmatic attenuation with normal wall motion, EF 69%  . H/O echocardiogram    Echo 11/11: EF 49-70%, grade 1 diastolic dysfunction, mild LAE  . H/O hiatal hernia   . Hyperlipidemia   . Hypertension   . Mini stroke ([place])   . Obesity   . Pneumonia    hx  . Sleep apnea    cpap 1 yr  . Stroke [facility]) 10    Current Outpatient Prescriptions  Medication Sig Dispense Refill  . aspirin 81 MG tablet Take 81 mg by mouth daily.      [name] atorvastatin (LIPITOR) 40 MG tablet TAKE ONE TABLET BY MOUTH ONCE DAILY AT  6  PM 30 tablet 6   . EPINEPHrine (EPIPEN) 0.3 mg/0.3 mL SOAJ injection Inject 0.3 mLs (0.3 mg total) into the muscle once. For signs or symptoms of anaphylaxis. 1 Device 0  . escitalopram (LEXAPRO) 10 MG tablet Take 10 mg by mouth daily.     . irbesartan (AVAPRO) 75 MG tablet Take 1 tablet (75 mg total) by mouth daily. 30 tablet 11  . Multiple Vitamin (MULTIVITAMIN) tablet Take 1 tablet by mouth daily.      . nitroGLYCERIN (NITROSTAT) 0.4 MG SL tablet Place 0.4 mg under the tongue every 5 (five) minutes as needed for chest pain.    [name] OMEGA 3 1200 MG CAPS Take 1,200 mg by mouth daily.     . pantoprazole (PROTONIX) 40 MG tablet Take 40 mg by mouth 2 (two) times daily.    . ticagrelor (BRILINTA) 60 MG TABS tablet Take 1 tablet (60 mg total) by mouth 2 (two) times daily. 60 tablet 11  . triamterene-hydrochlorothiazide (DYAZIDE) 37.5-25 MG capsule Take 1 capsule by mouth daily.     No current facility-administered medications for this visit.     Allergies: Allergies  Allergen Reactions  . Bee Venom Anaphylaxis, Itching and Swelling  . Hydrocodone-Acetaminophen Rash    Social History  Substance Use Topics  . Smoking status: Former Smoker    Packs/day: 3.50    Years: 20.00    Types: Cigarettes    Quit date: 12/02/1984  . Smokeless tobacco: Never Used  . Alcohol use No     Comment: quit 85     ROS:  Please see the history of present illness.     All other systems reviewed and negative.   Physical Exam: Blood pressure 118/72, pulse 62, height 6' 2.5" (1.892 m), weight 243 lb 6.4 oz (110.4 kg), SpO2 95 %.  GEN:  Well nourished, moderate obesity  HEENT: Normal NECK: No JVD; No carotid bruits LYMPHATICS: No lymphadenopathy CARDIAC: RRR , no murmurs, rubs, gallops RESPIRATORY:  Clear to auscultation without rales, wheezing or rhonchi  ABDOMEN: Soft, non-tender, non-distended MUSCULOSKELETAL:  No edema; No deformity  SKIN: Warm and dry NEUROLOGIC:  Alert and oriented x 3  EKG:    Oct. 1, 2018:   NSR  at 62.  LAD     ASSESSMENT AND PLAN:  1.    CAD :      He is now approximately one year out from his stent procedure. Will lower  the dose of Brilinta to 60 mg  PO BID  Been having some atypical episodes of chest pain but no pains that sound like angina no pains in her summer to his presenting episodes last year.  2.   Hypertension:   Blood pressure is well-controlled. Continue current medications    4.  Hyperlipidemia: -   We'll draw fasting CMET and lipid profile  today.  5. Sleep apnea.  Will see him again in  6 months      Mertie Moores, MD  09/01/2017 10:38 AM    [address],  [place], [place]  [address] Pager [phone] Phone: [phone]; Fax: [phone]

## 2017-09-02 ENCOUNTER — Telehealth: Payer: Self-pay | Admitting: Cardiovascular Disease

## 2017-09-02 NOTE — Telephone Encounter (Signed)
New message    Pt wife is calling asking for a call back about pt. She has a question about the blood work.

## 2017-09-02 NOTE — Telephone Encounter (Signed)
Clarified what occurred yesterday with our phlebotomist.  Explained to wife that lab did not mix the blood together.  As she was drawing his blood there was difficulty w/ pressure and filling the tube.  She had filled 3 small tubes, w/ small amt of blood, in hopes to cover each ordered lab, but in the end she was able to pull a full tube and threw the other ones away.  Clarified to wife that lab DID NOT mix blood together in a tube. Patient verbalized understanding and thanks me for clarifying.  She feels better knowing nothing was done wrong.

## 2017-09-02 NOTE — Telephone Encounter (Signed)
Spoke to wife, DPR on file.  She tells me that pt was in here yesterday and had blood work taken. States that her husband stated, "the lady taking it was extremely slow", "she was only getting a little bit in each tube".   Pt asked them if they should be doing it like that and they responded with, " your blood keeps stopping and starting, so I am going to mix these together in one tube so I can have a full tube.  Wife reports that she spoke to her friend's child, who is a Charity fundraiser, who told her that this is not how it should have been drawn and that they shouldn't have mixed blood tubes together like that. She is asking if this is correct and if his blood results would even be correct. Informed that I would investigate and call her back.

## 2017-09-11 LAB — HEPATIC FUNCTION PANEL
ALT: 24 IU/L (ref 0–44)
AST: 20 IU/L (ref 0–40)
Albumin: 4.7 g/dL (ref 3.6–4.8)
Alkaline Phosphatase: 58 IU/L (ref 39–117)
BILIRUBIN TOTAL: 1.1 mg/dL (ref 0.0–1.2)
BILIRUBIN, DIRECT: 0.3 mg/dL (ref 0.00–0.40)
TOTAL PROTEIN: 7.3 g/dL (ref 6.0–8.5)

## 2017-09-11 LAB — LIPID PANEL
CHOL/HDL RATIO: 2.2 ratio (ref 0.0–5.0)
Cholesterol, Total: 108 mg/dL (ref 100–199)
HDL: 50 mg/dL (ref >39)
LDL CALC: 36 mg/dL (ref 0–99)
Triglycerides: 109 mg/dL (ref 0–149)
VLDL CHOLESTEROL CAL: 22 mg/dL (ref 5–40)

## 2017-09-11 LAB — BASIC METABOLIC PANEL
BUN/Creatinine Ratio: 15 (ref 10–24)
BUN: 17 mg/dL (ref 8–27)
CALCIUM: 9.7 mg/dL (ref 8.6–10.2)
CHLORIDE: 101 mmol/L (ref 96–106)
CO2: 26 mmol/L (ref 20–29)
Creatinine, Ser: 1.12 mg/dL (ref 0.76–1.27)
GFR calc Af Amer: 79 mL/min/{1.73_m2} (ref >59)
GFR calc non Af Amer: 68 mL/min/{1.73_m2} (ref >59)
GLUCOSE: 105 mg/dL — AB (ref 65–99)
Potassium: 4 mmol/L (ref 3.5–5.2)
Sodium: 142 mmol/L (ref 134–144)

## 2017-09-17 ENCOUNTER — Other Ambulatory Visit: Payer: Self-pay | Admitting: Dermatology

## 2017-09-17 DIAGNOSIS — L821 Other seborrheic keratosis: Secondary | ICD-10-CM | POA: Diagnosis not present

## 2017-09-17 DIAGNOSIS — D225 Melanocytic nevi of trunk: Secondary | ICD-10-CM | POA: Diagnosis not present

## 2017-09-17 DIAGNOSIS — D1801 Hemangioma of skin and subcutaneous tissue: Secondary | ICD-10-CM | POA: Diagnosis not present

## 2017-09-17 DIAGNOSIS — C44329 Squamous cell carcinoma of skin of other parts of face: Secondary | ICD-10-CM | POA: Diagnosis not present

## 2017-09-17 DIAGNOSIS — C44321 Squamous cell carcinoma of skin of nose: Secondary | ICD-10-CM | POA: Diagnosis not present

## 2017-09-18 DIAGNOSIS — E669 Obesity, unspecified: Secondary | ICD-10-CM | POA: Diagnosis not present

## 2017-09-18 DIAGNOSIS — E119 Type 2 diabetes mellitus without complications: Secondary | ICD-10-CM | POA: Diagnosis not present

## 2017-09-18 DIAGNOSIS — M546 Pain in thoracic spine: Secondary | ICD-10-CM | POA: Diagnosis not present

## 2017-09-18 DIAGNOSIS — F418 Other specified anxiety disorders: Secondary | ICD-10-CM | POA: Diagnosis not present

## 2017-09-18 DIAGNOSIS — G4730 Sleep apnea, unspecified: Secondary | ICD-10-CM | POA: Diagnosis not present

## 2017-09-18 DIAGNOSIS — E7849 Other hyperlipidemia: Secondary | ICD-10-CM | POA: Diagnosis not present

## 2017-09-18 DIAGNOSIS — K219 Gastro-esophageal reflux disease without esophagitis: Secondary | ICD-10-CM | POA: Diagnosis not present

## 2017-09-18 DIAGNOSIS — Z6832 Body mass index (BMI) 32.0-32.9, adult: Secondary | ICD-10-CM | POA: Diagnosis not present

## 2017-09-18 DIAGNOSIS — N529 Male erectile dysfunction, unspecified: Secondary | ICD-10-CM | POA: Diagnosis not present

## 2017-09-18 DIAGNOSIS — I10 Essential (primary) hypertension: Secondary | ICD-10-CM | POA: Diagnosis not present

## 2017-10-02 ENCOUNTER — Telehealth: Payer: Self-pay | Admitting: Cardiovascular Disease

## 2017-10-02 NOTE — Telephone Encounter (Signed)
New onset CP . Seems worse than previous episode s of angina. The myoview done last year was not very helpful.  The Myoview showed inferior ischemia but in fact the patient had significant left anterior descending artery narrowings.  He may need a cath  We will see if we can work him in to see [employer].  Cyndia Diver has an appointment with  Nicki Reaper  He could probably  probably be rescheduled to see me on Monday morning.

## 2017-10-02 NOTE — Telephone Encounter (Signed)
Spoke with wife she states that pt is waking up in the early am with chest pain. Pt had appt with Dr Acie Fredrickson 09-01-17 and wife states that he told Dr Acie Fredrickson but she does not think that he told him how often this is happening; she states that he does not take his nitro because it is "not bad enough".  She states that the chest pain is happening intermittently  2-3 times a week and pt states that this is "scaring him". She does not know if there are any other sx(SOB, chest pressure, etc), how long, pain scale or what his BP may be since it happens in the early am. She thinks that this is only happening in the early AM and not at all during the day. She states to call pt to make sure and get further details.  lmvm for pt to call back with further sx/information.

## 2017-10-02 NOTE — Telephone Encounter (Signed)
Spoke with patient who states he has woken up with pain in the middle of his chest radiating to the right breast on several different occasions. Denies SOB, diaphoresis, or nausea with pain. He states the pain is worse when he lays flat on his back. He denies that it is positional and he cannot reproduce it with movement. Denies worsening pain with palpation of the area. He denies pain with exercise but has not exercised in 1 week due to pulled muscle in his leg. He denies pain with exercise. He states that when he was exercising last week he did notice that he would experience dizziness after walking on treadmill for 30 minutes and then riding stationary bike x 30 min. He states he has not taken any NTG. States this pain does not occur every night just on 3 different occasions. He states he does not wear CPAP. Denies irregular HR; states HR feels regular at approximately 80-85 bpm, high 90's bpm when he is exercising. He states he does not monitor BP regularly. He states they changed mattresses approximately 1 month ago, states pain started 1-2 weeks ago. He has hx CAD with 2 DES. I advised that I will forward to Dr. Acie Fredrickson for review and will call him back with his advice. He verbalized understanding and agreement and thanked me for the call.

## 2017-10-02 NOTE — Progress Notes (Signed)
Cardiology Office Note:    Date:  10/03/2017   ID:  Matthew Love, DOB 10-13-1951, MRN [id number]  PCP:  Burnard Bunting, MD  Cardiologist:  Dr. Liam Rogers    Referring MD: Burnard Bunting, MD   Chief Complaint  Patient presents with  . Chest Pain    History of Present Illness:    Matthew Love is a 66 y.o. male with a hx of coronary artery disease status post DES x2 to the LAD in October 2017, hypertension, diabetes, hyperlipidemia, sleep apnea.  Last seen by Dr. Acie Fredrickson 09/01/17.  He did complain of atypical chest pain but no symptoms reminiscent of his previous angina.  [name] returns for evaluation of chest pain.  He called in recently with worsening chest symptoms.  Over the past several days, he has awoken in the middle of the night with a right sided chest discomfort described as dull.  This only last a few seconds.  He has not taken nitroglycerin.  He has no associated symptoms.  He denies exertional symptoms.  He denies symptoms with eating or related to meals.  He denies pleuritic chest pain.  He denies discomfort with positional changes.  He denies syncope, orthopnea, PND or edema.  He denies decreased exercise tolerance.  Prior CV studies:   The following studies were reviewed today:  Cardiac catheterization 09/06/16 LAD proximal 80, distal 90 LCx ostial 20 RCA ostial 30, mid 20 PCI: 3 x 16 mm Promus Premier DES to the mid LAD; 2.25 x 20 mm Promus Premier DES to the distal LAD  Nuclear stress test 09/03/16 EF 57, inferior scar with very infarct ischemia, apical and inferolateral ischemia, high risk  Nuclear stress test 08/06/12 EF 68, no ischemia  Echo 11/11:  EF 58-52%, grade 1 diastolic dysfunction, mild LAE.   ETT-Myoview 11/11:  Inferior fixed defect likely due to diaphragmatic attenuation with normal wall motion, EF 69%.   Past Medical History:  Diagnosis Date  . Anxiety   . Arthritis   . Coronary artery disease    a. NST 08/2016: high-risk  w/ inferior and lateral wall ischemia  b. cath 09/2016: 80% mid-LAD and 90% distal LAD stenosis. Placement of 2 DES. Mild nonobstructive dz along RCA and LCx.  . Diabetes mellitus    A1C 6 weeks ago as of 09/08/14 was 6.2. Not on oral meds.  [name] GERD (gastroesophageal reflux disease)   . H/O cardiovascular stress test    ETT-Myoview 11/11: Inferior fixed defect likely due to diaphragmatic attenuation with normal wall motion, EF 69%  . H/O echocardiogram    Echo 11/11: EF 77-82%, grade 1 diastolic dysfunction, mild LAE  . H/O hiatal hernia   . Hyperlipidemia   . Hypertension   . Mini stroke ([place])   . Obesity   . Pneumonia    hx  . Sleep apnea    cpap 1 yr  . Stroke [facility]) 10    Past Surgical History:  Procedure Laterality Date  . BUNIONECTOMY    . CARDIAC CATHETERIZATION N/A 09/06/2016   Procedure: Left Heart Cath and Coronary Angiography;  Surgeon: Burnell Blanks, MD;  Location: [place] [facility];  Service: Cardiovascular;  Laterality: N/A;  . CARDIAC CATHETERIZATION N/A 09/06/2016   Procedure: Coronary Stent Intervention;  Surgeon: Burnell Blanks, MD;  Location: [place] [facility];  Service: Cardiovascular;  Laterality: N/A;  . COLONOSCOPY  2011  . CORONARY STENT PLACEMENT  09/06/2016   Severe stenosis mid LAD, s/p successful PTCA/DES  x 1  . FOOT SURGERY Left    bunion  . Hydrocell Removal    . KNEE SURGERY Left    arthroscopy  . NASAL SEPTOPLASTY W/ TURBINOPLASTY N/A 09/08/2014   Procedure: NASAL SEPTOPLASTY WITH TURBINATE REDUCTION;  Surgeon: [name], MD;  Location: [facility];  Service: ENT;  Laterality: N/A;  . TONSILLECTOMY      Current Medications: Current Meds  Medication Sig  . aspirin 81 MG tablet Take 81 mg by mouth daily.    [name] atorvastatin (LIPITOR) 40 MG tablet TAKE ONE TABLET BY MOUTH ONCE DAILY AT  6  PM  . BRILINTA 90 MG TABS tablet Take 90 mg by mouth 2 (two) times daily.   [name] EPINEPHrine (EPIPEN) 0.3 mg/0.3 mL SOAJ injection Inject 0.3 mLs  (0.3 mg total) into the muscle once. For signs or symptoms of anaphylaxis.  [name] escitalopram (LEXAPRO) 10 MG tablet Take 10 mg by mouth daily.   . irbesartan (AVAPRO) 75 MG tablet Take 1 tablet (75 mg total) by mouth daily.  . Multiple Vitamin (MULTIVITAMIN) tablet Take 1 tablet by mouth daily.    . nitroGLYCERIN (NITROSTAT) 0.4 MG SL tablet Place 0.4 mg under the tongue every 5 (five) minutes as needed for chest pain.  [name] OMEGA 3 1200 MG CAPS Take 1,200 mg by mouth daily.   . pantoprazole (PROTONIX) 40 MG tablet Take 40 mg by mouth 2 (two) times daily.  [name] triamterene-hydrochlorothiazide (DYAZIDE) 37.5-25 MG capsule Take 1 capsule by mouth daily.     Allergies:   Bee venom and Hydrocodone-acetaminophen   Social History  Substance Use Topics  . Smoking status: Former Smoker    Packs/day: 3.50    Years: 20.00    Types: Cigarettes    Quit date: 12/02/1984  . Smokeless tobacco: Never Used  . Alcohol use No     Comment: quit 64     Family Hx: The patient's family history includes Alzheimer's disease in his maternal grandmother; Colon cancer in his father; Heart attack in his mother; Heart disease in his other; Prostate cancer in his father. There is no history of Stomach cancer.  ROS:   Please see the history of present illness.    Review of Systems  Cardiovascular: Positive for chest pain.   All other systems reviewed and are negative.   EKGs/Labs/Other Test Reviewed:    EKG:  EKG is  ordered today.  The ekg ordered today demonstrates normal sinus rhythm, heart rate 65, right axis deviation, nonspecific ST-T wave changes, QTC 432 ms, similar to prior tracings  Recent Labs: 09/01/2017: ALT 24; BUN 17; Creatinine, Ser 1.12; Hemoglobin WILL FOLLOW; Platelets WILL FOLLOW; Potassium 4.0; Sodium 142   Recent Lipid Panel Lab Results  Component Value Date/Time   CHOL 108 09/01/2017 11:32 AM   TRIG 109 09/01/2017 11:32 AM   HDL 50 09/01/2017 11:32 AM   CHOLHDL 2.2 09/01/2017 11:32 AM    CHOLHDL 5.3 09/07/2016 07:09 AM   LDLCALC 36 09/01/2017 11:32 AM    Physical Exam:    VS:  BP 120/60   Pulse 65   Ht 6\' 2"  (1.88 m)   Wt 247 lb (112 kg)   SpO2 98%   BMI 31.71 kg/m     Wt Readings from Last 3 Encounters:  10/03/17 247 lb (112 kg)  09/01/17 243 lb 6.4 oz (110.4 kg)  01/27/17 239 lb 6.4 oz (108.6 kg)     Physical Exam  Constitutional: He is oriented to person, place, and  time. He appears well-developed and well-nourished. No distress.  HENT:  Head: Normocephalic and atraumatic.  Neck: No JVD present.  Cardiovascular: Normal rate and regular rhythm.   No murmur heard. Pulmonary/Chest: Effort normal. He has no rales. He exhibits no tenderness.  Abdominal: Soft. There is no tenderness.  Musculoskeletal: He exhibits no edema.  Neurological: He is alert and oriented to person, place, and time.  Skin: Skin is warm and dry.    ASSESSMENT:    1. Precordial pain   2. Coronary artery disease involving native coronary artery of native heart without angina pectoris   3. Essential hypertension   4. Mixed hyperlipidemia    PLAN:    In order of problems listed above:  1. Precordial pain He presents with chest discomfort of the past several days.  It seems to wake him up in the middle of the night.  He has no associated symptoms.  His symptoms quickly abate shortly after awakening.  He denies any symptoms that sound to be associated with a gastrointestinal etiology or chest wall symptoms.  I reviewed his symptoms with Dr. Acie Fredrickson by phone today.  We questioned whether or not coronary CTA with FFR would be a possibility given the difficulty in reading his Myoview studies in the past.  However, with his previous stenting, the images will not be useful.  Therefore, I have recommended proceeding with an exercise Myoview to further evaluate his symptoms.  He does not take PDE-5 inhibitors.  I will place him on isosorbide 15 mg daily.  He will return for close follow-up in the  next 2 weeks.  -ETT-Myoview  -Isosorbide 15 mg daily   2.  Coronary artery disease involving native coronary artery of native heart without angina pectoris History of stenting to the LAD in 2017.  Proceed with stress testing and add nitrates as outlined above.  Continue aspirin, Brilinta, statin.  3.  Essential hypertension The patient's blood pressure is controlled on his current regimen.  Continue current therapy.   4.  Mixed hyperlipidemia Continue statin.  Dispo:  Return in about 2 weeks (around 10/17/2017) for Close Follow Up, w/ Dr. Acie Fredrickson, or [name], PA-C.   Medication Adjustments/Labs and Tests Ordered: Current medicines are reviewed at length with the patient today.  Concerns regarding medicines are outlined above.  Tests Ordered: Orders Placed This Encounter  Procedures  . Myocardial Perfusion Imaging  . EKG 12-Lead   Medication Changes: Meds ordered this encounter  Medications  . isosorbide mononitrate (IMDUR) 30 MG 24 hr tablet    Sig: Take 0.5 tablets (15 mg total) by mouth daily.    Dispense:  15 tablet    Refill:  11    Order Specific Question:   Supervising Provider    Answer:   Lauree Chandler D [[id number]]    Signed, [name], PA-C  10/03/2017 9:01 AM    [facility] [place], [place], [place]  [address] Phone: [phone]; Fax: [phone]

## 2017-10-02 NOTE — Telephone Encounter (Signed)
Follow up ° ° ° °Patient returning call.  Please call °

## 2017-10-02 NOTE — Telephone Encounter (Signed)
Pt c/o of Chest Pain: 1. Are you having CP right now? no 2. Are you experiencing any other symptoms (ex. SOB, nausea, vomiting, sweating)?no  3. How long have you been experiencing CP? About a month   4. Is your CP continuous or coming and going? Coming and going -mainly at night while sleeping , will wake him up out of his sleep  5. Have you taken Nitroglycerin? no

## 2017-10-02 NOTE — Telephone Encounter (Signed)
Scheduled patient to see [name], PA tomorrow, 11/2. Patient verbalized understanding and agreement with plan of care and thanked me for the call.

## 2017-10-03 ENCOUNTER — Ambulatory Visit (INDEPENDENT_AMBULATORY_CARE_PROVIDER_SITE_OTHER): Payer: Medicare Other | Admitting: Physician Assistant

## 2017-10-03 ENCOUNTER — Encounter: Payer: Self-pay | Admitting: Physician Assistant

## 2017-10-03 VITALS — BP 120/60 | HR 65 | Ht 74.0 in | Wt 247.0 lb

## 2017-10-03 DIAGNOSIS — I10 Essential (primary) hypertension: Secondary | ICD-10-CM | POA: Diagnosis not present

## 2017-10-03 DIAGNOSIS — E782 Mixed hyperlipidemia: Secondary | ICD-10-CM

## 2017-10-03 DIAGNOSIS — I2510 Atherosclerotic heart disease of native coronary artery without angina pectoris: Secondary | ICD-10-CM

## 2017-10-03 DIAGNOSIS — R072 Precordial pain: Secondary | ICD-10-CM | POA: Diagnosis not present

## 2017-10-03 DIAGNOSIS — I25110 Atherosclerotic heart disease of native coronary artery with unstable angina pectoris: Secondary | ICD-10-CM

## 2017-10-03 MED ORDER — ISOSORBIDE MONONITRATE ER 30 MG PO TB24: 15.0000 mg | ORAL_TABLET | Freq: Every day | ORAL | 11 refills | Status: DC

## 2017-10-03 NOTE — Patient Instructions (Signed)
Medication Instructions:  1. START IMDUR 30 MG TABLET WITH THE DIRECTIONS TO TAKE 1/2 TABLET DAILY = 15 MG DAILY; RX HAS BEEN SENT IN  Labwork: NONE ORDERED TODAY  Testing/Procedures: 1. Your physician has requested that you have en exercise stress myoview. For further information please visit [email or web address]. Please follow instruction sheet, as given.    Follow-Up: SCOTT WEAVER, PAC 2 WEEKS SAME DAY DR. Acie Fredrickson IS IN THE OFFICE  Any Other Special Instructions Will Be Listed Below (If Applicable).     If you need a refill on your cardiac medications before your next appointment, please call your pharmacy.

## 2017-10-06 ENCOUNTER — Telehealth (HOSPITAL_COMMUNITY): Payer: Self-pay | Admitting: *Deleted

## 2017-10-06 NOTE — Telephone Encounter (Signed)
Patient given detailed instructions per Myocardial Perfusion Study Information Sheet for the test on 10/07/17. Patient notified to arrive 15 minutes early and that it is imperative to arrive on time for appointment to keep from having the test rescheduled.  If you need to cancel or reschedule your appointment, please call the office within 24 hours of your appointment. . Patient verbalized understanding. Kirstie Peri

## 2017-10-07 ENCOUNTER — Ambulatory Visit (HOSPITAL_COMMUNITY): Payer: Medicare Other | Attending: Cardiovascular Disease

## 2017-10-07 DIAGNOSIS — R072 Precordial pain: Secondary | ICD-10-CM

## 2017-10-07 LAB — MYOCARDIAL PERFUSION IMAGING
CHL RATE OF PERCEIVED EXERTION: 18
Estimated workload: 8.8 METS
Exercise duration (min): 7 min
Exercise duration (sec): 15 s
LHR: 0.32
LV dias vol: 97 mL (ref 62–150)
LVSYSVOL: 31 mL
MPHR: 154 {beats}/min
NUC STRESS TID: 0.86
Peak HR: 107 {beats}/min
Percent HR: 69 %
Rest HR: 56 {beats}/min
SDS: 3
SRS: 6
SSS: 9

## 2017-10-07 MED ORDER — REGADENOSON 0.4 MG/5ML IV SOLN
0.4000 mg | Freq: Once | INTRAVENOUS | Status: AC
  Administered 2017-10-07: 0.4 mg via INTRAVENOUS

## 2017-10-07 MED ORDER — TECHNETIUM TC 99M TETROFOSMIN IV KIT
10.1000 | PACK | Freq: Once | INTRAVENOUS | Status: AC | PRN
  Administered 2017-10-07: 10.1 via INTRAVENOUS
  Filled 2017-10-07: qty 11

## 2017-10-07 MED ORDER — TECHNETIUM TC 99M TETROFOSMIN IV KIT
32.4000 | PACK | Freq: Once | INTRAVENOUS | Status: AC | PRN
  Administered 2017-10-07: 32.4 via INTRAVENOUS
  Filled 2017-10-07: qty 33

## 2017-10-07 NOTE — Progress Notes (Signed)
Cardiology Office Note:    Date:  10/08/2017   ID:  Matthew Love, DOB [birth year], MRN [id number]  PCP:  Burnard Bunting, MD  Cardiologist:  Dr. Liam Rogers    Referring MD: Burnard Bunting, MD   Chief Complaint  Patient presents with  . Follow-up    Chest pain    History of Present Illness:    Matthew Love is a 66 y.o. male with a hx of coronary artery disease status post DES x2 to the LAD in October 2017, hypertension, diabetes, hyperlipidemia, sleep apnea. I saw him last week with complaints of chest pain.  I stared him on nitrates and obtained a Nuclear stress test.    Matthew Love returns for follow up.  He is here alone.  Since last seen, he has not had any more chest pain at night.  He denies shortness of breath, dizziness, edema.  He has not had any side effects with the Isosorbide.   Prior CV studies:   The following studies were reviewed today:  Nuclear Stress Test 10/07/17 EF 68, inf defect (attenuation vs prior infarction), no ischemia, Low Risk  Cardiac catheterization 09/06/16 LAD proximal 80, distal 90 LCx ostial 20 RCA ostial 30, mid 20 PCI: 3 x 16 mm Promus Premier DES to the mid LAD; 2.25 x 20 mm Promus Premier DES to the distal LAD  Nuclear stress test 09/03/16 EF 57, inferior scar with peri-infarct ischemia, apical and inferolateral ischemia, high risk  Nuclear stress test 08/06/12 EF 68, no ischemia  Echo 11/11:  EF 29-93%, grade 1 diastolic dysfunction, mild LAE.   ETT-Myoview 11/11:  Inferior fixed defect likely due to diaphragmatic attenuation with normal wall motion, EF 69%.   Past Medical History:  Diagnosis Date  . Anxiety   . Arthritis   . Coronary artery disease    a. NST 08/2016: high-risk w/ inferior and lateral wall ischemia  b. cath 09/2016: 80% mid-LAD and 90% distal LAD stenosis. Placement of 2 DES. Mild nonobstructive dz along RCA and LCx.  . Diabetes mellitus    A1C 6 weeks ago as of 09/08/14 was 6.2. Not on oral  meds.  [name] GERD (gastroesophageal reflux disease)   . H/O cardiovascular stress test    ETT-Myoview 11/11: Inferior fixed defect likely due to diaphragmatic attenuation with normal wall motion, EF 69%  . H/O echocardiogram    Echo 11/11: EF 71-69%, grade 1 diastolic dysfunction, mild LAE  . H/O hiatal hernia   . Hyperlipidemia   . Hypertension   . Mini stroke ([place])   . Obesity   . Pneumonia    hx  . Sleep apnea    cpap 1 yr  . Stroke [facility]) 10    Past Surgical History:  Procedure Laterality Date  . BUNIONECTOMY    . COLONOSCOPY  2011  . CORONARY STENT PLACEMENT  09/06/2016   Severe stenosis mid LAD, s/p successful PTCA/DES x 1  . FOOT SURGERY Left    bunion  . Hydrocell Removal    . KNEE SURGERY Left    arthroscopy  . TONSILLECTOMY      Current Medications: Current Meds  Medication Sig  . aspirin 81 MG tablet Take 81 mg by mouth daily.    [name] atorvastatin (LIPITOR) 40 MG tablet TAKE ONE TABLET BY MOUTH ONCE DAILY AT  6  PM  . BRILINTA 90 MG TABS tablet Take 90 mg by mouth 2 (two) times daily.   [name] EPINEPHrine (EPIPEN) 0.3 mg/0.3 mL SOAJ  injection Inject 0.3 mLs (0.3 mg total) into the muscle once. For signs or symptoms of anaphylaxis.  [name] escitalopram (LEXAPRO) 10 MG tablet Take 10 mg by mouth daily.   . irbesartan (AVAPRO) 75 MG tablet Take 1 tablet (75 mg total) by mouth daily.  . isosorbide mononitrate (IMDUR) 30 MG 24 hr tablet Take 0.5 tablets (15 mg total) by mouth daily.  . Multiple Vitamin (MULTIVITAMIN) tablet Take 1 tablet by mouth daily.    . nitroGLYCERIN (NITROSTAT) 0.4 MG SL tablet Place 0.4 mg under the tongue every 5 (five) minutes as needed for chest pain.  [name] OMEGA 3 1200 MG CAPS Take 1,200 mg by mouth daily.   . pantoprazole (PROTONIX) 40 MG tablet Take 40 mg by mouth 2 (two) times daily.  [name] triamterene-hydrochlorothiazide (DYAZIDE) 37.5-25 MG capsule Take 1 capsule by mouth daily.     Allergies:   Bee venom and Hydrocodone-acetaminophen   Social History     Tobacco Use  . Smoking status: Former Smoker    Packs/day: 3.50    Years: 20.00    Pack years: 70.00    Types: Cigarettes    Last attempt to quit: 12/02/1984    Years since quitting: 32.8  . Smokeless tobacco: Never Used  Substance Use Topics  . Alcohol use: No    Comment: quit 85  . Drug use: No     Family Hx: The patient's family history includes Alzheimer's disease in his maternal grandmother; Colon cancer in his father; Heart attack in his mother; Heart disease in his other; Prostate cancer in his father. There is no history of Stomach cancer.  ROS:   Please see the history of present illness.    ROS All other systems reviewed and are negative.   EKGs/Labs/Other Test Reviewed:    EKG:  EKG is not ordered today.    Recent Labs: 09/01/2017: ALT 24; BUN 17; Creatinine, Ser 1.12; Hemoglobin WILL FOLLOW; Platelets WILL FOLLOW; Potassium 4.0; Sodium 142   Recent Lipid Panel Lab Results  Component Value Date/Time   CHOL 108 09/01/2017 11:32 AM   TRIG 109 09/01/2017 11:32 AM   HDL 50 09/01/2017 11:32 AM   CHOLHDL 2.2 09/01/2017 11:32 AM   CHOLHDL 5.3 09/07/2016 07:09 AM   LDLCALC 36 09/01/2017 11:32 AM    Physical Exam:    VS:  BP (!) 108/50   Pulse 66   Ht 6\' 2"  (1.88 m)   Wt 247 lb 12.8 oz (112.4 kg)   SpO2 97%   BMI 31.82 kg/m     Wt Readings from Last 3 Encounters:  10/08/17 247 lb 12.8 oz (112.4 kg)  10/07/17 247 lb (112 kg)  10/03/17 247 lb (112 kg)     Physical Exam  Constitutional: He is oriented to person, place, and time. He appears well-developed and well-nourished. No distress.  HENT:  Head: Normocephalic and atraumatic.  Neck: No JVD present.  Cardiovascular: Normal rate, regular rhythm and normal heart sounds.  No murmur heard. Pulmonary/Chest: Effort normal. He has no rales.  Abdominal: Soft.  Musculoskeletal: He exhibits no edema.  Neurological: He is alert and oriented to person, place, and time.  Skin: Skin is warm and dry.     ASSESSMENT:    1. Coronary artery disease involving native coronary artery of native heart without angina pectoris   2. Essential hypertension    PLAN:    In order of problems listed above:  1. Coronary artery disease involving native coronary artery of native heart  without angina pectoris History of DES to the LAD in 2017.  He was recently seen for chest discomfort that was mainly waking him up in the middle of the night.  He is doing better on low-dose isosorbide.  Recent nuclear stress test demonstrates no ischemia.  With improved symptoms and a low risk nuclear stress test, he does not require further testing.  Will continue medical therapy.  Continue aspirin, Brilinta, statin, isosorbide.  2.  Essential hypertension The patient's blood pressure is controlled on his current regimen.  Continue current therapy.    Dispo:  Return in about 2 months (around 12/08/2017) for w/ Dr. Acie Fredrickson.   Medication Adjustments/Labs and Tests Ordered: Current medicines are reviewed at length with the patient today.  Concerns regarding medicines are outlined above.  Tests Ordered: No orders of the defined types were placed in this encounter.  Medication Changes: No orders of the defined types were placed in this encounter.   Signed, [name], PA-C  10/08/2017 9:04 AM    [facility] [place], [place], [place]  [address] Phone: [phone]; Fax: [phone]

## 2017-10-08 ENCOUNTER — Encounter: Payer: Self-pay | Admitting: Physician Assistant

## 2017-10-08 ENCOUNTER — Ambulatory Visit (INDEPENDENT_AMBULATORY_CARE_PROVIDER_SITE_OTHER): Payer: Medicare Other | Admitting: Physician Assistant

## 2017-10-08 VITALS — BP 108/50 | HR 66 | Ht 74.0 in | Wt 247.8 lb

## 2017-10-08 DIAGNOSIS — I2510 Atherosclerotic heart disease of native coronary artery without angina pectoris: Secondary | ICD-10-CM

## 2017-10-08 DIAGNOSIS — I10 Essential (primary) hypertension: Secondary | ICD-10-CM | POA: Diagnosis not present

## 2017-10-08 DIAGNOSIS — I25110 Atherosclerotic heart disease of native coronary artery with unstable angina pectoris: Secondary | ICD-10-CM

## 2017-10-08 NOTE — Patient Instructions (Signed)
Your physician recommends that you continue on your current medications as directed. Please refer to the Current Medication list given to you today.   Your physician recommends that you schedule a follow-up appointment in:  Matthew Love

## 2017-10-13 MED ORDER — NITROGLYCERIN 0.4 MG SL SUBL
SUBLINGUAL_TABLET | SUBLINGUAL | Status: AC
  Filled 2017-10-13: qty 1

## 2017-11-14 ENCOUNTER — Telehealth: Payer: Self-pay | Admitting: Cardiovascular Disease

## 2017-11-14 MED ORDER — CLOPIDOGREL BISULFATE 75 MG PO TABS: 75.0000 mg | ORAL_TABLET | Freq: Every day | ORAL | 3 refills | Status: DC

## 2017-11-14 NOTE — Telephone Encounter (Signed)
Spoke with patients wife who states the price of Brilinta increased significantly with his most recent Rx. I advised that I will ask Dr. Acie Fredrickson about patient switching to plavix since his stent was > 1 year ago. I also asked our patient care advocates and they advised that the Brilinta coupon that patient uses previously to reduce cost is only permitted one time per duration of patient's therapy. I advised patient's wife that I will forward to Dr. Acie Fredrickson and will call her back with his advice. She thanked me for the call.

## 2017-11-14 NOTE — Telephone Encounter (Signed)
Agree with note by Christen Bame, RN Its been over a year since PCI, OK to switch to Plavix 75 mg a day  He does not need to take a loading dose

## 2017-11-14 NOTE — Telephone Encounter (Signed)
New Message  Pt c/o medication issue:  1. Name of Medication: Brilinta   2. How are you currently taking this medication (dosage and times per day)? 90  3. Are you having a reaction (difficulty breathing--STAT)? No  4. What is your medication issue? Per pt wife medication to expensive, would like to discuss a cheaper alternate.

## 2017-11-26 DIAGNOSIS — G459 Transient cerebral ischemic attack, unspecified: Secondary | ICD-10-CM | POA: Insufficient documentation

## 2017-11-26 DIAGNOSIS — M1990 Unspecified osteoarthritis, unspecified site: Secondary | ICD-10-CM | POA: Insufficient documentation

## 2017-11-26 DIAGNOSIS — J189 Pneumonia, unspecified organism: Secondary | ICD-10-CM | POA: Insufficient documentation

## 2017-11-26 DIAGNOSIS — Z9289 Personal history of other medical treatment: Secondary | ICD-10-CM | POA: Insufficient documentation

## 2017-11-26 DIAGNOSIS — F419 Anxiety disorder, unspecified: Secondary | ICD-10-CM | POA: Insufficient documentation

## 2017-11-26 DIAGNOSIS — I639 Cerebral infarction, unspecified: Secondary | ICD-10-CM | POA: Insufficient documentation

## 2017-11-26 DIAGNOSIS — Z8719 Personal history of other diseases of the digestive system: Secondary | ICD-10-CM | POA: Insufficient documentation

## 2017-12-14 DIAGNOSIS — I2510 Atherosclerotic heart disease of native coronary artery without angina pectoris: Secondary | ICD-10-CM | POA: Diagnosis not present

## 2017-12-14 DIAGNOSIS — Z87891 Personal history of nicotine dependence: Secondary | ICD-10-CM | POA: Diagnosis not present

## 2017-12-14 DIAGNOSIS — Z8673 Personal history of transient ischemic attack (TIA), and cerebral infarction without residual deficits: Secondary | ICD-10-CM | POA: Diagnosis not present

## 2017-12-14 DIAGNOSIS — Z955 Presence of coronary angioplasty implant and graft: Secondary | ICD-10-CM | POA: Diagnosis not present

## 2017-12-14 DIAGNOSIS — T7840XA Allergy, unspecified, initial encounter: Secondary | ICD-10-CM | POA: Diagnosis not present

## 2017-12-14 DIAGNOSIS — L508 Other urticaria: Secondary | ICD-10-CM | POA: Diagnosis not present

## 2017-12-14 DIAGNOSIS — I10 Essential (primary) hypertension: Secondary | ICD-10-CM | POA: Diagnosis not present

## 2017-12-15 ENCOUNTER — Ambulatory Visit: Payer: Medicare Other | Admitting: Cardiovascular Disease

## 2018-01-09 ENCOUNTER — Ambulatory Visit (INDEPENDENT_AMBULATORY_CARE_PROVIDER_SITE_OTHER): Payer: Medicare Other | Admitting: Cardiovascular Disease

## 2018-01-09 ENCOUNTER — Encounter: Payer: Self-pay | Admitting: Cardiovascular Disease

## 2018-01-09 VITALS — BP 118/74 | HR 63 | Ht 74.0 in | Wt 254.0 lb

## 2018-01-09 DIAGNOSIS — I2510 Atherosclerotic heart disease of native coronary artery without angina pectoris: Secondary | ICD-10-CM | POA: Diagnosis not present

## 2018-01-09 DIAGNOSIS — E782 Mixed hyperlipidemia: Secondary | ICD-10-CM

## 2018-01-09 NOTE — Progress Notes (Signed)
Matthew Love, [place]  [address] Phone: [phone] Fax:  [phone]  Date:  01/09/2018   Name:  Matthew Love   DOB:  1951-03-28   MRN:  [id number]  PCP:  Burnard Bunting, MD  Primary Cardiologist:  Dr. Liam Rogers  Primary Electrophysiologist:  None    Notes prior to 2013:  Matthew Love is a 67 y.o. male who returns for evaluation of chest pain.  He has a history of HTN, DM2, HL, GERD and sleep apnea. He is noncompliant with CPAP.   Echo 11/11: EF 01-60%, grade 1 diastolic dysfunction, mild LAE.  ETT-Myoview 11/11: Inferior fixed defect likely due to diaphragmatic attenuation with normal wall motion, EF 69%.   Over the last 2-3 months, he has noted worsening left-sided chest pain. He can point to it with one finger. It feels dull. He denies any exertional chest pain. He notes it only at rest. He denies any radiating symptoms or associated nausea, diaphoresis. He denies associated shortness of breath. He does have dyspnea with more extreme activities. He denies orthopnea, PND or significant pedal edema. He has had a cough over last several months. This is productive of clear sputum. He was prescribed a Z-Pak at one point with his primary care provider. He denies any recent travels, injuries to his lower extremities or recent hospitalizations.  He denies pleuritic chest pain or chest pain with lying supine.  Sept. 17, 2013 -   He was seen by Matthew Love in August for chest pain. A stress Myoview study was normal. He increase his Nexium and the mild chest discomfort improved.  Sept. 19, 2016:  Doing well  . Retired from [employer]   Exercising  Some .   Walking 30 minutes every AM.   His foot and toes on left side get very cold   Sept. 21, 2017:  Matthew Love is seen today for a work in visit Had some chest pain  Center of chest.    Seemed to be worse when he put his hand on his chest  Awoke last night with some left sided chest pain   Usually occurs when he is lying down. Walking 20 minutes a day -  Has gained about 10 lbs over the past month    Nov. 20, 2017:    Matthew Love is seen back .  He had stenting of his proximal/mid LAD and stenting of his distal  in October, 2017. [name] Since that time he's had some lightheadedness / dizziness. Had a GI bug this past weekend.   Had some diarrhea and poor appetite.   He has improved his diet. Has lost weight - 25-30 lbs since his the stent procedure .  Is avoiding salt.    Feb. 26, 2018:  Has lost another 40 lbs. Is watching his diet  No CP .   Oct. 1, 2018:    Doing well.   No angina ,  Has MSK aches ,  No pain similar to her previous angina   , no dyspnea.  Not exercising as much.  Having some back issues.  January 09, 2018:  Matthew Love is doing well  No CP  Has not been sleeping well Has been having some chest pain  Has lots of dizziness.   Wife Matthew Love thinks it might be due to Imdur  Wants to start walking more  Retired from [employer] from Last 3 Encounters:  01/09/18 254 lb (115.2 kg)  10/08/17 247 lb 12.8 oz (112.4 kg)  10/07/17 247 lb (112 kg)     Past Medical History:  Diagnosis Date  . Anxiety   . Arthritis   . Coronary artery disease    a. NST 08/2016: high-risk w/ inferior and lateral wall ischemia  b. cath 09/2016: 80% mid-LAD and 90% distal LAD stenosis. Placement of 2 DES. Mild nonobstructive dz along RCA and LCx.  . Diabetes mellitus    A1C 6 weeks ago as of 09/08/14 was 6.2. Not on oral meds.  [name] GERD (gastroesophageal reflux disease)   . H/O cardiovascular stress test    ETT-Myoview 11/11: Inferior fixed defect likely due to diaphragmatic attenuation with normal wall motion, EF 69%  . H/O echocardiogram    Echo 11/11: EF 63-14%, grade 1 diastolic dysfunction, mild LAE  . H/O hiatal hernia   . Hyperlipidemia   . Hypertension   . Mini stroke (Matthew Love)   . Obesity   . Pneumonia    hx  . Sleep apnea    cpap 1 yr  . Stroke [facility]) 10     Current Outpatient Medications  Medication Sig Dispense Refill  . aspirin 81 MG tablet Take 81 mg by mouth daily.      [name] atorvastatin (LIPITOR) 40 MG tablet TAKE ONE TABLET BY MOUTH ONCE DAILY AT  6  PM 30 tablet 6  . clopidogrel (PLAVIX) 75 MG tablet Take 1 tablet (75 mg total) by mouth daily. 90 tablet 3  . EPINEPHrine (EPIPEN) 0.3 mg/0.3 mL SOAJ injection Inject 0.3 mLs (0.3 mg total) into the muscle once. For signs or symptoms of anaphylaxis. 1 Device 0  . escitalopram (LEXAPRO) 10 MG tablet Take 10 mg by mouth daily.     . irbesartan (AVAPRO) 75 MG tablet Take 1 tablet (75 mg total) by mouth daily. 30 tablet 11  . isosorbide mononitrate (IMDUR) 30 MG 24 hr tablet Take 0.5 tablets (15 mg total) by mouth daily. 15 tablet 11  . Multiple Vitamin (MULTIVITAMIN) tablet Take 1 tablet by mouth daily.      . nitroGLYCERIN (NITROSTAT) 0.4 MG SL tablet Place 0.4 mg under the tongue every 5 (five) minutes as needed for chest pain.    [name] OMEGA 3 1200 MG CAPS Take 1,200 mg by mouth daily.     . pantoprazole (PROTONIX) 40 MG tablet Take 40 mg by mouth 2 (two) times daily.    [name] triamterene-hydrochlorothiazide (DYAZIDE) 37.5-25 MG capsule Take 1 capsule by mouth daily.     No current facility-administered medications for this visit.     Allergies: Allergies  Allergen Reactions  . Bee Venom Anaphylaxis, Itching and Swelling  . Hydrocodone-Acetaminophen Rash    Social History   Tobacco Use  . Smoking status: Former Smoker    Packs/day: 3.50    Years: 20.00    Pack years: 70.00    Types: Cigarettes    Last attempt to quit: 12/02/1984    Years since quitting: 33.1  . Smokeless tobacco: Never Used  Substance Use Topics  . Alcohol use: No    Comment: quit 85  . Drug use: No     ROS:  Please see the history of present illness.     All other systems reviewed and negative.    Physical Exam: Blood pressure 118/74, pulse 63, height 6\' 2"  (1.88 m), weight 254 lb (115.2 kg), SpO2 97  %.  GEN:  Well nourished, well developed in no acute distress HEENT: Normal NECK: No JVD;  No carotid bruits LYMPHATICS: No lymphadenopathy CARDIAC: RR RESPIRATORY:  Clear to auscultation without rales, wheezing or rhonchi  ABDOMEN:  Moderate obesity  MUSCULOSKELETAL:  No edema; No deformity  SKIN: Warm and dry NEUROLOGIC:  Alert and oriented x 3   EKG:          ASSESSMENT AND PLAN:  1.    CAD :    Has some occasional , atypical CP ,  Symptoms seem to more related to dizziness. Will DC Imdur and see how he does  2.   Hypertension:     bP is well controlled.  May be a little on the low side.  He is having symptoms of dizziness since starting the isosorbide.  We will stop the isosorbide and see how he does.    4.  Hyperlipidemia: -    Will check fasting labs at his next visit in 3 months   5. Sleep apnea.  Will see him again in  3 months      Mertie Moores, MD  01/09/2018 4:30 PM    [address],  [place], [place]  [address] Pager [phone] Phone: [phone]; Fax: [phone]

## 2018-01-09 NOTE — Patient Instructions (Addendum)
Medication Instructions:  Your physician has recommended you make the following change in your medication:  STOP Isosorbide Mononitrate (Imdur)   Labwork: Your physician recommends that you return for lab work in: 3 months on the day of or a few days before your office visit with Dr. Acie Fredrickson.  You will need to FAST for this appointment - nothing to eat or drink after midnight the night before except water.   Testing/Procedures: None Ordered   Follow-Up: Your physician recommends that you schedule a follow-up appointment in: 3 months with Dr. Acie Fredrickson   If you need a refill on your cardiac medications before your next appointment, please call your pharmacy.   Thank you for choosing [facility]! Christen Bame, RN [phone]

## 2018-01-12 ENCOUNTER — Ambulatory Visit (INDEPENDENT_AMBULATORY_CARE_PROVIDER_SITE_OTHER): Payer: Medicare Other | Admitting: Allergy and Immunology

## 2018-01-12 ENCOUNTER — Encounter: Payer: Self-pay | Admitting: Allergy and Immunology

## 2018-01-12 VITALS — BP 102/70 | HR 64 | Temp 98.0°F | Resp 20 | Ht 73.0 in | Wt 258.2 lb

## 2018-01-12 DIAGNOSIS — T63481A Toxic effect of venom of other arthropod, accidental (unintentional), initial encounter: Secondary | ICD-10-CM

## 2018-01-12 DIAGNOSIS — T7809XS Anaphylactic reaction due to other food products, sequela: Secondary | ICD-10-CM | POA: Diagnosis not present

## 2018-01-12 DIAGNOSIS — T782XXA Anaphylactic shock, unspecified, initial encounter: Secondary | ICD-10-CM | POA: Diagnosis not present

## 2018-01-12 DIAGNOSIS — T63441S Toxic effect of venom of bees, accidental (unintentional), sequela: Secondary | ICD-10-CM | POA: Diagnosis not present

## 2018-01-12 DIAGNOSIS — I2510 Atherosclerotic heart disease of native coronary artery without angina pectoris: Secondary | ICD-10-CM | POA: Diagnosis not present

## 2018-01-12 MED ORDER — EPINEPHRINE 0.3 MG/0.3ML IJ SOAJ: 0.3000 mg | Freq: Once | INTRAMUSCULAR | 3 refills | Status: AC

## 2018-01-12 NOTE — Progress Notes (Signed)
NEW PATIENT NOTE  Referring Provider: No ref. provider found Primary Provider: Burnard Bunting, MD Date of office visit: 01/12/2018    Subjective:   Chief Complaint:  Matthew Love (DOB: November 21, 1951) is a 67 y.o. male who presents to the clinic on 01/12/2018 with a chief complaint of Allergic Reaction; Angioedema; Urticaria; and Dysphagia .     HPI: Matthew Love presents to this clinic in evaluation of 2 issues.  First, he has had 2 anaphylactic reactions one about 5 years ago and one that occurred on December 14, 2017.  Both of these reactions were similar.  His January reaction started with an itchy abdomen and redness on his abdomen quickly followed by global urticaria and facial swelling and trouble swallowing.  He went to the emergency room where he developed burping and dizziness and vomited.  He believes that his burping and dizziness and vomiting was secondary to the administration of a medicine in the emergency room.  He was treated with multiple medications and it took approximately 12 hours for his entire reaction to resolve.  Concerning possible triggers for this reaction, he did eat a burger at around 4 PM that afternoon and his reaction started at 9 PM.  He usually eats mammal about twice a year.  However, since that reaction he has eaten a burger without any problem.  Second, he has a history about 5 years ago of being stung by yellow jacket on his leg and neck and he developed facial swelling and throat swelling requiring the administration of what sounds like 2 injections of epinephrine at a [facility].  He has not been stung since.  He was given an EpiPen with that episode but his EpiPen is expired.  Past Medical History:  Diagnosis Date  . Anxiety   . Arthritis   . Coronary artery disease    a. NST 08/2016: high-risk w/ inferior and lateral wall ischemia  b. cath 09/2016: 80% mid-LAD and 90% distal LAD stenosis. Placement of 2 DES. Mild nonobstructive dz along RCA and LCx.    . Diabetes mellitus    A1C 6 weeks ago as of 09/08/14 was 6.2. Not on oral meds.  [name] GERD (gastroesophageal reflux disease)   . H/O cardiovascular stress test    ETT-Myoview 11/11: Inferior fixed defect likely due to diaphragmatic attenuation with normal wall motion, EF 69%  . H/O echocardiogram    Echo 11/11: EF 56-43%, grade 1 diastolic dysfunction, mild LAE  . H/O hiatal hernia   . Hyperlipidemia   . Hypertension   . Mini stroke ([place])   . Obesity   . Pneumonia    hx  . Sleep apnea    cpap 1 yr  . Stroke [facility]) 10    Past Surgical History:  Procedure Laterality Date  . BUNIONECTOMY    . CARDIAC CATHETERIZATION N/A 09/06/2016   Procedure: Left Heart Cath and Coronary Angiography;  Surgeon: Burnell Blanks, MD;  Location: [place] [facility];  Service: Cardiovascular;  Laterality: N/A;  . CARDIAC CATHETERIZATION N/A 09/06/2016   Procedure: Coronary Stent Intervention;  Surgeon: Burnell Blanks, MD;  Location: [place] [facility];  Service: Cardiovascular;  Laterality: N/A;  . COLONOSCOPY  2011  . CORONARY STENT PLACEMENT  09/06/2016   Severe stenosis mid LAD, s/p successful PTCA/DES x 1  . FOOT SURGERY Left    bunion  . Hydrocell Removal    . KNEE SURGERY Left    arthroscopy  . NASAL SEPTOPLASTY W/ TURBINOPLASTY N/A  09/08/2014   Procedure: NASAL SEPTOPLASTY WITH TURBINATE REDUCTION;  Surgeon: [name], MD;  Location: [place];  Service: ENT;  Laterality: N/A;  . TONSILLECTOMY      Allergies as of 01/12/2018      Reactions   Bee Venom Anaphylaxis, Itching, Swelling   Hydrocodone-acetaminophen Rash      Medication List      aspirin 81 MG tablet Take 81 mg by mouth daily.   atorvastatin 40 MG tablet Commonly known as:  LIPITOR TAKE ONE TABLET BY MOUTH ONCE DAILY AT  6  PM   clopidogrel 75 MG tablet Commonly known as:  PLAVIX Take 1 tablet (75 mg total) by mouth daily.   EPINEPHrine 0.3 mg/0.3 mL Soaj injection Commonly known as:  EPIPEN Inject 0.3  mLs (0.3 mg total) into the muscle once. For signs or symptoms of anaphylaxis.   escitalopram 10 MG tablet Commonly known as:  LEXAPRO Take 10 mg by mouth daily.   irbesartan 75 MG tablet Commonly known as:  AVAPRO Take 1 tablet (75 mg total) by mouth daily.   multivitamin tablet Take 1 tablet by mouth daily.   nitroGLYCERIN 0.4 MG SL tablet Commonly known as:  NITROSTAT Place 0.4 mg under the tongue every 5 (five) minutes as needed for chest pain.   Omega 3 1200 MG Caps Take 1,200 mg by mouth daily.   pantoprazole 40 MG tablet Commonly known as:  PROTONIX Take 40 mg by mouth 2 (two) times daily.   triamterene-hydrochlorothiazide 37.5-25 MG capsule Commonly known as:  DYAZIDE Take 1 capsule by mouth daily.       Review of systems negative except as noted in HPI / PMHx or noted below:  Review of Systems  Constitutional: Negative.   HENT: Negative.   Eyes: Negative.   Respiratory: Negative.   Cardiovascular: Negative.   Gastrointestinal: Negative.   Genitourinary: Negative.   Musculoskeletal: Negative.   Skin: Negative.   Neurological: Negative.   Endo/Heme/Allergies: Negative.   Psychiatric/Behavioral: Negative.     Family History  Problem Relation Age of Onset  . Prostate cancer Father   . Colon cancer Father   . Heart attack Mother   . Heart disease Other        paternal grandparents  . Alzheimer's disease Maternal Grandmother   . Stomach cancer Neg Hx     Social History   Socioeconomic History  . Marital status: Married    Spouse name: Not on file  . Number of children: Not on file  . Years of education: Not on file  . Highest education level: Not on file  Social Needs  . Financial resource strain: Not on file  . Food insecurity - worry: Not on file  . Food insecurity - inability: Not on file  . Transportation needs - medical: Not on file  . Transportation needs - non-medical: Not on file  Occupational History  . Occupation: Drivers Ed  Product manager: [employer]  Tobacco Use  . Smoking status: Former Smoker    Packs/day: 3.50    Years: 20.00    Pack years: 70.00    Types: Cigarettes    Last attempt to quit: 12/02/1984    Years since quitting: 33.1  . Smokeless tobacco: Never Used  Substance and Sexual Activity  . Alcohol use: No    Comment: quit 85  . Drug use: No  . Sexual activity: Not on file  Other Topics Concern  . Not on file  Social History Narrative  . Not on file    Environmental and Social history  Lives in a house with a dry environment, a cat and dog located inside the household, carpet in the bedroom, plastic on the bed, plastic on the pillow, no smokers located inside the household.  He did smoke for approximately 20 years which she discontinued in 1986.  Objective:   Vitals:   01/12/18 0930  BP: 102/70  Pulse: 64  Resp: 20  Temp: 98 F (36.7 C)   Height: 6\' 1"  (185.4 cm) Weight: 258 lb 3.2 oz (117.1 kg)  Physical Exam  Constitutional: He is well-developed, well-nourished, and in no distress.  HENT:  Head: Normocephalic. Head is without right periorbital erythema and without left periorbital erythema.  Right Ear: External ear normal.  Left Ear: External ear normal.  Nose: Nose normal. No mucosal edema or rhinorrhea.  Mouth/Throat: Uvula is midline, oropharynx is clear and moist and mucous membranes are normal. No oropharyngeal exudate.  Bilateral hearing aid  Eyes: Conjunctivae and lids are normal. Pupils are equal, round, and reactive to light.  Neck: Trachea normal. No tracheal tenderness present. No tracheal deviation present. No thyromegaly present.  Cardiovascular: Normal rate, regular rhythm, S1 normal, S2 normal and normal heart sounds.  No murmur heard. Pulmonary/Chest: Effort normal and breath sounds normal. No stridor. No tachypnea. No respiratory distress. He has no wheezes. He has no rales. He exhibits no tenderness.  Abdominal: Soft. He exhibits no distension  and no mass. There is no hepatosplenomegaly. There is no tenderness. There is no rebound and no guarding.  Musculoskeletal: He exhibits no edema or tenderness.  Lymphadenopathy:       Head (right side): No tonsillar adenopathy present.       Head (left side): No tonsillar adenopathy present.    He has no cervical adenopathy.    He has no axillary adenopathy.  Neurological: He is alert. Gait normal.  Skin: No rash noted. He is not diaphoretic. No erythema. No pallor. Nails show no clubbing.  Psychiatric: Mood and affect normal.    Diagnostics: Allergy skin tests were performed.  He did not demonstrate any hypersensitivity against a screening panel of foods.  Assessment and Plan:    1. Anaphylaxis, initial encounter   2. Anaphylaxis due to hymenoptera venom, accidental or unintentional, initial encounter     1.  Allergen avoidance measures?  2.  EpiPen, Benadryl, MD/ER evaluation for allergic reaction  3.  During "flying insect season" can utilize the following:   A.  OTC Loratadine 10 mg daily  B.  OTC Ranitidine 300 mg daily (two 150mg  tablets)  4.  Blood -venom panel, alpha gal panel  5.  Further evaluation?  Yes, if recurrent reactions  6. Return to clinic in 1 year or earlier if problem  Rei has had some form of anaphylactic reaction with unknown trigger this January which may be tied up with alpha gal syndrome for which we will have him check an alpha gal panel.  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  If he does demonstrate hypersensitivity against venom I would recommend that he start a course of immunotherapy against this allergen.  I will contact him with the results of his blood tests once they are available for review.  Jiles Prows, MD Allergy /  Immunology [facility]

## 2018-01-12 NOTE — Patient Instructions (Addendum)
  1.  Allergen avoidance measures?  2.  EpiPen, Benadryl, MD/ER evaluation for allergic reaction  3.  During "flying insect season" can utilize the following:   A.  OTC Loratadine 10 mg daily  B.  OTC Ranitidine 300 mg daily (two 150mg  tablets)  4.  Blood -venom panel, alpha gal panel  5.  Further evaluation?  Yes, if recurrent reactions  6. Return to clinic in 1 year or earlier if problem

## 2018-01-13 ENCOUNTER — Encounter: Payer: Self-pay | Admitting: Allergy and Immunology

## 2018-01-14 ENCOUNTER — Ambulatory Visit: Payer: Medicare Other | Admitting: Cardiovascular Disease

## 2018-01-16 LAB — ALPHA-GAL PANEL
Alpha Gal IgE*: 14.9 kU/L — ABNORMAL HIGH (ref <0.10)
Beef (Bos spp) IgE: 10.6 kU/L — ABNORMAL HIGH (ref <0.35)
Class Interpretation: 3
Class Interpretation: 3
Lamb/Mutton (Ovis spp) IgE: 4.17 kU/L — ABNORMAL HIGH (ref <0.35)
PORK (SUS SPP) IGE: 6.43 kU/L — AB (ref <0.35)
PORK CLASS INTERPRETATION: 3

## 2018-01-16 LAB — ALLERGEN HYMENOPTERA PANEL
Bumblebee: <0.1 kU/L
I002-IGE HORNET, WHITE FACE: 3.08 kU/L — AB
I004-IGE PAPER WASP: 7.44 kU/L — AB
I005-IGE HORNET, YELLOW: 1.42 kU/L — AB
IGE HONEYBEE: 0.65 kU/L — AB
Yellow Jacket, IgE: 7.05 kU/L — AB

## 2018-01-26 DIAGNOSIS — R82998 Other abnormal findings in urine: Secondary | ICD-10-CM | POA: Diagnosis not present

## 2018-01-26 DIAGNOSIS — Z125 Encounter for screening for malignant neoplasm of prostate: Secondary | ICD-10-CM | POA: Diagnosis not present

## 2018-01-26 DIAGNOSIS — I10 Essential (primary) hypertension: Secondary | ICD-10-CM | POA: Diagnosis not present

## 2018-01-26 DIAGNOSIS — E7849 Other hyperlipidemia: Secondary | ICD-10-CM | POA: Diagnosis not present

## 2018-01-26 DIAGNOSIS — E119 Type 2 diabetes mellitus without complications: Secondary | ICD-10-CM | POA: Diagnosis not present

## 2018-01-30 ENCOUNTER — Other Ambulatory Visit: Payer: Self-pay | Admitting: Cardiovascular Disease

## 2018-02-02 DIAGNOSIS — G4730 Sleep apnea, unspecified: Secondary | ICD-10-CM | POA: Diagnosis not present

## 2018-02-02 DIAGNOSIS — F418 Other specified anxiety disorders: Secondary | ICD-10-CM | POA: Diagnosis not present

## 2018-02-02 DIAGNOSIS — M546 Pain in thoracic spine: Secondary | ICD-10-CM | POA: Diagnosis not present

## 2018-02-02 DIAGNOSIS — Z0000 Encounter for general adult medical examination without abnormal findings: Secondary | ICD-10-CM | POA: Diagnosis not present

## 2018-02-02 DIAGNOSIS — I10 Essential (primary) hypertension: Secondary | ICD-10-CM | POA: Diagnosis not present

## 2018-02-02 DIAGNOSIS — Z1389 Encounter for screening for other disorder: Secondary | ICD-10-CM | POA: Diagnosis not present

## 2018-02-02 DIAGNOSIS — K219 Gastro-esophageal reflux disease without esophagitis: Secondary | ICD-10-CM | POA: Diagnosis not present

## 2018-02-02 DIAGNOSIS — E7849 Other hyperlipidemia: Secondary | ICD-10-CM | POA: Diagnosis not present

## 2018-02-02 DIAGNOSIS — E669 Obesity, unspecified: Secondary | ICD-10-CM | POA: Diagnosis not present

## 2018-02-02 DIAGNOSIS — E119 Type 2 diabetes mellitus without complications: Secondary | ICD-10-CM | POA: Diagnosis not present

## 2018-02-02 DIAGNOSIS — N529 Male erectile dysfunction, unspecified: Secondary | ICD-10-CM | POA: Diagnosis not present

## 2018-03-05 ENCOUNTER — Ambulatory Visit: Payer: Medicare Other | Admitting: Cardiovascular Disease

## 2018-04-16 ENCOUNTER — Ambulatory Visit (INDEPENDENT_AMBULATORY_CARE_PROVIDER_SITE_OTHER): Payer: Medicare Other | Admitting: Cardiovascular Disease

## 2018-04-16 ENCOUNTER — Encounter: Payer: Self-pay | Admitting: Cardiovascular Disease

## 2018-04-16 VITALS — BP 132/72 | HR 68 | Ht 73.0 in | Wt 254.0 lb

## 2018-04-16 DIAGNOSIS — E782 Mixed hyperlipidemia: Secondary | ICD-10-CM

## 2018-04-16 DIAGNOSIS — I10 Essential (primary) hypertension: Secondary | ICD-10-CM | POA: Diagnosis not present

## 2018-04-16 DIAGNOSIS — I2510 Atherosclerotic heart disease of native coronary artery without angina pectoris: Secondary | ICD-10-CM

## 2018-04-16 NOTE — Patient Instructions (Signed)

## 2018-04-16 NOTE — Progress Notes (Signed)
Matthew Love,   [id number] Phone: [phone] Fax:  [phone]  Date:  04/16/2018   Name:  Matthew Love   DOB:  04-26-1951   MRN:  [id number]  PCP:  Burnard Bunting, MD  Primary Cardiologist:  Dr. Liam Rogers  Primary Electrophysiologist:  None    Notes prior to 2013:  Matthew Love is a 67 y.o. male who returns for evaluation of chest pain.  He has a history of HTN, DM2, HL, GERD and sleep apnea. He is noncompliant with CPAP.   Echo 11/11: EF 25-36%, grade 1 diastolic dysfunction, mild LAE.  ETT-Myoview 11/11: Inferior fixed defect likely due to diaphragmatic attenuation with normal wall motion, EF 69%.   Over the last 2-3 months, he has noted worsening left-sided chest pain. He can point to it with one finger. It feels dull. He denies any exertional chest pain. He notes it only at rest. He denies any radiating symptoms or associated nausea, diaphoresis. He denies associated shortness of breath. He does have dyspnea with more extreme activities. He denies orthopnea, PND or significant pedal edema. He has had a cough over last several months. This is productive of clear sputum. He was prescribed a Z-Pak at one point with his primary care provider. He denies any recent travels, injuries to his lower extremities or recent hospitalizations.  He denies pleuritic chest pain or chest pain with lying supine.  Sept. 17, 2013 -   He was seen by [name] in August for chest pain. A stress Myoview study was normal. He increase his Nexium and the mild chest discomfort improved.  Sept. 19, 2016:  Doing well  . Retired from [employer]..   Exercising  Some .   Walking 30 minutes every AM.   His foot and toes on left side get very cold   Sept. 21, 2017:  Matthew Love is seen today for a work in visit Had some chest pain  Center of chest.    Seemed to be worse when he put his hand on his chest  Awoke last night with some left sided chest pain   Usually occurs when he is lying down. Walking 20 minutes a day -  Has gained about 10 lbs over the past month    Nov. 20, 2017:    Matthew Love is seen back .  He had stenting of his proximal/mid LAD and stenting of his distal  in October, 2017. [name] Since that time he's had some lightheadedness / dizziness. Had a GI bug this past weekend.   Had some diarrhea and poor appetite.   He has improved his diet. Has lost weight - 25-30 lbs since his the stent procedure .  Is avoiding salt.    Feb. 26, 2018:  Has lost another 40 lbs. Is watching his diet  No CP .   Oct. 1, 2018:    Doing well.   No angina ,  Has MSK aches ,  No pain similar to her previous angina   , no dyspnea.  Not exercising as much.  Having some back issues.  January 09, 2018:  Matthew Love is doing well  No CP  Has not been sleeping well Has been having some chest pain  Has lots of dizziness.   Wife Matthew Love thinks it might be due to Imdur  Wants to start walking more  Retired from teaching   Apr 16, 2018:   No further episodes of CP  Walks 30 minutes  5 days a week.   Has been gaining weight  - 7 lbs since lst Nov. 2018  Wt Readings from Last 3 Encounters:  04/16/18 254 lb (115.2 kg)  01/12/18 258 lb 3.2 oz (117.1 kg)  01/09/18 254 lb (115.2 kg)     Past Medical History:  Diagnosis Date  . Anxiety   . Arthritis   . Coronary artery disease    a. NST 08/2016: high-risk w/ inferior and lateral wall ischemia  b. cath 09/2016: 80% mid-LAD and 90% distal LAD stenosis. Placement of 2 DES. Mild nonobstructive dz along RCA and LCx.  . Diabetes mellitus    A1C 6 weeks ago as of 09/08/14 was 6.2. Not on oral meds.  [name] GERD (gastroesophageal reflux disease)   . H/O cardiovascular stress test    ETT-Myoview 11/11: Inferior fixed defect likely due to diaphragmatic attenuation with normal wall motion, EF 69%  . H/O echocardiogram    Echo 11/11: EF 28-36%, grade 1 diastolic dysfunction, mild LAE  . H/O hiatal hernia   .  Hyperlipidemia   . Hypertension   . Mini stroke ([place])   . Obesity   . Pneumonia    hx  . Sleep apnea    cpap 1 yr  . Stroke [name]) 10    Current Outpatient Medications  Medication Sig Dispense Refill  . aspirin 81 MG tablet Take 81 mg by mouth daily.      [name] atorvastatin (LIPITOR) 40 MG tablet TAKE ONE TABLET BY MOUTH ONCE DAILY AT  6  PM 30 tablet 6  . clopidogrel (PLAVIX) 75 MG tablet Take 1 tablet (75 mg total) by mouth daily. 90 tablet 3  . EPINEPHrine (EPIPEN) 0.3 mg/0.3 mL SOAJ injection Inject 0.3 mLs (0.3 mg total) into the muscle once. For signs or symptoms of anaphylaxis. 1 Device 0  . escitalopram (LEXAPRO) 10 MG tablet Take 10 mg by mouth daily.     . irbesartan (AVAPRO) 75 MG tablet Take 1 tablet (75 mg total) by mouth daily. 30 tablet 11  . Multiple Vitamin (MULTIVITAMIN) tablet Take 1 tablet by mouth daily.      . nitroGLYCERIN (NITROSTAT) 0.4 MG SL tablet Place 0.4 mg under the tongue every 5 (five) minutes as needed for chest pain.    [name] OMEGA 3 1200 MG CAPS Take 1,200 mg by mouth daily.     . pantoprazole (PROTONIX) 40 MG tablet Take 40 mg by mouth 2 (two) times daily.    [name] triamterene-hydrochlorothiazide (DYAZIDE) 37.5-25 MG capsule Take 1 capsule by mouth daily.     No current facility-administered medications for this visit.     Allergies: Allergies  Allergen Reactions  . Bee Venom Anaphylaxis, Itching and Swelling  . Bee Pollen   . Hydrocodone-Acetaminophen Rash    Social History   Tobacco Use  . Smoking status: Former Smoker    Packs/day: 3.50    Years: 20.00    Pack years: 70.00    Types: Cigarettes    Last attempt to quit: 12/02/1984    Years since quitting: 33.3  . Smokeless tobacco: Never Used  Substance Use Topics  . Alcohol use: No    Comment: quit 86  . Drug use: No     ROS:  Please see the history of present illness.     All other systems reviewed and negative.    Physical Exam: Blood pressure 132/72, pulse 68, height 6' 1"  (1.854 m),  weight 254 lb (115.2 kg), SpO2 98 %.  GEN:  Well nourished, well developed in no acute distress HEENT: Normal NECK: No JVD; No carotid bruits LYMPHATICS: No lymphadenopathy CARDIAC: RR, no murmurs, rubs, gallops RESPIRATORY:  Clear to auscultation without rales, wheezing or rhonchi  ABDOMEN: Soft, non-tender, non-distended MUSCULOSKELETAL:  No edema; No deformity  SKIN: Warm and dry NEUROLOGIC:  Alert and oriented x 3   EKG:          ASSESSMENT AND PLAN:  1.    CAD :     S/p stenting of prox / mid LAD and distal LAD in OCt. 2017  2.   Hypertension:    BP is well controlled.        4.  Hyperlipidemia: -     Check labs in 6 months at next visit   5. Sleep apnea.    Mertie Moores, MD  04/16/2018 8:08 AM    [address],  [place], [place]  [address] Pager [phone] Phone: [phone]; Fax: [phone]

## 2018-07-06 ENCOUNTER — Other Ambulatory Visit: Payer: Self-pay | Admitting: Cardiovascular Disease

## 2018-07-06 MED ORDER — IRBESARTAN 75 MG PO TABS: 75.0000 mg | ORAL_TABLET | Freq: Every day | ORAL | 9 refills | Status: DC

## 2018-08-15 DIAGNOSIS — R079 Chest pain, unspecified: Secondary | ICD-10-CM | POA: Diagnosis not present

## 2018-08-15 DIAGNOSIS — Z87891 Personal history of nicotine dependence: Secondary | ICD-10-CM | POA: Diagnosis not present

## 2018-08-15 DIAGNOSIS — Z951 Presence of aortocoronary bypass graft: Secondary | ICD-10-CM | POA: Diagnosis not present

## 2018-08-15 DIAGNOSIS — Z8673 Personal history of transient ischemic attack (TIA), and cerebral infarction without residual deficits: Secondary | ICD-10-CM | POA: Diagnosis not present

## 2018-08-15 DIAGNOSIS — Z7902 Long term (current) use of antithrombotics/antiplatelets: Secondary | ICD-10-CM | POA: Diagnosis not present

## 2018-08-15 DIAGNOSIS — Z955 Presence of coronary angioplasty implant and graft: Secondary | ICD-10-CM | POA: Diagnosis not present

## 2018-08-15 DIAGNOSIS — I10 Essential (primary) hypertension: Secondary | ICD-10-CM | POA: Diagnosis not present

## 2018-08-15 DIAGNOSIS — Z7982 Long term (current) use of aspirin: Secondary | ICD-10-CM | POA: Diagnosis not present

## 2018-08-15 DIAGNOSIS — I2510 Atherosclerotic heart disease of native coronary artery without angina pectoris: Secondary | ICD-10-CM | POA: Diagnosis not present

## 2018-09-25 DIAGNOSIS — Z23 Encounter for immunization: Secondary | ICD-10-CM | POA: Diagnosis not present

## 2018-10-14 ENCOUNTER — Encounter: Payer: Self-pay | Admitting: Cardiovascular Disease

## 2018-10-14 ENCOUNTER — Ambulatory Visit (INDEPENDENT_AMBULATORY_CARE_PROVIDER_SITE_OTHER): Payer: Medicare Other | Admitting: Cardiovascular Disease

## 2018-10-14 VITALS — BP 108/74 | HR 62 | Ht 73.0 in | Wt 262.0 lb

## 2018-10-14 DIAGNOSIS — E782 Mixed hyperlipidemia: Secondary | ICD-10-CM | POA: Diagnosis not present

## 2018-10-14 DIAGNOSIS — I2510 Atherosclerotic heart disease of native coronary artery without angina pectoris: Secondary | ICD-10-CM

## 2018-10-14 NOTE — Patient Instructions (Signed)

## 2018-10-14 NOTE — Progress Notes (Signed)
[facility],   [address] Phone: [phone] Fax:  [phone]  Date:  10/14/2018   Name:  Matthew Love   DOB:  08/26/1951   MRN:  [id number]  PCP:  Burnard Bunting, MD  Primary Cardiologist:  Dr. Liam Rogers  Primary Electrophysiologist:  None    Notes prior to 2013:  Matthew Love is a 67 y.o. male who returns for evaluation of chest pain.  He has a history of HTN, DM2, HL, GERD and sleep apnea. He is noncompliant with CPAP.   Echo 11/11: EF 96-78%, grade 1 diastolic dysfunction, mild LAE.  ETT-Myoview 11/11: Inferior fixed defect likely due to diaphragmatic attenuation with normal wall motion, EF 69%.   Over the last 2-3 months, he has noted worsening left-sided chest pain. He can point to it with one finger. It feels dull. He denies any exertional chest pain. He notes it only at rest. He denies any radiating symptoms or associated nausea, diaphoresis. He denies associated shortness of breath. He does have dyspnea with more extreme activities. He denies orthopnea, PND or significant pedal edema. He has had a cough over last several months. This is productive of clear sputum. He was prescribed a Z-Pak at one point with his primary care provider. He denies any recent travels, injuries to his lower extremities or recent hospitalizations.  He denies pleuritic chest pain or chest pain with lying supine.  Sept. 17, 2013 -   He was seen by Matthew Love in August for chest pain. A stress Myoview study was normal. He increase his Nexium and the mild chest discomfort improved.  Sept. 19, 2016:  Doing well  . Retired from [employer]..   Exercising  Some .   Walking 30 minutes every AM.   His foot and toes on left side get very cold   Sept. 21, 2017:  Matthew Love is seen today for a work in visit Had some chest pain  Center of chest.    Seemed to be worse when he put his hand on his chest  Awoke last night with some left sided chest pain   Usually occurs when he is lying down. Walking 20 minutes a day -  Has gained about 10 lbs over the past month    Nov. 20, 2017:    Matthew Love is seen back .  He had stenting of his proximal/mid LAD and stenting of his distal  in October, 2017. [name] Since that time he's had some lightheadedness / dizziness. Had a GI bug this past weekend.   Had some diarrhea and poor appetite.   He has improved his diet. Has lost weight - 25-30 lbs since his the stent procedure .  Is avoiding salt.    Feb. 26, 2018:  Has lost another 40 lbs. Is watching his diet  No CP .   Oct. 1, 2018:    Doing well.   No angina ,  Has MSK aches ,  No pain similar to her previous angina   , no dyspnea.  Not exercising as much.  Having some back issues.  January 09, 2018:  Matthew Love is doing well  No CP  Has not been sleeping well Has been having some chest pain  Has lots of dizziness.   Wife Matthew Love thinks it might be due to Imdur  Wants to start walking more  Retired from teaching   Apr 16, 2018:   No further episodes of CP  Walks 30 minutes  5 days a week.   Has been gaining weight  - 7 lbs since lst Nov. 2018  Nov. 13, 2019: No   dyspnea Had 1 episode of dizziness while walking  Had an episode of CP in Sept - went to the ER Work up was negative.  Has not recurred.  Found nothing wrong Has gained weight.  Knows he has not been eating right .   exercising regularly.     Wt Readings from Last 3 Encounters:  10/14/18 262 lb (118.8 kg)  04/16/18 254 lb (115.2 kg)  01/12/18 258 lb 3.2 oz (117.1 kg)     Past Medical History:  Diagnosis Date  . Anxiety   . Arthritis   . Coronary artery disease    a. NST 08/2016: high-risk w/ inferior and lateral wall ischemia  b. cath 09/2016: 80% mid-LAD and 90% distal LAD stenosis. Placement of 2 DES. Mild nonobstructive dz along RCA and LCx.  . Diabetes mellitus    A1C 6 weeks ago as of 09/08/14 was 6.2. Not on oral meds.  [name] GERD (gastroesophageal reflux disease)   .  H/O cardiovascular stress test    ETT-Myoview 11/11: Inferior fixed defect likely due to diaphragmatic attenuation with normal wall motion, EF 69%  . H/O echocardiogram    Echo 11/11: EF 28-78%, grade 1 diastolic dysfunction, mild LAE  . H/O hiatal hernia   . Hyperlipidemia   . Hypertension   . Mini stroke ([place])   . Obesity   . Pneumonia    hx  . Sleep apnea    cpap 1 yr  . Stroke [facility]) 10    Current Outpatient Medications  Medication Sig Dispense Refill  . aspirin 81 MG tablet Take 81 mg by mouth daily.      [name] atorvastatin (LIPITOR) 40 MG tablet TAKE ONE TABLET BY MOUTH ONCE DAILY AT  6  PM 30 tablet 6  . clopidogrel (PLAVIX) 75 MG tablet Take 1 tablet (75 mg total) by mouth daily. 90 tablet 3  . EPINEPHrine (EPIPEN) 0.3 mg/0.3 mL SOAJ injection Inject 0.3 mLs (0.3 mg total) into the muscle once. For signs or symptoms of anaphylaxis. 1 Device 0  . escitalopram (LEXAPRO) 10 MG tablet Take 10 mg by mouth daily.     . irbesartan (AVAPRO) 75 MG tablet Take 1 tablet (75 mg total) by mouth daily. 30 tablet 9  . Multiple Vitamin (MULTIVITAMIN) tablet Take 1 tablet by mouth daily.      . nitroGLYCERIN (NITROSTAT) 0.4 MG SL tablet Place 0.4 mg under the tongue every 5 (five) minutes as needed for chest pain.    [name] OMEGA 3 1200 MG CAPS Take 1,200 mg by mouth daily.     . pantoprazole (PROTONIX) 40 MG tablet Take 40 mg by mouth 2 (two) times daily.    [name] triamterene-hydrochlorothiazide (DYAZIDE) 37.5-25 MG capsule Take 1 capsule by mouth daily.     No current facility-administered medications for this visit.     Allergies: Allergies  Allergen Reactions  . Bee Venom Anaphylaxis, Itching and Swelling  . Bee Pollen   . Hydrocodone-Acetaminophen Rash    Social History   Tobacco Use  . Smoking status: Former Smoker    Packs/day: 3.50    Years: 20.00    Pack years: 70.00    Types: Cigarettes    Last attempt to quit: 12/02/1984    Years since quitting: 33.8  . Smokeless tobacco: Never  Used  Substance Use Topics  . Alcohol use:  No    Comment: quit 86  . Drug use: No     ROS:  Please see the history of present illness.     All other systems reviewed and negative.    Physical Exam: Blood pressure 108/74, pulse 62, height _0  (1.854 m), weight 262 lb (118.8 kg), SpO2 98 %.  GEN:  Well nourished, well developed in no acute distress HEENT: Normal NECK: No JVD; No carotid bruits LYMPHATICS: No lymphadenopathy CARDIAC: RR, no murmurs, rubs, gallops RESPIRATORY:  Clear to auscultation without rales, wheezing or rhonchi  ABDOMEN: Soft, non-tender, non-distended MUSCULOSKELETAL:  No edema; No deformity  SKIN: Warm and dry NEUROLOGIC:  Alert and oriented x 3   EKG:        October 14, 2018: Normal sinus rhythm at 62.  Indeterminate axis.  Otherwise normal EKG.  ASSESSMENT AND PLAN:  1.    CAD :     S/p stenting of prox / mid LAD and distal LAD in OCt. 2017 No angina  Had 1 atypical episode of CP - went to there ER , work up was negative.  Lipids are managed by his medical doctor    2.   Hypertension:           BP has been well controlled  Advised weight loss   4.  Hyperlipidemia: -   Managed by his primary MD   5. Sleep apnea.    Mertie Moores, MD  10/14/2018 10:34 AM    [address],  [place], [place]  [address] Pager [phone] Phone: [phone]; Fax: [phone]

## 2018-11-13 ENCOUNTER — Other Ambulatory Visit: Payer: Self-pay | Admitting: Cardiovascular Disease

## 2018-11-20 DIAGNOSIS — J019 Acute sinusitis, unspecified: Secondary | ICD-10-CM | POA: Diagnosis not present

## 2019-01-08 ENCOUNTER — Other Ambulatory Visit: Payer: Self-pay | Admitting: Cardiovascular Disease

## 2019-02-01 DIAGNOSIS — E119 Type 2 diabetes mellitus without complications: Secondary | ICD-10-CM | POA: Diagnosis not present

## 2019-02-01 DIAGNOSIS — Z125 Encounter for screening for malignant neoplasm of prostate: Secondary | ICD-10-CM | POA: Diagnosis not present

## 2019-02-01 DIAGNOSIS — I10 Essential (primary) hypertension: Secondary | ICD-10-CM | POA: Diagnosis not present

## 2019-02-01 DIAGNOSIS — R82998 Other abnormal findings in urine: Secondary | ICD-10-CM | POA: Diagnosis not present

## 2019-02-01 DIAGNOSIS — E7849 Other hyperlipidemia: Secondary | ICD-10-CM | POA: Diagnosis not present

## 2019-02-08 DIAGNOSIS — Z1331 Encounter for screening for depression: Secondary | ICD-10-CM | POA: Diagnosis not present

## 2019-02-08 DIAGNOSIS — Z6834 Body mass index (BMI) 34.0-34.9, adult: Secondary | ICD-10-CM | POA: Diagnosis not present

## 2019-02-08 DIAGNOSIS — E7849 Other hyperlipidemia: Secondary | ICD-10-CM | POA: Diagnosis not present

## 2019-02-08 DIAGNOSIS — F418 Other specified anxiety disorders: Secondary | ICD-10-CM | POA: Diagnosis not present

## 2019-02-08 DIAGNOSIS — G4730 Sleep apnea, unspecified: Secondary | ICD-10-CM | POA: Diagnosis not present

## 2019-02-08 DIAGNOSIS — E669 Obesity, unspecified: Secondary | ICD-10-CM | POA: Diagnosis not present

## 2019-02-08 DIAGNOSIS — N529 Male erectile dysfunction, unspecified: Secondary | ICD-10-CM | POA: Diagnosis not present

## 2019-02-08 DIAGNOSIS — K219 Gastro-esophageal reflux disease without esophagitis: Secondary | ICD-10-CM | POA: Diagnosis not present

## 2019-02-08 DIAGNOSIS — Z0000 Encounter for general adult medical examination without abnormal findings: Secondary | ICD-10-CM | POA: Diagnosis not present

## 2019-02-08 DIAGNOSIS — E1169 Type 2 diabetes mellitus with other specified complication: Secondary | ICD-10-CM | POA: Diagnosis not present

## 2019-02-08 DIAGNOSIS — M546 Pain in thoracic spine: Secondary | ICD-10-CM | POA: Diagnosis not present

## 2019-02-08 DIAGNOSIS — I10 Essential (primary) hypertension: Secondary | ICD-10-CM | POA: Diagnosis not present

## 2019-04-16 ENCOUNTER — Other Ambulatory Visit: Payer: Self-pay | Admitting: Cardiovascular Disease

## 2019-06-28 DIAGNOSIS — I10 Essential (primary) hypertension: Secondary | ICD-10-CM | POA: Diagnosis not present

## 2019-06-28 DIAGNOSIS — E1169 Type 2 diabetes mellitus with other specified complication: Secondary | ICD-10-CM | POA: Diagnosis not present

## 2019-06-28 DIAGNOSIS — E7429 Other disorders of galactose metabolism: Secondary | ICD-10-CM | POA: Diagnosis not present

## 2019-06-28 DIAGNOSIS — G4730 Sleep apnea, unspecified: Secondary | ICD-10-CM | POA: Diagnosis not present

## 2019-06-28 DIAGNOSIS — F418 Other specified anxiety disorders: Secondary | ICD-10-CM | POA: Diagnosis not present

## 2019-06-28 DIAGNOSIS — E669 Obesity, unspecified: Secondary | ICD-10-CM | POA: Diagnosis not present

## 2019-06-28 DIAGNOSIS — K219 Gastro-esophageal reflux disease without esophagitis: Secondary | ICD-10-CM | POA: Diagnosis not present

## 2019-06-28 DIAGNOSIS — N529 Male erectile dysfunction, unspecified: Secondary | ICD-10-CM | POA: Diagnosis not present

## 2019-06-28 DIAGNOSIS — E785 Hyperlipidemia, unspecified: Secondary | ICD-10-CM | POA: Diagnosis not present

## 2019-06-28 DIAGNOSIS — M546 Pain in thoracic spine: Secondary | ICD-10-CM | POA: Diagnosis not present

## 2019-07-05 DIAGNOSIS — H6123 Impacted cerumen, bilateral: Secondary | ICD-10-CM | POA: Diagnosis not present

## 2019-07-05 DIAGNOSIS — H9113 Presbycusis, bilateral: Secondary | ICD-10-CM | POA: Diagnosis not present

## 2019-08-10 DIAGNOSIS — Z23 Encounter for immunization: Secondary | ICD-10-CM | POA: Diagnosis not present

## 2019-10-10 ENCOUNTER — Other Ambulatory Visit: Payer: Self-pay | Admitting: Cardiovascular Disease

## 2019-10-29 ENCOUNTER — Other Ambulatory Visit: Payer: Self-pay | Admitting: Cardiovascular Disease

## 2019-11-22 ENCOUNTER — Other Ambulatory Visit: Payer: Self-pay | Admitting: Cardiovascular Disease

## 2019-12-10 ENCOUNTER — Ambulatory Visit: Payer: Medicare Other | Admitting: Cardiovascular Disease

## 2019-12-23 ENCOUNTER — Other Ambulatory Visit: Payer: Self-pay | Admitting: Cardiovascular Disease

## 2020-01-13 ENCOUNTER — Other Ambulatory Visit: Payer: Self-pay

## 2020-01-13 ENCOUNTER — Telehealth: Payer: Medicare Other | Admitting: Cardiovascular Disease

## 2020-01-14 ENCOUNTER — Other Ambulatory Visit (HOSPITAL_COMMUNITY): Payer: Self-pay | Admitting: Cardiovascular Disease

## 2020-01-14 ENCOUNTER — Ambulatory Visit (INDEPENDENT_AMBULATORY_CARE_PROVIDER_SITE_OTHER): Payer: Medicare Other | Admitting: Cardiovascular Disease

## 2020-01-14 ENCOUNTER — Other Ambulatory Visit (HOSPITAL_COMMUNITY): Payer: Self-pay | Admitting: Interventional Cardiology

## 2020-01-14 ENCOUNTER — Other Ambulatory Visit: Payer: Self-pay

## 2020-01-14 ENCOUNTER — Ambulatory Visit: Payer: Medicare Other | Admitting: Cardiovascular Disease

## 2020-01-14 ENCOUNTER — Encounter: Payer: Self-pay | Admitting: Nurse Practitioner

## 2020-01-14 ENCOUNTER — Encounter: Payer: Self-pay | Admitting: Cardiovascular Disease

## 2020-01-14 VITALS — BP 122/68 | HR 59 | Ht 74.0 in | Wt 270.0 lb

## 2020-01-14 DIAGNOSIS — I25118 Atherosclerotic heart disease of native coronary artery with other forms of angina pectoris: Secondary | ICD-10-CM | POA: Diagnosis not present

## 2020-01-14 DIAGNOSIS — I739 Peripheral vascular disease, unspecified: Secondary | ICD-10-CM

## 2020-01-14 DIAGNOSIS — E782 Mixed hyperlipidemia: Secondary | ICD-10-CM | POA: Diagnosis not present

## 2020-01-14 DIAGNOSIS — I10 Essential (primary) hypertension: Secondary | ICD-10-CM

## 2020-01-14 LAB — LIPID PANEL
Chol/HDL Ratio: 2.5 ratio (ref 0.0–5.0)
Cholesterol, Total: 109 mg/dL (ref 100–199)
HDL: 44 mg/dL (ref >39)
LDL Chol Calc (NIH): 46 mg/dL (ref 0–99)
Triglycerides: 104 mg/dL (ref 0–149)
VLDL Cholesterol Cal: 19 mg/dL (ref 5–40)

## 2020-01-14 LAB — HEPATIC FUNCTION PANEL
ALT: 17 IU/L (ref 0–44)
AST: 18 IU/L (ref 0–40)
Albumin: 4.6 g/dL (ref 3.8–4.8)
Alkaline Phosphatase: 59 IU/L (ref 39–117)
Bilirubin Total: 0.8 mg/dL (ref 0.0–1.2)
Bilirubin, Direct: 0.25 mg/dL (ref 0.00–0.40)
Total Protein: 6.9 g/dL (ref 6.0–8.5)

## 2020-01-14 LAB — BASIC METABOLIC PANEL
BUN/Creatinine Ratio: 15 (ref 10–24)
BUN: 17 mg/dL (ref 8–27)
CO2: 25 mmol/L (ref 20–29)
Calcium: 9.5 mg/dL (ref 8.6–10.2)
Chloride: 100 mmol/L (ref 96–106)
Creatinine, Ser: 1.13 mg/dL (ref 0.76–1.27)
GFR calc Af Amer: 76 mL/min/{1.73_m2} (ref >59)
GFR calc non Af Amer: 66 mL/min/{1.73_m2} (ref >59)
Glucose: 115 mg/dL — ABNORMAL HIGH (ref 65–99)
Potassium: 4 mmol/L (ref 3.5–5.2)
Sodium: 141 mmol/L (ref 134–144)

## 2020-01-14 MED ORDER — NITROGLYCERIN 0.4 MG SL SUBL: 0.4000 mg | SUBLINGUAL_TABLET | SUBLINGUAL | 6 refills | Status: DC | PRN

## 2020-01-14 NOTE — Patient Instructions (Addendum)
Medication Instructions:  Your physician recommends that you continue on your current medications as directed. Please refer to the Current Medication list given to you today. **A Refill of your Nitroglycerin was sent to your pharmacy *If you need a refill on your cardiac medications before your next appointment, please call your pharmacy*  Lab Work: TODAY - cholesterol, liver panel, basic metabolic panel If you have labs (blood work) drawn today and your tests are completely normal, you will receive your results only by: [name] MyChart Message (if you have MyChart) OR . A paper copy in the mail If you have any lab test that is abnormal or we need to change your treatment, we will call you to review the results.   Testing/Procedures: Your physician has requested that you have a lexiscan myoview. For further information please visit [email or web address]. Please follow instruction sheet, as given.  Your physician has requested that you have a lower extremity arterial duplex. This test is an ultrasound of the arteries in the legs or arms. It looks at arterial blood flow in the legs and arms. Allow one hour for Lower and Upper Arterial scans. There are no restrictions or special instructions   Follow-Up: At [facility], you and your health needs are our priority.  As part of our continuing mission to provide you with exceptional heart care, we have created designated Provider Care Teams.  These Care Teams include your primary Cardiologist (physician) and Advanced Practice Providers (APPs -  Physician Assistants and Nurse Practitioners) who all work together to provide you with the care you need, when you need it.  Your next appointment:   1 year(s)  The format for your next appointment:   In Person  Provider:   You may see Mertie Moores, MD or one of the following Advanced Practice Providers on your designated Care Team:    [name], PA-C  [place], [place]  Daune Perch, [place]

## 2020-01-14 NOTE — Progress Notes (Signed)
[address] [place], [place]  [address] Phone: [phone] Fax:  [phone]  Date:  01/14/2020   Name:  Matthew Love   DOB:  January 10, 1951   MRN:  [id number]  PCP:  Burnard Bunting, MD  Primary Cardiologist:  Dr. Liam Rogers  Primary Electrophysiologist:  None    Notes prior to 2013:  Matthew Love is a 69 y.o. male who returns for evaluation of chest pain.  He has a history of HTN, DM2, HL, GERD and sleep apnea. He is noncompliant with CPAP.   Echo 11/11: EF 44-01%, grade 1 diastolic dysfunction, mild LAE.  ETT-Myoview 11/11: Inferior fixed defect likely due to diaphragmatic attenuation with normal wall motion, EF 69%.   Over the last 2-3 months, he has noted worsening left-sided chest pain. He can point to it with one finger. It feels dull. He denies any exertional chest pain. He notes it only at rest. He denies any radiating symptoms or associated nausea, diaphoresis. He denies associated shortness of breath. He does have dyspnea with more extreme activities. He denies orthopnea, PND or significant pedal edema. He has had a cough over last several months. This is productive of clear sputum. He was prescribed a Z-Pak at one point with his primary care provider. He denies any recent travels, injuries to his lower extremities or recent hospitalizations.  He denies pleuritic chest pain or chest pain with lying supine.  Sept. 17, 2013 -   He was seen by [name] in August for chest pain. A stress Myoview study was normal. He increase his Nexium and the mild chest discomfort improved.  Sept. 19, 2016:  Doing well  . Retired from [employer]..   Exercising  Some .   Walking 30 minutes every AM.   His foot and toes on left side get very cold   Sept. 21, 2017:  Matthew Love is seen today for a work in visit Had some chest pain  Center of chest.    Seemed to be worse when he put his hand on his chest  Awoke last night with some left sided chest pain   Usually occurs when he is lying down. Walking 20 minutes a day -  Has gained about 10 lbs over the past month    Nov. 20, 2017:    Matthew Love is seen back .  He had stenting of his proximal/mid LAD and stenting of his distal  in October, 2017. [name] Since that time he's had some lightheadedness / dizziness. Had a GI bug this past weekend.   Had some diarrhea and poor appetite.   He has improved his diet. Has lost weight - 25-30 lbs since his the stent procedure .  Is avoiding salt.    Feb. 26, 2018:  Has lost another 40 lbs. Is watching his diet  No CP .   Oct. 1, 2018:    Doing well.   No angina ,  Has MSK aches ,  No pain similar to her previous angina   , no dyspnea.  Not exercising as much.  Having some back issues.  January 09, 2018:  Matthew Love is doing well  No CP  Has not been sleeping well Has been having some chest pain  Has lots of dizziness.   Wife [name] thinks it might be due to Imdur  Wants to start walking more  Retired from teaching   Apr 16, 2018:   No further episodes of CP  Walks 30 minutes  5 days a week.   Has been gaining weight  - 7 lbs since lst Nov. 2018  Nov. 13, 2019: No   dyspnea Had 1 episode of dizziness while walking  Had an episode of CP in Sept - went to the ER Work up was negative.  Has not recurred.  Found nothing wrong Has gained weight.  Knows he has not been eating right .   exercising regularly.     Feb. 12, 2021: Matthew Love is seen today for a work in visit because of some episodes of chest discomfort. Does not know if these are similar to his previous episodes  He has a history of coronary artery disease.  He had a stent placed to the proximal to mid LAD in October, 2017.  He also had a distal LAD stent at that same time. Has occasional CP ,  Better after he took several aspirin   Is having some right leg pain   Worse after he has been sitting down .   Is not worsened with exertion.   Sounds more like radicular pain .    Wt Readings  from Last 3 Encounters:  10/14/18 262 lb (118.8 kg)  04/16/18 254 lb (115.2 kg)  01/12/18 258 lb 3.2 oz (117.1 kg)     Past Medical History:  Diagnosis Date  . Anxiety   . Arthritis   . Coronary artery disease    a. NST 08/2016: high-risk w/ inferior and lateral wall ischemia  b. cath 09/2016: 80% mid-LAD and 90% distal LAD stenosis. Placement of 2 DES. Mild nonobstructive dz along RCA and LCx.  . Diabetes mellitus    A1C 6 weeks ago as of 09/08/14 was 6.2. Not on oral meds.  [name] GERD (gastroesophageal reflux disease)   . H/O cardiovascular stress test    ETT-Myoview 11/11: Inferior fixed defect likely due to diaphragmatic attenuation with normal wall motion, EF 69%  . H/O echocardiogram    Echo 11/11: EF 95-18%, grade 1 diastolic dysfunction, mild LAE  . H/O hiatal hernia   . Hyperlipidemia   . Hypertension   . Mini stroke ([place])   . Obesity   . Pneumonia    hx  . Sleep apnea    cpap 1 yr  . Stroke [facility]) 10    Current Outpatient Medications  Medication Sig Dispense Refill  . aspirin 81 MG tablet Take 81 mg by mouth daily.      [name] atorvastatin (LIPITOR) 40 MG tablet TAKE ONE TABLET BY MOUTH ONCE DAILY AT  6  PM 30 tablet 6  . clopidogrel (PLAVIX) 75 MG tablet Take 1 tablet (75 mg total) by mouth daily. PLAVIX - Should not be held prior to cath procedure unless specifically ordered.  Pt must keep upcoming appt in Jan for further refills 90 tablet 0  . EPINEPHrine (EPIPEN) 0.3 mg/0.3 mL SOAJ injection Inject 0.3 mLs (0.3 mg total) into the muscle once. For signs or symptoms of anaphylaxis. 1 Device 0  . escitalopram (LEXAPRO) 10 MG tablet Take 10 mg by mouth daily.     . irbesartan (AVAPRO) 75 MG tablet Take 1 tablet (75 mg total) by mouth daily. Please keep upcoming appt in February with Dr. Acie Fredrickson before anymore refills. Thank you 30 tablet 0  . Multiple Vitamin (MULTIVITAMIN) tablet Take 1 tablet by mouth daily.      . nitroGLYCERIN (NITROSTAT) 0.4 MG SL tablet Place 0.4 mg under  the tongue every 5 (five) minutes as needed for chest pain.    [name]  OMEGA 3 1200 MG CAPS Take 1,200 mg by mouth daily.     . pantoprazole (PROTONIX) 40 MG tablet Take 40 mg by mouth 2 (two) times daily.    [name] triamterene-hydrochlorothiazide (DYAZIDE) 37.5-25 MG capsule Take 1 capsule by mouth once daily 90 capsule 0   No current facility-administered medications for this visit.    Allergies: Allergies  Allergen Reactions  . Bee Venom Anaphylaxis, Itching and Swelling  . Bee Pollen   . Hydrocodone-Acetaminophen Rash    Social History   Tobacco Use  . Smoking status: Former Smoker    Packs/day: 3.50    Years: 20.00    Pack years: 70.00    Types: Cigarettes    Quit date: 12/02/1984    Years since quitting: 35.1  . Smokeless tobacco: Never Used  Substance Use Topics  . Alcohol use: No    Comment: quit 85  . Drug use: No     ROS:  Please see the history of present illness.     All other systems reviewed and negative.     Physical Exam: Blood pressure 122/68, pulse (!) 59, height 6' 2"  (1.88 m), weight 270 lb (122.5 kg), SpO2 98 %.  GEN:  Well nourished, well developed in no acute distress HEENT: Normal NECK: No JVD; No carotid bruits LYMPHATICS: No lymphadenopathy CARDIAC: RRR , no murmurs, rubs, gallops RESPIRATORY:  Clear to auscultation without rales, wheezing or rhonchi  ABDOMEN: Soft, non-tender, non-distended MUSCULOSKELETAL:  No edema; No deformity  SKIN: Warm and dry NEUROLOGIC:  Alert and oriented x 3    EKG:        January 14, 2020: Sinus bradycardia 59.  Nonspecific ST and T wave changes.  ASSESSMENT AND PLAN:  1.    CAD :     S/p stenting of prox / mid LAD and distal LAD in OCt. 2017 He continues to have intermittent episodes of chest discomfort.  He really does not exercise.  We will schedule him for a [place] study for further evaluation.  I encouraged him to try taking a nitroglycerin instead of extra aspirin if he has episodes of chest  discomfort.  This may help [place] differentiate whether these chest pains are due to angina or not.   2.   Hypertension:         Blood pressures fairly well controlled.  I once again encouraged him to work on diet, exercise, weight loss.  4.  Hyperlipidemia: -His lipids from a year ago at Dr. Jacquiline Doe office look good.  We will recheck fasting lipids, liver enzymes, basic metabolic profile today.  5.  Leg discomfort:  ? Claudication:    He is concerned that he may have some claudication symptoms.  We will get lower extremity screening ABIs.  I suspect that he may have an spine / disc issue.  I encouraged him to talk to Dr. Reynaldo Minium about this.   Mertie Moores, MD  01/14/2020 9:03 AM    [address],  [address], [place]  [address] Pager [phone] Phone: [phone]; Fax: [phone]

## 2020-01-23 ENCOUNTER — Other Ambulatory Visit: Payer: Self-pay | Admitting: Cardiovascular Disease

## 2020-01-24 ENCOUNTER — Other Ambulatory Visit: Payer: Self-pay

## 2020-01-24 MED ORDER — CLOPIDOGREL BISULFATE 75 MG PO TABS: 75.0000 mg | ORAL_TABLET | Freq: Every day | ORAL | 3 refills | Status: DC

## 2020-01-25 ENCOUNTER — Ambulatory Visit (HOSPITAL_COMMUNITY)
Admission: RE | Admit: 2020-01-25 | Payer: Medicare Other | Source: Ambulatory Visit | Attending: Cardiovascular Disease | Admitting: Cardiovascular Disease

## 2020-01-31 ENCOUNTER — Telehealth (HOSPITAL_COMMUNITY): Payer: Self-pay

## 2020-01-31 NOTE — Telephone Encounter (Signed)
Detailed instructions left on the patient's cell phone per DPR. Asked to call back with any questions. S.Malvern Kadlec EMTP

## 2020-02-01 ENCOUNTER — Other Ambulatory Visit: Payer: Self-pay

## 2020-02-01 ENCOUNTER — Ambulatory Visit (HOSPITAL_COMMUNITY): Payer: Medicare Other | Attending: Cardiology

## 2020-02-01 DIAGNOSIS — I10 Essential (primary) hypertension: Secondary | ICD-10-CM | POA: Insufficient documentation

## 2020-02-01 DIAGNOSIS — E782 Mixed hyperlipidemia: Secondary | ICD-10-CM | POA: Insufficient documentation

## 2020-02-01 DIAGNOSIS — I25118 Atherosclerotic heart disease of native coronary artery with other forms of angina pectoris: Secondary | ICD-10-CM | POA: Insufficient documentation

## 2020-02-01 MED ORDER — TECHNETIUM TC 99M TETROFOSMIN IV KIT
32.7000 | PACK | Freq: Once | INTRAVENOUS | Status: AC | PRN
  Administered 2020-02-01: 08:00:00 32.7 via INTRAVENOUS
  Filled 2020-02-01: qty 33

## 2020-02-01 MED ORDER — REGADENOSON 0.4 MG/5ML IV SOLN
0.4000 mg | Freq: Once | INTRAVENOUS | Status: AC
  Administered 2020-02-01: 0.4 mg via INTRAVENOUS

## 2020-02-02 ENCOUNTER — Ambulatory Visit (HOSPITAL_COMMUNITY): Payer: Medicare Other | Attending: Cardiology

## 2020-02-02 LAB — MYOCARDIAL PERFUSION IMAGING
LV dias vol: 73 mL (ref 62–150)
LV sys vol: 32 mL
Peak HR: 90 {beats}/min
Rest HR: 65 {beats}/min
SDS: 0
SRS: 0
SSS: 0
TID: 1.15

## 2020-02-02 MED ORDER — TECHNETIUM TC 99M TETROFOSMIN IV KIT
32.6000 | PACK | Freq: Once | INTRAVENOUS | Status: AC | PRN
  Administered 2020-02-02: 32.6 via INTRAVENOUS
  Filled 2020-02-02: qty 33

## 2020-02-07 DIAGNOSIS — E1169 Type 2 diabetes mellitus with other specified complication: Secondary | ICD-10-CM | POA: Diagnosis not present

## 2020-02-07 DIAGNOSIS — Z125 Encounter for screening for malignant neoplasm of prostate: Secondary | ICD-10-CM | POA: Diagnosis not present

## 2020-02-07 DIAGNOSIS — E7849 Other hyperlipidemia: Secondary | ICD-10-CM | POA: Diagnosis not present

## 2020-02-14 DIAGNOSIS — F418 Other specified anxiety disorders: Secondary | ICD-10-CM | POA: Diagnosis not present

## 2020-02-14 DIAGNOSIS — E669 Obesity, unspecified: Secondary | ICD-10-CM | POA: Diagnosis not present

## 2020-02-14 DIAGNOSIS — E1169 Type 2 diabetes mellitus with other specified complication: Secondary | ICD-10-CM | POA: Diagnosis not present

## 2020-02-14 DIAGNOSIS — Z1339 Encounter for screening examination for other mental health and behavioral disorders: Secondary | ICD-10-CM | POA: Diagnosis not present

## 2020-02-14 DIAGNOSIS — Z0000 Encounter for general adult medical examination without abnormal findings: Secondary | ICD-10-CM | POA: Diagnosis not present

## 2020-02-14 DIAGNOSIS — I10 Essential (primary) hypertension: Secondary | ICD-10-CM | POA: Diagnosis not present

## 2020-02-14 DIAGNOSIS — G4730 Sleep apnea, unspecified: Secondary | ICD-10-CM | POA: Diagnosis not present

## 2020-02-14 DIAGNOSIS — E785 Hyperlipidemia, unspecified: Secondary | ICD-10-CM | POA: Diagnosis not present

## 2020-02-14 DIAGNOSIS — M546 Pain in thoracic spine: Secondary | ICD-10-CM | POA: Diagnosis not present

## 2020-02-14 DIAGNOSIS — Z1331 Encounter for screening for depression: Secondary | ICD-10-CM | POA: Diagnosis not present

## 2020-02-14 DIAGNOSIS — K219 Gastro-esophageal reflux disease without esophagitis: Secondary | ICD-10-CM | POA: Diagnosis not present

## 2020-02-16 DIAGNOSIS — R82998 Other abnormal findings in urine: Secondary | ICD-10-CM | POA: Diagnosis not present

## 2020-02-16 DIAGNOSIS — I10 Essential (primary) hypertension: Secondary | ICD-10-CM | POA: Diagnosis not present

## 2020-03-16 DIAGNOSIS — B079 Viral wart, unspecified: Secondary | ICD-10-CM | POA: Diagnosis not present

## 2020-03-16 DIAGNOSIS — L821 Other seborrheic keratosis: Secondary | ICD-10-CM | POA: Diagnosis not present

## 2020-03-16 DIAGNOSIS — D485 Neoplasm of uncertain behavior of skin: Secondary | ICD-10-CM | POA: Diagnosis not present

## 2020-03-16 DIAGNOSIS — Z85828 Personal history of other malignant neoplasm of skin: Secondary | ICD-10-CM | POA: Diagnosis not present

## 2020-03-16 DIAGNOSIS — L570 Actinic keratosis: Secondary | ICD-10-CM | POA: Diagnosis not present

## 2020-03-16 DIAGNOSIS — D229 Melanocytic nevi, unspecified: Secondary | ICD-10-CM | POA: Diagnosis not present

## 2020-03-16 DIAGNOSIS — D1801 Hemangioma of skin and subcutaneous tissue: Secondary | ICD-10-CM | POA: Diagnosis not present

## 2020-03-16 DIAGNOSIS — L819 Disorder of pigmentation, unspecified: Secondary | ICD-10-CM | POA: Diagnosis not present

## 2020-03-16 DIAGNOSIS — L905 Scar conditions and fibrosis of skin: Secondary | ICD-10-CM | POA: Diagnosis not present

## 2020-06-23 ENCOUNTER — Encounter: Payer: Self-pay | Admitting: Internal Medicine

## 2020-08-02 ENCOUNTER — Ambulatory Visit (INDEPENDENT_AMBULATORY_CARE_PROVIDER_SITE_OTHER): Payer: Medicare Other | Admitting: Physician Assistant

## 2020-08-02 ENCOUNTER — Telehealth: Payer: Self-pay | Admitting: Physician Assistant

## 2020-08-02 ENCOUNTER — Encounter: Payer: Self-pay | Admitting: Physician Assistant

## 2020-08-02 VITALS — BP 106/70 | HR 68 | Ht 71.5 in | Wt 270.1 lb

## 2020-08-02 DIAGNOSIS — Z86010 Personal history of colonic polyps: Secondary | ICD-10-CM | POA: Diagnosis not present

## 2020-08-02 DIAGNOSIS — Z7901 Long term (current) use of anticoagulants: Secondary | ICD-10-CM

## 2020-08-02 DIAGNOSIS — I25118 Atherosclerotic heart disease of native coronary artery with other forms of angina pectoris: Secondary | ICD-10-CM

## 2020-08-02 MED ORDER — PLENVU 140 G PO SOLR: 1.0000 | Freq: Once | ORAL | 0 refills | Status: AC

## 2020-08-02 MED ORDER — SUTAB 1479-225-188 MG PO TABS: 1.0000 | ORAL_TABLET | Freq: Once | ORAL | 0 refills | Status: DC

## 2020-08-02 NOTE — Progress Notes (Signed)
Chief Complaint: Preprocedural visit for a surveillance colonoscopy on chronic anticoagulation  HPI:    [name] is a 69 year old male, known to Dr. Henrene Pastor, with a past medical history of CAD status post DES in 2017 on Plavix (echo 10/2010 with EF 60-65%), who presents clinic today for a preprocedural visit prior to surveillance colonoscopy.    06/08/2015 colonoscopy for surveillance following up with a 2011 colonoscopy with adenoma and colonic neoplasia.  1 single polyp found in the ascending colon, moderate diverticulosis in the sigmoid colon otherwise normal.  Pathology showed tubular adenoma.  Repeat recommended in 5 years.    Today, the patient presents to clinic and tells me he is doing very well.  He has no complaints or concerns.  He is ready for his colonoscopy.    Denies fever, chills, change in bowels, abdominal pain, heartburn, reflux or symptoms that awaken him from sleep.  Past Medical History:  Diagnosis Date  . Anxiety   . Arthritis   . Coronary artery disease    a. NST 08/2016: high-risk w/ inferior and lateral wall ischemia  b. cath 09/2016: 80% mid-LAD and 90% distal LAD stenosis. Placement of 2 DES. Mild nonobstructive dz along RCA and LCx.  . Diabetes mellitus    A1C 6 weeks ago as of 09/08/14 was 6.2. Not on oral meds.  [name] GERD (gastroesophageal reflux disease)   . H/O cardiovascular stress test    ETT-Myoview 11/11: Inferior fixed defect likely due to diaphragmatic attenuation with normal wall motion, EF 69%  . H/O echocardiogram    Echo 11/11: EF 21-30%, grade 1 diastolic dysfunction, mild LAE  . H/O hiatal hernia   . Hyperlipidemia   . Hypertension   . Mini stroke ([place])   . Obesity   . Pneumonia    hx  . Sleep apnea    cpap 1 yr  . Stroke [facility]) 10    Past Surgical History:  Procedure Laterality Date  . BUNIONECTOMY    . CARDIAC CATHETERIZATION N/A 09/06/2016   Procedure: Left Heart Cath and Coronary Angiography;  Surgeon: Burnell Blanks, MD;   Location: [facility];  Service: Cardiovascular;  Laterality: N/A;  . CARDIAC CATHETERIZATION N/A 09/06/2016   Procedure: Coronary Stent Intervention;  Surgeon: Burnell Blanks, MD;  Location: [facility];  Service: Cardiovascular;  Laterality: N/A;  . COLONOSCOPY  2011  . CORONARY STENT PLACEMENT  09/06/2016   Severe stenosis mid LAD, s/p successful PTCA/DES x 1  . FOOT SURGERY Left    bunion  . Hydrocell Removal    . KNEE SURGERY Left    arthroscopy  . NASAL SEPTOPLASTY W/ TURBINOPLASTY N/A 09/08/2014   Procedure: NASAL SEPTOPLASTY WITH TURBINATE REDUCTION;  Surgeon: [name], MD;  Location: [facility];  Service: ENT;  Laterality: N/A;  . TONSILLECTOMY      Current Outpatient Medications  Medication Sig Dispense Refill  . aspirin 81 MG tablet Take 81 mg by mouth daily.      [name] atorvastatin (LIPITOR) 40 MG tablet TAKE ONE TABLET BY MOUTH ONCE DAILY AT  6  PM 30 tablet 6  . clopidogrel (PLAVIX) 75 MG tablet Take 1 tablet (75 mg total) by mouth daily. 90 tablet 3  . EPINEPHrine (EPIPEN) 0.3 mg/0.3 mL SOAJ injection Inject 0.3 mLs (0.3 mg total) into the muscle once. For signs or symptoms of anaphylaxis. 1 Device 0  . escitalopram (LEXAPRO) 10 MG tablet Take 10 mg by mouth daily.     [name]  irbesartan (AVAPRO) 75 MG tablet Take 1 tablet (75 mg total) by mouth daily. 90 tablet 3  . Multiple Vitamin (MULTIVITAMIN) tablet Take 1 tablet by mouth daily.      . nitroGLYCERIN (NITROSTAT) 0.4 MG SL tablet Place 1 tablet (0.4 mg total) under the tongue every 5 (five) minutes as needed for chest pain. 25 tablet 6  . OMEGA 3 1200 MG CAPS Take 1,200 mg by mouth daily.     . pantoprazole (PROTONIX) 40 MG tablet Take 40 mg by mouth 2 (two) times daily.    [name] triamterene-hydrochlorothiazide (DYAZIDE) 37.5-25 MG capsule Take 1 each (1 capsule total) by mouth daily. 90 capsule 3   No current facility-administered medications for this visit.    Allergies as of 08/02/2020 - Review Complete  02/01/2020  Allergen Reaction Noted  . Bee venom Anaphylaxis, Itching, and Swelling 02/20/2014  . Bee pollen  12/14/2017  . Hydrocodone-acetaminophen Rash 04/06/2010    Family History  Problem Relation Age of Onset  . Prostate cancer Father   . Colon cancer Father   . Heart attack Mother   . Heart disease Other        paternal grandparents  . Alzheimer's disease Maternal Grandmother   . Stomach cancer Neg Hx     Social History   Socioeconomic History  . Marital status: Married    Spouse name: Not on file  . Number of children: Not on file  . Years of education: Not on file  . Highest education level: Not on file  Occupational History  . Occupation: Drivers Ed Product manager: [employer]  Tobacco Use  . Smoking status: Former Smoker    Packs/day: 3.50    Years: 20.00    Pack years: 70.00    Types: Cigarettes    Quit date: 12/02/1984    Years since quitting: 35.6  . Smokeless tobacco: Never Used  Vaping Use  . Vaping Use: Never used  Substance and Sexual Activity  . Alcohol use: No    Comment: quit 85  . Drug use: No  . Sexual activity: Not on file  Other Topics Concern  . Not on file  Social History Narrative  . Not on file   Social Determinants of Health   Financial Resource Strain:   . Difficulty of Paying Living Expenses: Not on file  Food Insecurity:   . Worried About Charity fundraiser in the Last Year: Not on file  . Ran Out of Food in the Last Year: Not on file  Transportation Needs:   . Lack of Transportation (Medical): Not on file  . Lack of Transportation (Non-Medical): Not on file  Physical Activity:   . Days of Exercise per Week: Not on file  . Minutes of Exercise per Session: Not on file  Stress:   . Feeling of Stress : Not on file  Social Connections:   . Frequency of Communication with Friends and Family: Not on file  . Frequency of Social Gatherings with Friends and Family: Not on file  . Attends Religious Services: Not on  file  . Active Member of Clubs or Organizations: Not on file  . Attends Archivist Meetings: Not on file  . Marital Status: Not on file  Intimate Partner Violence:   . Fear of Current or Ex-Partner: Not on file  . Emotionally Abused: Not on file  . Physically Abused: Not on file  . Sexually Abused: Not on file  Review of Systems:    Constitutional: No weight loss, fever or chills Skin: No rash Cardiovascular: No chest pain Respiratory: No SOB  Gastrointestinal: See HPI and otherwise negative Genitourinary: No dysuria  Neurological: No headache, dizziness or syncope Musculoskeletal: No new muscle or joint pain Hematologic: No bleeding  Psychiatric: No history of depression or anxiety   Physical Exam:  Vital signs: BP 106/70 (BP Location: Left Arm, Patient Position: Sitting, Cuff Size: Normal)   Pulse 68   Ht 5' 11.5" (1.816 m) Comment: height measured without shoes  Wt 270 lb 2 oz (122.5 kg)   BMI 37.15 kg/m   Constitutional:   Pleasant Caucasian male appears to be in NAD, Well developed, Well nourished, alert and cooperative Head:  Normocephalic and atraumatic. Eyes:   PEERL, EOMI. No icterus. Conjunctiva pink. Ears:  Normal auditory acuity. Neck:  Supple Throat: Oral cavity and pharynx without inflammation, swelling or lesion.  Respiratory: Respirations even and unlabored. Lungs clear to auscultation bilaterally.   No wheezes, crackles, or rhonchi.  Cardiovascular: Normal S1, S2. No MRG. Regular rate and rhythm. No peripheral edema, cyanosis or pallor.  Gastrointestinal:  Soft, nondistended, nontender. No rebound or guarding. Normal bowel sounds. No appreciable masses or hepatomegaly. Rectal:  Not performed.  Msk:  Symmetrical without gross deformities. Without edema, no deformity or joint abnormality.  Neurologic:  Alert and  oriented x4;  grossly normal neurologically.  Skin:   Dry and intact without significant lesions or rashes. Psychiatric: Demonstrates  good judgement and reason without abnormal affect or behaviors.  No recent labs or imaging.  Assessment: 1.  History of adenomatous polyps: Last colonoscopy 06/08/2015, repeat recommended in 5 years 2.  Chronic anticoagulation for CAD: On Plavix  Plan: 1.  Patient is already scheduled for colonoscopy in the [place] with Dr. Henrene Pastor on September 23.  Did review risks, benefits, limitations and alternatives and patient agrees to proceed.  He has had his Covid vaccines. 2.  Advised the patient hold his Plavix for 5 days prior to time of procedure.  We will communicate with his prescribing physician to ensure that holding his Plavix is acceptable for him. 3.  Patient follow in clinic per recommendations from Dr. Henrene Pastor after time of procedure.  Ellouise Newer, PA-C [facility] 08/02/2020, 1:18 PM  Cc: Burnard Bunting, MD

## 2020-08-02 NOTE — Patient Instructions (Addendum)
If you are age 70 or older, your body mass index should be between 23-30. Your Body mass index is 37.15 kg/m. If this is out of the aforementioned range listed, please consider follow up with your Primary Care Provider.  If you are age 3 or younger, your body mass index should be between 19-25. Your Body mass index is 37.15 kg/m. If this is out of the aformentioned range listed, please consider follow up with your Primary Care Provider.   You will be contacted by our office prior to your procedure for directions on holding your Plavix.  If you do not hear from our office 1 week prior to your scheduled procedure, please call [phone] to discuss.   You have been scheduled for a colonoscopy. Please follow written instructions given to you at your visit today.  Please pick up your prep supplies at the pharmacy within the next 1-3 days. If you use inhalers (even only as needed), please bring them with you on the day of your procedure.  Due to recent changes in healthcare laws, you may see the results of your imaging and laboratory studies on MyChart before your provider has had a chance to review them.  We understand that in some cases there may be results that are confusing or concerning to you. Not all laboratory results come back in the same time frame and the provider may be waiting for multiple results in order to interpret others.  Please give [place] 48 hours in order for your provider to thoroughly review all the results before contacting the office for clarification of your results.

## 2020-08-02 NOTE — Telephone Encounter (Signed)
Informed patient's wife of change in prep to Plenvu and new instructions sent to patient via mychart. Patient's wife states she will print them off for the patient.

## 2020-08-02 NOTE — Telephone Encounter (Signed)
Switched patient to Plenvu.

## 2020-08-02 NOTE — Progress Notes (Signed)
Assessment and outlined plans noted

## 2020-08-02 NOTE — Telephone Encounter (Signed)
Matthew Love from [facility] is requesting a call back from a nurse, caller states the [place] interacts with the pt's DYAZIDE.  CB [phone]

## 2020-08-04 ENCOUNTER — Telehealth: Payer: Self-pay | Admitting: Physician Assistant

## 2020-08-04 NOTE — Telephone Encounter (Signed)
Matthew Love from [place] is wanting to discuss the drug interaction between [place] and [place] [phone]

## 2020-08-04 NOTE — Telephone Encounter (Signed)
Informed Levada Dy we switched patient prep to Plenvu.

## 2020-08-18 DIAGNOSIS — Z23 Encounter for immunization: Secondary | ICD-10-CM | POA: Diagnosis not present

## 2020-08-21 ENCOUNTER — Telehealth: Payer: Self-pay

## 2020-08-21 NOTE — Telephone Encounter (Signed)
Check with Anderson Malta (who saw the patient) or the nurse that was working with her. I can do the case either way. Thanks

## 2020-08-21 NOTE — Telephone Encounter (Signed)
Pt scheduled for colonoscopy 9/23. Is there a medical clearance for his Plavix? I see where he was told to hold for five days, but it also stated they speak with his physician.

## 2020-08-22 ENCOUNTER — Telehealth: Payer: Self-pay | Admitting: *Deleted

## 2020-08-22 NOTE — Telephone Encounter (Signed)
Pt may hold plavix for 5-7 days prior to procedure   Liam Rogers, MD

## 2020-08-22 NOTE — Telephone Encounter (Signed)
   Primary Cardiologist: Mertie Moores, MD  Chart reviewed and I spoke with Ms Potts on the phone.today as part of pre-operative protocol coverage. Given past medical history and time since last visit, based on ACC/AHA guidelines, TAURUS ALAMO would be at acceptable risk for the planned procedure without further cardiovascular testing.   Ok to hold Plavix 5 days pre op if needed.   The patient was advised that if he develops new symptoms prior to surgery to contact our office to arrange for a follow-up visit, and he verbalized understanding.  I will route this recommendation to the requesting party via Epic fax function and remove from pre-op pool.  Please call with questions.  Kerin Ransom, PA-C 08/22/2020, 4:21 PM

## 2020-08-22 NOTE — Telephone Encounter (Signed)
Spoke with patient's wife and he did not stop Plavix. Reschedule colonoscopy for 9/30 at 10:30 am. Sent clearance request to cardiology.

## 2020-08-22 NOTE — Telephone Encounter (Signed)
Not sure if you were working with me??  But can you track down this prior auth?  Thanks-JLL

## 2020-08-22 NOTE — Telephone Encounter (Signed)
CMA's, please be more compulsive following up on anticoagulant or antiplatelet issues. This resulted in a lost patient care opportunity in the [facility]. Thanks

## 2020-08-22 NOTE — Telephone Encounter (Signed)
Dr Acie Fredrickson please comment on holding Plavix for 5 days for colonoscopy and then I will contact the patient.  The patient had an LAD DES in 2017, Myoview low risk March 2021.   Please respond to CV PRE OP  Thanks  Kerin Ransom PA-C 08/22/2020 10:28 AM

## 2020-08-22 NOTE — Telephone Encounter (Signed)
[facility] Pre-operative Risk Assessment     Request for surgical clearance:     Endoscopy Procedure  What type of surgery is being performed?     Colonoscopy  When is this surgery scheduled?     Thursday 08/31/20  What type of clearance is required ?   Pharmacy  Are there any medications that need to be held prior to surgery and how long? Plavix 5 days  Practice name and name of physician performing surgery?      [facility]  What is your office phone and fax number?      Phone- [phone]  [phone]  Anesthesia type (None, local, MAC, general) ?       MAC

## 2020-08-24 ENCOUNTER — Encounter: Payer: Medicare Other | Admitting: Internal Medicine

## 2020-08-31 ENCOUNTER — Encounter: Payer: Self-pay | Admitting: Internal Medicine

## 2020-08-31 ENCOUNTER — Ambulatory Visit (AMBULATORY_SURGERY_CENTER): Payer: Medicare Other | Admitting: Internal Medicine

## 2020-08-31 ENCOUNTER — Other Ambulatory Visit: Payer: Self-pay

## 2020-08-31 VITALS — BP 142/79 | HR 62 | Temp 96.6°F | Resp 13 | Ht 71.0 in | Wt 270.0 lb

## 2020-08-31 DIAGNOSIS — D122 Benign neoplasm of ascending colon: Secondary | ICD-10-CM

## 2020-08-31 DIAGNOSIS — Z86010 Personal history of colonic polyps: Secondary | ICD-10-CM

## 2020-08-31 DIAGNOSIS — K635 Polyp of colon: Secondary | ICD-10-CM | POA: Diagnosis not present

## 2020-08-31 MED ORDER — SODIUM CHLORIDE 0.9 % IV SOLN: 500.0000 mL | Freq: Once | INTRAVENOUS | Status: DC

## 2020-08-31 NOTE — Progress Notes (Signed)
Called to room to assist during endoscopic procedure.  Patient ID and intended procedure confirmed with present staff. Received instructions for my participation in the procedure from the performing physician.  

## 2020-08-31 NOTE — Patient Instructions (Signed)
Handouts provided on polyps, diverticulosis and hemorrhoids.  Resume Plavix (clopidogrel) today at prior dose.   YOU HAD AN ENDOSCOPIC PROCEDURE TODAY AT [facility]:   Refer to the procedure report that was given to you for any specific questions about what was found during the examination.  If the procedure report does not answer your questions, please call your gastroenterologist to clarify.  If you requested that your care partner not be given the details of your procedure findings, then the procedure report has been included in a sealed envelope for you to review at your convenience later.  YOU SHOULD EXPECT: Some feelings of bloating in the abdomen. Passage of more gas than usual.  Walking can help get rid of the air that was put into your GI tract during the procedure and reduce the bloating. If you had a lower endoscopy (such as a colonoscopy or flexible sigmoidoscopy) you may notice spotting of blood in your stool or on the toilet paper. If you underwent a bowel prep for your procedure, you may not have a normal bowel movement for a few days.  Please Note:  You might notice some irritation and congestion in your nose or some drainage.  This is from the oxygen used during your procedure.  There is no need for concern and it should clear up in a day or so.  SYMPTOMS TO REPORT IMMEDIATELY:   Following lower endoscopy (colonoscopy or flexible sigmoidoscopy):  Excessive amounts of blood in the stool  Significant tenderness or worsening of abdominal pains  Swelling of the abdomen that is new, acute  Fever of 100F or higher  For urgent or emergent issues, a gastroenterologist can be reached at any hour by calling [phone]. Do not use MyChart messaging for urgent concerns.    DIET:  We do recommend a small meal at first, but then you may proceed to your regular diet.  Drink plenty of fluids but you should avoid alcoholic beverages for 24 hours.  ACTIVITY:  You should  plan to take it easy for the rest of today and you should NOT DRIVE or use heavy machinery until tomorrow (because of the sedation medicines used during the test).    FOLLOW UP: Our staff will call the number listed on your records 48-72 hours following your procedure to check on you and address any questions or concerns that you may have regarding the information given to you following your procedure. If we do not reach you, we will leave a message.  We will attempt to reach you two times.  During this call, we will ask if you have developed any symptoms of COVID 19. If you develop any symptoms (ie: fever, flu-like symptoms, shortness of breath, cough etc.) before then, please call [phone].  If you test positive for Covid 19 in the 2 weeks post procedure, please call and report this information to [place].    If any biopsies were taken you will be contacted by phone or by letter within the next 1-3 weeks.  Please call us at [phone] if you have not heard about the biopsies in 3 weeks.    SIGNATURES/CONFIDENTIALITY: You and/or your care partner have signed paperwork which will be entered into your electronic medical record.  These signatures attest to the fact that that the information above on your After Visit Summary has been reviewed and is understood.  Full responsibility of the confidentiality of this discharge information lies with you and/or your care-partner.

## 2020-08-31 NOTE — Progress Notes (Signed)
Pt's states no medical or surgical changes since previsit or office visit. 

## 2020-08-31 NOTE — Progress Notes (Signed)
PT taken to PACU. Monitors in place. VSS. Report given to RN. 

## 2020-08-31 NOTE — Op Note (Signed)
[facility] Patient Name: Matthew Love Procedure Date: 08/31/2020 10:31 AM MRN: [id number] Endoscopist: Docia Chuck. Henrene Pastor , MD Age: 69 Referring MD:  Date of Birth: 1951/11/08 Gender: Male Account #: [id number] Procedure:                Colonoscopy with cold snare polypectomy x 1 Indications:              High risk colon cancer surveillance: Personal                            history of non-advanced adenomas. Previous                            examinations 2004, 2011, 2016 Medicines:                Monitored Anesthesia Care Procedure:                Pre-Anesthesia Assessment:                           - Prior to the procedure, a History and Physical                            was performed, and patient medications and                            allergies were reviewed. The patient's tolerance of                            previous anesthesia was also reviewed. The risks                            and benefits of the procedure and the sedation                            options and risks were discussed with the patient.                            All questions were answered, and informed consent                            was obtained. Prior Anticoagulants: The patient has                            taken Plavix (clopidogrel), last dose was 6 days                            prior to procedure. ASA Grade Assessment: III - A                            patient with severe systemic disease. After                            reviewing the risks and benefits, the patient was  deemed in satisfactory condition to undergo the                            procedure.                           After obtaining informed consent, the colonoscope                            was passed under direct vision. Throughout the                            procedure, the patient's blood pressure, pulse, and                            oxygen saturations were monitored  continuously. The                            Colonoscope was introduced through the anus and                            advanced to the the cecum, identified by                            appendiceal orifice and ileocecal valve. The                            ileocecal valve, appendiceal orifice, and rectum                            were photographed. The quality of the bowel                            preparation was excellent. The colonoscopy was                            performed without difficulty. The patient tolerated                            the procedure well. The bowel preparation used was                            SUPREP via split dose instruction. Scope In: 10:47:47 AM Scope Out: 10:59:55 AM Scope Withdrawal Time: 0 hours 9 minutes 14 seconds  Total Procedure Duration: 0 hours 12 minutes 8 seconds  Findings:                 A 5 mm polyp was found in the ascending colon. The                            polyp was sessile. The polyp was removed with a                            cold snare. Resection and retrieval were complete.  Multiple diverticula were found in the sigmoid                            colon.                           Internal hemorrhoids were found during                            retroflexion. The hemorrhoids were moderate.                           The exam was otherwise without abnormality on                            direct and retroflexion views. Complications:            No immediate complications. Estimated blood loss:                            None. Estimated Blood Loss:     Estimated blood loss: none. Impression:               - One 5 mm polyp in the ascending colon, removed                            with a cold snare. Resected and retrieved.                           - Diverticulosis in the sigmoid colon.                           - Internal hemorrhoids.                           - The examination was otherwise normal  on direct                            and retroflexion views. Recommendation:           - Repeat colonoscopy in 5 years for surveillance.                           - Resume Plavix (clopidogrel) today at prior dose.                           - Patient has a contact number available for                            emergencies. The signs and symptoms of potential                            delayed complications were discussed with the                            patient. Return to normal activities tomorrow.  Written discharge instructions were provided to the                            patient.                           - Resume previous diet.                           - Continue present medications.                           - Await pathology results. Docia Chuck. Henrene Pastor, MD 08/31/2020 11:04:21 AM This report has been signed electronically.

## 2020-08-31 NOTE — Progress Notes (Signed)
C.W. vital signs. 

## 2020-09-04 ENCOUNTER — Telehealth: Payer: Self-pay

## 2020-09-04 NOTE — Telephone Encounter (Signed)
°  Follow up Call-  Call back number 08/31/2020  Post procedure Call Back phone  # [phone]  Permission to leave phone message Yes  Some recent data might be hidden     Patient questions:  Do you have a fever, pain , or abdominal swelling? No. Pain Score  0 *  Have you tolerated food without any problems? Yes.    Have you been able to return to your normal activities? Yes.    Do you have any questions about your discharge instructions: Diet   No. Medications  No. Follow up visit  No.  Do you have questions or concerns about your Care? No.  Actions: * If pain score is 4 or above: No action needed, pain <4.  1. Have you developed a fever since your procedure? no  2.   Have you had an respiratory symptoms (SOB or cough) since your procedure? no  3.   Have you tested positive for COVID 19 since your procedure no  4.   Have you had any family members/close contacts diagnosed with the COVID 19 since your procedure?  no   If yes to any of these questions please route to Joylene John, RN and Joella Prince, RN

## 2020-09-05 ENCOUNTER — Encounter: Payer: Self-pay | Admitting: Internal Medicine

## 2020-10-25 ENCOUNTER — Other Ambulatory Visit: Payer: Self-pay | Admitting: Cardiovascular Disease

## 2021-01-04 ENCOUNTER — Other Ambulatory Visit: Payer: Self-pay | Admitting: Cardiovascular Disease

## 2021-01-14 ENCOUNTER — Encounter: Payer: Self-pay | Admitting: Cardiovascular Disease

## 2021-01-14 NOTE — Progress Notes (Unsigned)
[place], [place]  [address] Phone: [phone] Fax:  [phone]  Date:  01/15/2021   Name:  Matthew Love   DOB:  1951/01/29   MRN:  [id number]  PCP:  Burnard Bunting, MD  Primary Cardiologist:  Dr. Liam Rogers  Primary Electrophysiologist:  None    Notes prior to 2013:  Matthew Love is a 70 y.o. male who returns for evaluation of chest pain.  He has a history of HTN, DM2, HL, GERD and sleep apnea. He is noncompliant with CPAP.   Echo 11/11: EF 32-91%, grade 1 diastolic dysfunction, mild LAE.  ETT-Myoview 11/11: Inferior fixed defect likely due to diaphragmatic attenuation with normal wall motion, EF 69%.   Over the last 2-3 months, he has noted worsening left-sided chest pain. He can point to it with one finger. It feels dull. He denies any exertional chest pain. He notes it only at rest. He denies any radiating symptoms or associated nausea, diaphoresis. He denies associated shortness of breath. He does have dyspnea with more extreme activities. He denies orthopnea, PND or significant pedal edema. He has had a cough over last several months. This is productive of clear sputum. He was prescribed a Z-Pak at one point with his primary care provider. He denies any recent travels, injuries to his lower extremities or recent hospitalizations.  He denies pleuritic chest pain or chest pain with lying supine.  Sept. 17, 2013 -   He was seen by [name] in August for chest pain. A stress Myoview study was normal. He increase his Nexium and the mild chest discomfort improved.  Sept. 19, 2016:  Doing well  . Retired from [employer]   Exercising  Some .   Walking 30 minutes every AM.   His foot and toes on left side get very cold   Sept. 21, 2017:  Matthew Love is seen today for a work in visit Had some chest pain  Center of chest.    Seemed to be worse when he put his hand on his chest  Awoke last night with some left sided chest pain   Usually occurs when he is lying down. Walking 20 minutes a day -  Has gained about 10 lbs over the past month    Nov. 20, 2017:    Matthew Love is seen back .  He had stenting of his proximal/mid LAD and stenting of his distal  in October, 2017. [name] Since that time he's had some lightheadedness / dizziness. Had a GI bug this past weekend.   Had some diarrhea and poor appetite.   He has improved his diet. Has lost weight - 25-30 lbs since his the stent procedure .  Is avoiding salt.    Feb. 26, 2018:  Has lost another 40 lbs. Is watching his diet  No CP .   Oct. 1, 2018:    Doing well.   No angina ,  Has MSK aches ,  No pain similar to her previous angina   , no dyspnea.  Not exercising as much.  Having some back issues.  January 09, 2018:  Matthew Love is doing well  No CP  Has not been sleeping well Has been having some chest pain  Has lots of dizziness.   Wife [name] thinks it might be due to Imdur  Wants to start walking more  Retired from teaching   Apr 16, 2018:   No further episodes of CP  Walks 30 minutes  5 days a week.   Has been gaining weight  - 7 lbs since lst Nov. 2018  Nov. 13, 2019: No   dyspnea Had 1 episode of dizziness while walking  Had an episode of CP in Sept - went to the ER Work up was negative.  Has not recurred.  Found nothing wrong Has gained weight.  Knows he has not been eating right .   exercising regularly.     Feb. 12, 2021: Matthew Love is seen today for a work in visit because of some episodes of chest discomfort. Does not know if these are similar to his previous episodes  He has a history of coronary artery disease.  He had a stent placed to the proximal to mid LAD in October, 2017.  He also had a distal LAD stent at that same time. Has occasional CP ,  Better after he took several aspirin   Is having some right leg pain   Worse after he has been sitting down .   Is not worsened with exertion.   Sounds more like radicular pain .     Feb. 14,  2022: Matthew Love is seen today for follow up visit for his CAD No angina .   Has some DOE ,  Has stopped walking and realizes that he needs to get back into exercising  Wt. Is 267 lbs   Wt Readings from Last 3 Encounters:  01/15/21 267 lb 12.8 oz (121.5 kg)  08/31/20 270 lb (122.5 kg)  08/02/20 270 lb 2 oz (122.5 kg)     Past Medical History:  Diagnosis Date  . Anxiety   . Arthritis   . Coronary artery disease    a. NST 08/2016: high-risk w/ inferior and lateral wall ischemia  b. cath 09/2016: 80% mid-LAD and 90% distal LAD stenosis. Placement of 2 DES. Mild nonobstructive dz along RCA and LCx.  . Diabetes mellitus    A1C 6 weeks ago as of 09/08/14 was 6.2. Not on oral meds.  [name] GERD (gastroesophageal reflux disease)   . H/O cardiovascular stress test    ETT-Myoview 11/11: Inferior fixed defect likely due to diaphragmatic attenuation with normal wall motion, EF 69%  . H/O echocardiogram    Echo 11/11: EF 16-10%, grade 1 diastolic dysfunction, mild LAE  . H/O hiatal hernia   . Hyperlipidemia   . Hypertension   . Mini stroke ([place])   . Obesity   . Pneumonia    hx  . Sleep apnea    cpap 1 yr  . Stroke [facility]) 10    Current Outpatient Medications  Medication Sig Dispense Refill  . aspirin 81 MG tablet Take 81 mg by mouth daily.    [name] atorvastatin (LIPITOR) 40 MG tablet TAKE ONE TABLET BY MOUTH ONCE DAILY AT  6  PM 30 tablet 6  . clopidogrel (PLAVIX) 75 MG tablet Take 1 tablet (75 mg total) by mouth daily. 90 tablet 3  . EPINEPHrine (EPIPEN) 0.3 mg/0.3 mL SOAJ injection Inject 0.3 mLs (0.3 mg total) into the muscle once. For signs or symptoms of anaphylaxis. 1 Device 0  . escitalopram (LEXAPRO) 10 MG tablet Take 10 mg by mouth daily.     . Multiple Vitamin (MULTIVITAMIN) tablet Take 1 tablet by mouth daily.    . nitroGLYCERIN (NITROSTAT) 0.4 MG SL tablet Place 1 tablet (0.4 mg total) under the tongue every 5 (five) minutes as needed for chest pain. 25 tablet 6  . OMEGA 3 1200 MG CAPS  Take  1,200 mg by mouth daily.    . pantoprazole (PROTONIX) 40 MG tablet Take 40 mg by mouth 2 (two) times daily.    [name] triamterene-hydrochlorothiazide (DYAZIDE) 37.5-25 MG capsule Take 1 each (1 capsule total) by mouth daily. Please keep upcoming appt in February 2022 with Dr. Acie Fredrickson before anymore refills. Thank you 90 capsule 0  . irbesartan (AVAPRO) 75 MG tablet Take 1 tablet (75 mg total) by mouth daily. 90 tablet 3   No current facility-administered medications for this visit.    Allergies: Allergies  Allergen Reactions  . Bee Venom Anaphylaxis, Itching and Swelling  . Bee Pollen   . Hydrocodone-Acetaminophen Rash    Social History   Tobacco Use  . Smoking status: Former Smoker    Packs/day: 3.50    Years: 20.00    Pack years: 70.00    Types: Cigarettes    Quit date: 12/02/1984    Years since quitting: 36.1  . Smokeless tobacco: Never Used  Vaping Use  . Vaping Use: Never used  Substance Use Topics  . Alcohol use: No    Comment: quit 85  . Drug use: No     ROS:  Please see the history of present illness.     All other systems reviewed and negative.     Physical Exam: Blood pressure 118/68, pulse 65, height 6' 2"  (1.88 m), weight 267 lb 12.8 oz (121.5 kg), SpO2 98 %.  GEN:  Well nourished, well developed in no acute distress HEENT: Normal NECK: No JVD; No carotid bruits LYMPHATICS: No lymphadenopathy CARDIAC: RRR , no murmurs, rubs, gallops RESPIRATORY:  Clear to auscultation without rales, wheezing or rhonchi  ABDOMEN: Soft, non-tender, non-distended MUSCULOSKELETAL:  No edema; No deformity  SKIN: Warm and dry NEUROLOGIC:  Alert and oriented x 3    EKG:        January 14, 2020: Sinus bradycardia 59.  Nonspecific ST and T wave changes.  ASSESSMENT AND PLAN:  1.    CAD :     S/p stenting of prox / mid LAD and distal LAD in OCt. 2017 No angina .    Advice him to exercise more    2.   Hypertension:    BP is well controlled.  Continue weight loss   4.   Hyperlipidemia: -  Lipids have been well controlled.   Check lipids today      Mertie Moores, MD  01/15/2021 8:46 AM    [facility] [address],  [address] [place], [place]  [address] Pager [phone] Phone: [phone]; Fax: [phone]

## 2021-01-15 ENCOUNTER — Ambulatory Visit (INDEPENDENT_AMBULATORY_CARE_PROVIDER_SITE_OTHER): Payer: Medicare Other | Admitting: Cardiovascular Disease

## 2021-01-15 ENCOUNTER — Other Ambulatory Visit: Payer: Self-pay

## 2021-01-15 ENCOUNTER — Encounter: Payer: Self-pay | Admitting: Cardiovascular Disease

## 2021-01-15 VITALS — BP 118/68 | HR 65 | Ht 74.0 in | Wt 267.8 lb

## 2021-01-15 DIAGNOSIS — I25118 Atherosclerotic heart disease of native coronary artery with other forms of angina pectoris: Secondary | ICD-10-CM | POA: Diagnosis not present

## 2021-01-15 DIAGNOSIS — E782 Mixed hyperlipidemia: Secondary | ICD-10-CM

## 2021-01-15 LAB — BASIC METABOLIC PANEL
BUN/Creatinine Ratio: 19 (ref 10–24)
BUN: 19 mg/dL (ref 8–27)
CO2: 25 mmol/L (ref 20–29)
Calcium: 9.8 mg/dL (ref 8.6–10.2)
Chloride: 102 mmol/L (ref 96–106)
Creatinine, Ser: 1 mg/dL (ref 0.76–1.27)
GFR calc Af Amer: 88 mL/min/{1.73_m2} (ref >59)
GFR calc non Af Amer: 76 mL/min/{1.73_m2} (ref >59)
Glucose: 106 mg/dL — ABNORMAL HIGH (ref 65–99)
Potassium: 4 mmol/L (ref 3.5–5.2)
Sodium: 141 mmol/L (ref 134–144)

## 2021-01-15 LAB — ALT: ALT: 16 IU/L (ref 0–44)

## 2021-01-15 LAB — LIPID PANEL
Chol/HDL Ratio: 2.8 ratio (ref 0.0–5.0)
Cholesterol, Total: 129 mg/dL (ref 100–199)
HDL: 46 mg/dL (ref >39)
LDL Chol Calc (NIH): 58 mg/dL (ref 0–99)
Triglycerides: 144 mg/dL (ref 0–149)
VLDL Cholesterol Cal: 25 mg/dL (ref 5–40)

## 2021-01-15 MED ORDER — IRBESARTAN 75 MG PO TABS
75.0000 mg | ORAL_TABLET | Freq: Every day | ORAL | 3 refills | Status: DC
Start: 2021-01-15 — End: 2021-04-10

## 2021-01-15 NOTE — Patient Instructions (Signed)
Medication Instructions:  Your physician recommends that you continue on your current medications as directed. Please refer to the Current Medication list given to you today.  *If you need a refill on your cardiac medications before your next appointment, please call your pharmacy*   Lab Work: TODAY: LIPIDS, LIVER, BMET If you have labs (blood work) drawn today and your tests are completely normal, you will receive your results only by: [name] MyChart Message (if you have MyChart) OR . A paper copy in the mail If you have any lab test that is abnormal or we need to change your treatment, we will call you to review the results.   Testing/Procedures: none   Follow-Up: At [facility], you and your health needs are our priority.  As part of our continuing mission to provide you with exceptional heart care, we have created designated Provider Care Teams.  These Care Teams include your primary Cardiologist (physician) and Advanced Practice Providers (APPs -  Physician Assistants and Nurse Practitioners) who all work together to provide you with the care you need, when you need it.  Your next appointment:   1 year(s)  The format for your next appointment:   In Person  Provider:   You may see Mertie Moores, MD or one of the following Advanced Practice Providers on your designated Care Team:    [name], PA-C  [place], [place]

## 2021-01-22 ENCOUNTER — Other Ambulatory Visit: Payer: Self-pay | Admitting: Cardiovascular Disease

## 2021-04-02 DIAGNOSIS — Z125 Encounter for screening for malignant neoplasm of prostate: Secondary | ICD-10-CM | POA: Diagnosis not present

## 2021-04-02 DIAGNOSIS — E785 Hyperlipidemia, unspecified: Secondary | ICD-10-CM | POA: Diagnosis not present

## 2021-04-02 DIAGNOSIS — R7302 Impaired glucose tolerance (oral): Secondary | ICD-10-CM | POA: Diagnosis not present

## 2021-04-04 DIAGNOSIS — Z1339 Encounter for screening examination for other mental health and behavioral disorders: Secondary | ICD-10-CM | POA: Diagnosis not present

## 2021-04-04 DIAGNOSIS — K219 Gastro-esophageal reflux disease without esophagitis: Secondary | ICD-10-CM | POA: Diagnosis not present

## 2021-04-04 DIAGNOSIS — F4321 Adjustment disorder with depressed mood: Secondary | ICD-10-CM | POA: Diagnosis not present

## 2021-04-04 DIAGNOSIS — I10 Essential (primary) hypertension: Secondary | ICD-10-CM | POA: Diagnosis not present

## 2021-04-04 DIAGNOSIS — Z0000 Encounter for general adult medical examination without abnormal findings: Secondary | ICD-10-CM | POA: Diagnosis not present

## 2021-04-04 DIAGNOSIS — F418 Other specified anxiety disorders: Secondary | ICD-10-CM | POA: Diagnosis not present

## 2021-04-04 DIAGNOSIS — Z1331 Encounter for screening for depression: Secondary | ICD-10-CM | POA: Diagnosis not present

## 2021-04-04 DIAGNOSIS — E785 Hyperlipidemia, unspecified: Secondary | ICD-10-CM | POA: Diagnosis not present

## 2021-04-04 DIAGNOSIS — R3912 Poor urinary stream: Secondary | ICD-10-CM | POA: Diagnosis not present

## 2021-04-04 DIAGNOSIS — E1169 Type 2 diabetes mellitus with other specified complication: Secondary | ICD-10-CM | POA: Diagnosis not present

## 2021-04-04 DIAGNOSIS — G4730 Sleep apnea, unspecified: Secondary | ICD-10-CM | POA: Diagnosis not present

## 2021-04-10 ENCOUNTER — Other Ambulatory Visit: Payer: Self-pay

## 2021-04-10 MED ORDER — TRIAMTERENE-HCTZ 37.5-25 MG PO CAPS: 1.0000 | ORAL_CAPSULE | Freq: Every day | ORAL | 3 refills | Status: DC

## 2021-04-10 MED ORDER — IRBESARTAN 75 MG PO TABS: 75.0000 mg | ORAL_TABLET | Freq: Every day | ORAL | 3 refills | Status: DC

## 2021-07-25 DIAGNOSIS — L820 Inflamed seborrheic keratosis: Secondary | ICD-10-CM | POA: Diagnosis not present

## 2021-07-25 DIAGNOSIS — L814 Other melanin hyperpigmentation: Secondary | ICD-10-CM | POA: Diagnosis not present

## 2021-07-25 DIAGNOSIS — L905 Scar conditions and fibrosis of skin: Secondary | ICD-10-CM | POA: Diagnosis not present

## 2021-07-25 DIAGNOSIS — D692 Other nonthrombocytopenic purpura: Secondary | ICD-10-CM | POA: Diagnosis not present

## 2021-07-25 DIAGNOSIS — L570 Actinic keratosis: Secondary | ICD-10-CM | POA: Diagnosis not present

## 2021-07-25 DIAGNOSIS — L821 Other seborrheic keratosis: Secondary | ICD-10-CM | POA: Diagnosis not present

## 2021-07-25 DIAGNOSIS — D225 Melanocytic nevi of trunk: Secondary | ICD-10-CM | POA: Diagnosis not present

## 2021-07-25 DIAGNOSIS — Z85828 Personal history of other malignant neoplasm of skin: Secondary | ICD-10-CM | POA: Diagnosis not present

## 2021-07-25 DIAGNOSIS — L578 Other skin changes due to chronic exposure to nonionizing radiation: Secondary | ICD-10-CM | POA: Diagnosis not present

## 2021-09-02 DIAGNOSIS — Z23 Encounter for immunization: Secondary | ICD-10-CM | POA: Diagnosis not present

## 2021-09-26 ENCOUNTER — Other Ambulatory Visit: Payer: Self-pay

## 2021-09-26 ENCOUNTER — Emergency Department (HOSPITAL_COMMUNITY): Payer: Medicare Other

## 2021-09-26 ENCOUNTER — Encounter (HOSPITAL_COMMUNITY): Payer: Self-pay | Admitting: *Deleted

## 2021-09-26 ENCOUNTER — Telehealth: Payer: Self-pay | Admitting: Cardiovascular Disease

## 2021-09-26 ENCOUNTER — Emergency Department (HOSPITAL_COMMUNITY)
Admission: EM | Admit: 2021-09-26 | Discharge: 2021-09-26 | Disposition: A | Discharge status: Home / Self Care | Payer: Medicare Other | Attending: Emergency Medicine | Admitting: Emergency Medicine

## 2021-09-26 DIAGNOSIS — R0789 Other chest pain: Secondary | ICD-10-CM | POA: Diagnosis not present

## 2021-09-26 DIAGNOSIS — Z87891 Personal history of nicotine dependence: Secondary | ICD-10-CM | POA: Diagnosis not present

## 2021-09-26 DIAGNOSIS — I10 Essential (primary) hypertension: Secondary | ICD-10-CM | POA: Diagnosis not present

## 2021-09-26 DIAGNOSIS — E119 Type 2 diabetes mellitus without complications: Secondary | ICD-10-CM | POA: Diagnosis not present

## 2021-09-26 DIAGNOSIS — R079 Chest pain, unspecified: Secondary | ICD-10-CM | POA: Diagnosis not present

## 2021-09-26 LAB — CBC
HCT: 46.3 % (ref 39.0–52.0)
Hemoglobin: 15.7 g/dL (ref 13.0–17.0)
MCH: 29.2 pg (ref 26.0–34.0)
MCHC: 33.9 g/dL (ref 30.0–36.0)
MCV: 86.1 fL (ref 80.0–100.0)
Platelets: 259 10*3/uL (ref 150–400)
RBC: 5.38 MIL/uL (ref 4.22–5.81)
RDW: 13.2 % (ref 11.5–15.5)
WBC: 7.3 10*3/uL (ref 4.0–10.5)
nRBC: 0 % (ref 0.0–0.2)

## 2021-09-26 LAB — BASIC METABOLIC PANEL
Anion gap: 12 (ref 5–15)
BUN: 18 mg/dL (ref 8–23)
CO2: 24 mmol/L (ref 22–32)
Calcium: 9.5 mg/dL (ref 8.9–10.3)
Chloride: 102 mmol/L (ref 98–111)
Creatinine, Ser: 1.01 mg/dL (ref 0.61–1.24)
GFR, Estimated: >60 mL/min (ref >60)
Glucose, Bld: 112 mg/dL — ABNORMAL HIGH (ref 70–99)
Potassium: 3.6 mmol/L (ref 3.5–5.1)
Sodium: 138 mmol/L (ref 135–145)

## 2021-09-26 LAB — TROPONIN I (HIGH SENSITIVITY)
Troponin I (High Sensitivity): 7 ng/L (ref <18)
Troponin I (High Sensitivity): 6 ng/L (ref <18)

## 2021-09-26 NOTE — ED Triage Notes (Signed)
Pt reports several episodes of left side chest pain last night and this morning. Describes pain as sharp and stabbing. Denies sob or n/v. No acute distress noted at triage.

## 2021-09-26 NOTE — ED Provider Notes (Signed)
[facility] EMERGENCY DEPARTMENT Provider Note   CSN: [id number] Arrival date & time: 09/26/21  7824     History Chief Complaint  Patient presents with   Chest Pain    Matthew Love is a 70 y.o. male.  HPI  Patient with history of diabetes, CAD, hyperlipidemia, S/p stenting of prox / mid LAD and distal LAD in OCt. 2017 presents with chest pain.  It started acutely yesterday, its been intermittent since then.  The pain feels sharp, on the left side of his chest and does not radiate.  There is no associated nausea, vomiting, shortness of breath.  The pain is not worsened by inspiration or movement, its not reproducible.  Patient reports he was using a power washer this week and thinks that could be contributing to the pain.  He took 2 aspirins this morning, has not tried any nitroglycerin.  States that the pain is intermittent and only last for seconds, at worst is a 4 out of 10.  Last cardiology visit with Dr. Mertie Moores in February 2022.   Past Medical History:  Diagnosis Date   Anxiety    Arthritis    Coronary artery disease    a. NST 08/2016: high-risk w/ inferior and lateral wall ischemia  b. cath 09/2016: 80% mid-LAD and 90% distal LAD stenosis. Placement of 2 DES. Mild nonobstructive dz along RCA and LCx.   Diabetes mellitus    A1C 6 weeks ago as of 09/08/14 was 6.2. Not on oral meds.   GERD (gastroesophageal reflux disease)    H/O cardiovascular stress test    ETT-Myoview 11/11: Inferior fixed defect likely due to diaphragmatic attenuation with normal wall motion, EF 69%   H/O echocardiogram    Echo 11/11: EF 23-53%, grade 1 diastolic dysfunction, mild LAE   H/O hiatal hernia    Hyperlipidemia    Hypertension    Mini stroke    Obesity    Pneumonia    hx   Sleep apnea    cpap 1 yr   Stroke [facility]) 10    Patient Active Problem List   Diagnosis Date Noted   Pneumonia    Mini stroke    H/O hiatal hernia    H/O echocardiogram    H/O  cardiovascular stress test    Arthritis    Anxiety    Orthostatic hypotension 10/21/2016   Sleep apnea    Coronary artery disease    Abnormal stress test 09/07/2016   Diminished pulses in lower extremity 08/21/2015   Deviated septum 09/08/2014   Chest tightness 08/18/2012   Hypertension    GERD (gastroesophageal reflux disease)    Diabetes mellitus    OSA (obstructive sleep apnea)    Obesity    Hyperlipidemia    Stroke ([place]) 12/02/2008    Past Surgical History:  Procedure Laterality Date   BUNIONECTOMY     CARDIAC CATHETERIZATION N/A 09/06/2016   Procedure: Left Heart Cath and Coronary Angiography;  Surgeon: Burnell Blanks, MD;  Location: [place] CV LAB;  Service: Cardiovascular;  Laterality: N/A;   CARDIAC CATHETERIZATION N/A 09/06/2016   Procedure: Coronary Stent Intervention;  Surgeon: Burnell Blanks, MD;  Location: [place] [facility];  Service: Cardiovascular;  Laterality: N/A;   COLONOSCOPY  2011   CORONARY STENT PLACEMENT  09/06/2016   Severe stenosis mid LAD, s/p successful PTCA/DES x 1   FOOT SURGERY Left    bunion   Hydrocell Removal     KNEE SURGERY Left  arthroscopy   NASAL SEPTOPLASTY W/ TURBINOPLASTY N/A 09/08/2014   Procedure: NASAL SEPTOPLASTY WITH TURBINATE REDUCTION;  Surgeon: [name], MD;  Location: [facility] OR;  Service: ENT;  Laterality: N/A;   TONSILLECTOMY         Family History  Problem Relation Age of Onset   Prostate cancer Father    Colon cancer Father    Heart attack Mother    Heart disease Other        paternal grandparents   Alzheimer's disease Maternal Grandmother    Stomach cancer Neg Hx     Social History   Tobacco Use   Smoking status: Former    Packs/day: 3.50    Years: 20.00    Pack years: 70.00    Types: Cigarettes    Quit date: 12/02/1984    Years since quitting: 36.8   Smokeless tobacco: Never  Vaping Use   Vaping Use: Never used  Substance Use Topics   Alcohol use: No    Comment: quit 85   Drug  use: No    Home Medications Prior to Admission medications   Medication Sig Start Date End Date Taking? Authorizing Provider  aspirin 81 MG tablet Take 81 mg by mouth daily.    [provider]  atorvastatin (LIPITOR) 40 MG tablet TAKE ONE TABLET BY MOUTH ONCE DAILY AT  6  PM 03/18/17   Burnell Blanks, MD  clopidogrel (PLAVIX) 75 MG tablet Take 1 tablet by mouth once daily 01/22/21   Nahser, Wonda Cheng, MD  EPINEPHrine (EPIPEN) 0.3 mg/0.3 mL SOAJ injection Inject 0.3 mLs (0.3 mg total) into the muscle once. For signs or symptoms of anaphylaxis. 02/20/14   Horton, Barbette Hair, MD  escitalopram (LEXAPRO) 10 MG tablet Take 10 mg by mouth daily.  02/11/13   [provider]  irbesartan (AVAPRO) 75 MG tablet Take 1 tablet (75 mg total) by mouth daily. 04/10/21   Nahser, Wonda Cheng, MD  Multiple Vitamin (MULTIVITAMIN) tablet Take 1 tablet by mouth daily.    [provider]  nitroGLYCERIN (NITROSTAT) 0.4 MG SL tablet Place 1 tablet (0.4 mg total) under the tongue every 5 (five) minutes as needed for chest pain. 01/14/20   Nahser, Wonda Cheng, MD  OMEGA 3 1200 MG CAPS Take 1,200 mg by mouth daily.    [provider]  pantoprazole (PROTONIX) 40 MG tablet Take 40 mg by mouth 2 (two) times daily. 07/17/17   [provider]  triamterene-hydrochlorothiazide (DYAZIDE) 37.5-25 MG capsule Take 1 each (1 capsule total) by mouth daily. 04/10/21   Nahser, Wonda Cheng, MD    Allergies    Bee venom, Bee pollen, and Hydrocodone-acetaminophen  Review of Systems   Review of Systems  Constitutional:  Negative for chills and fever.  HENT:  Negative for ear pain and sore throat.   Eyes:  Negative for pain and visual disturbance.  Respiratory:  Negative for cough and shortness of breath.   Cardiovascular:  Positive for chest pain. Negative for palpitations and leg swelling.  Gastrointestinal:  Negative for abdominal pain, nausea and vomiting.  Genitourinary:  Negative for dysuria  and hematuria.  Musculoskeletal:  Negative for arthralgias and back pain.  Skin:  Negative for color change and rash.  Neurological:  Negative for seizures and syncope.  All other systems reviewed and are negative.  Physical Exam Updated Vital Signs BP 133/79   Pulse (!) 56   Temp 98.5 F (36.9 C) (Oral)   Resp 15   SpO2 100%  Physical Exam Vitals and nursing note reviewed.  Constitutional:      Appearance: He is well-developed.  HENT:     Head: Normocephalic and atraumatic.  Eyes:     Conjunctiva/sclera: Conjunctivae normal.  Cardiovascular:     Rate and Rhythm: Normal rate and regular rhythm.     Heart sounds: No murmur heard.    Comments: DP, PT, radial pulse palpable bilaterally.  Symmetrical Pulmonary:     Effort: Pulmonary effort is normal. No respiratory distress.     Breath sounds: Normal breath sounds.  Abdominal:     Palpations: Abdomen is soft.     Tenderness: There is no abdominal tenderness.  Musculoskeletal:     Cervical back: Neck supple.     Right lower leg: Edema present.     Left lower leg: Edema present.     Comments: Trace bilateral edema  Skin:    General: Skin is warm and dry.  Neurological:     Mental Status: He is alert.    ED Results / Procedures / Treatments   Labs (all labs ordered are listed, but only abnormal results are displayed) Labs Reviewed  BASIC METABOLIC PANEL - Abnormal; Notable for the following components:      Result Value   Glucose, Bld 112 (*)    All other components within normal limits  CBC  TROPONIN I (HIGH SENSITIVITY)  TROPONIN I (HIGH SENSITIVITY)    EKG EKG Interpretation  Date/Time:  Wednesday September 26 2021 08:13:44 EDT Ventricular Rate:  65 PR Interval:  166 QRS Duration: 88 QT Interval:  414 QTC Calculation: 430 R Axis:   148 Text Interpretation: Normal sinus rhythm Right axis deviation Abnormal ECG Confirmed by Thamas Jaegers ([id number]) on 09/26/2021 12:14:11 PM  Radiology DG Chest 2  View  Result Date: 09/26/2021 CLINICAL DATA:  Chest pain EXAM: CHEST - 2 VIEW COMPARISON:  09/06/2014 FINDINGS: The heart size and mediastinal contours are within normal limits. Atherosclerotic calcification of the aortic knob. No focal airspace consolidation, pleural effusion, or pneumothorax. The visualized skeletal structures are unremarkable. IMPRESSION: No active cardiopulmonary disease. Electronically Signed   By: Davina Poke D.O.   On: 09/26/2021 09:18    Procedures Procedures   Medications Ordered in ED Medications - No data to display  ED Course  I have reviewed the triage vital signs and the nursing notes.  Pertinent labs & imaging results that were available during my care of the patient were reviewed by me and considered in my medical decision making (see chart for details).    MDM Rules/Calculators/A&P                           Patient vitals are stable, he is nontoxic-appearing.  He does have impressive cardiac history, work up for ACS.  Patient heart score is 4.  Pain has resolved while in the ED, still intermittent and sharp in nature.  Not reproducible.  Delta troponin negative, EKG without any ST elevations or depressions, no LVH.  No left bundle branch.  Remains unchanged from prior EKGs.  No leukocytosis, no gross electrolyte derangement, work-up overall very reassuring and benign.  Chest pain does not seem cardiac in nature given it is sharp, occurs in seconds, has not worsened by exertion, does not radiate, no associated nausea, vomiting, shortness of breath.  It is not reproducible as it would be if I suspected musculoskeletal strain from increase use of the pressure washer at home.  I  do not think it is an aortic dissection given his blood pressure has been stable and the pain does not radiate to the back.  He has been here for multiple hours without any changes to his blood pressure and without any radiation to the back, overall doubt dissection.  Is not  worsened by laying flat, doubt it is GERD.   Discussed work-up with the patient, overall I do think he is appropriate for discharge with close follow-up from cardiology outpatient.  We engaged in shared decision-making and patient is agreeable with this.  Strict return precautions were discussed and agreed upon.  I discussed the case with my attending who agrees with this plan.  Patient is discharged in stable position.  Final Clinical Impression(s) / ED Diagnoses Final diagnoses:  Chest pain, unspecified type    Rx / DC Orders ED Discharge Orders     None        Sherrill Raring, Hershal Coria 09/26/21 1225    Luna Fuse, MD 09/26/21 1525

## 2021-09-26 NOTE — ED Provider Notes (Signed)
Emergency Medicine Provider Triage Evaluation Note  Matthew Love , a 70 y.o. male  was evaluated in triage.  Pt complains of chest pain.  Patient has a history of coronary artery disease.  He reports starting last night having episodes of very intense, fleeting, sharp stabbing pains in his left chest and axilla area.  This is different than the pain he had with previous ischemia.  No associated vomiting, diaphoresis.  Pain occurs at rest.  He does report power washing recently.  No fevers or cough.  Last episode of pain was about 2 hours ago.  Review of Systems  Positive: Chest pain Negative: Shortness of breath  Physical Exam  BP 116/77 (BP Location: Right Arm)   Pulse 66   Temp 98.5 F (36.9 C) (Oral)   Resp 18   SpO2 97%  Gen:   Awake, no distress   Resp:  Normal effort  MSK:   Moves extremities without difficulty  Other:  Palpation over the chest wall does not reproduce pain, lungs are clear.  Heart regular rate and rhythm without murmur.  Medical Decision Making  Medically screening exam initiated at 8:43 AM.  Appropriate orders placed.  Matthew Love was informed that the remainder of the evaluation will be completed by another provider, this initial triage assessment does not replace that evaluation, and the importance of remaining in the ED until their evaluation is complete.     Carlisle Cater, PA-C 09/26/21 [id number]    Fredia Sorrow, MD 09/27/21 [phone]

## 2021-09-26 NOTE — Telephone Encounter (Signed)
Last night sharp pains on left side of chest.  It woke him up and/or he was already awake.  These were intermittent.  No radiation.  No SOB.  On/off several hours.  Using pressure washer around the house. Wonders if that is contributing.  Cannot reproduce with movement.   It was a worse pain than when he got his stents in 2017.  This concerned him.  He went to the ER.  Was sent home and adv to follow up with cardiology.    He has continued to have the same pain today but less often.  He did not try ntg.  Pt aware I will forward to Dr. Acie Fredrickson for recommendation and will call him back.

## 2021-09-26 NOTE — Discharge Instructions (Signed)
Your work-up today was reassuring, the x-ray did not show any signs of pneumonia.  Troponin which is acardiac enzyme marker did not show any elevation concerning for heart attack.  Your EKG also looked good, your blood sugar was also within normal limits.    I recommend following up with your cardiologist, call them this afternoon to schedule follow-up and inform them that you are seen in the ED for chest pain.  If chest pain returns or changes significantly please return back for additional evaluation as needed.    Avoid [employer].

## 2021-09-26 NOTE — Telephone Encounter (Signed)
Pt c/o of Chest Pain: STAT if CP now or developed within 24 hours  1. Are you having CP right now? no  2. Are you experiencing any other symptoms (ex. SOB, nausea, vomiting, sweating)? no  3. How long have you been experiencing CP? Since last night   4. Is your CP continuous or coming and going? Comes and goes   5. Have you taken Nitroglycerin? no ?

## 2021-09-28 NOTE — Telephone Encounter (Signed)
Called patient back to check on him. Patient stated he is feeling fine now and he does not think he needs to come in. Encouraged patient to call if his chest pain comes back.

## 2022-02-21 ENCOUNTER — Other Ambulatory Visit: Payer: Self-pay

## 2022-02-21 ENCOUNTER — Ambulatory Visit: Payer: Medicare Other | Admitting: Physician Assistant

## 2022-02-21 ENCOUNTER — Encounter: Payer: Self-pay | Admitting: Physician Assistant

## 2022-02-21 VITALS — BP 128/62 | HR 61 | Ht 74.0 in | Wt 268.8 lb

## 2022-02-21 DIAGNOSIS — E782 Mixed hyperlipidemia: Secondary | ICD-10-CM

## 2022-02-21 DIAGNOSIS — I10 Essential (primary) hypertension: Secondary | ICD-10-CM | POA: Diagnosis not present

## 2022-02-21 DIAGNOSIS — I2510 Atherosclerotic heart disease of native coronary artery without angina pectoris: Secondary | ICD-10-CM | POA: Diagnosis not present

## 2022-02-21 NOTE — Patient Instructions (Signed)
Medication Instructions:  ?Your physician recommends that you continue on your current medications as directed. Please refer to the Current Medication list given to you today. ?*If you need a refill on your cardiac medications before your next appointment, please call your pharmacy* ? ? ?Lab Work: ?None Ordered ? ? ?Testing/Procedures: ?None Ordered ? ? ?Follow-Up: ?At [facility], you and your health needs are our priority.  As part of our continuing mission to provide you with exceptional heart care, we have created designated Provider Care Teams.  These Care Teams include your primary Cardiologist (physician) and Advanced Practice Providers (APPs -  Physician Assistants and Nurse Practitioners) who all work together to provide you with the care you need, when you need it. ? ?We recommend signing up for the patient portal called "MyChart".  Sign up information is provided on this After Visit Summary.  MyChart is used to connect with patients for Virtual Visits (Telemedicine).  Patients are able to view lab/test results, encounter notes, upcoming appointments, etc.  Non-urgent messages can be sent to your provider as well.   ?To learn more about what you can do with MyChart, go to [email or web address].   ? ?Your next appointment:   ?1 year(s) ? ?The format for your next appointment:   ?In Person ? ?Provider:   ?Mertie Moores, MD   ? ? ?Other Instructions ?  ?

## 2022-02-21 NOTE — Progress Notes (Signed)
?Cardiology Office Note:   ? ?Date:  02/21/2022  ? ?ID:  Matthew Love, DOB [birth year], MRN [id number] ? ?PCP:  Burnard Bunting, MD  ?[facility] Cardiologist:  Mertie Moores, MD  ?[facility] Electrophysiologist:  None  ? ?Chief Complaint: yearly follow up ? ?History of Present Illness:   ? ?Matthew Love is a 71 y.o. male with a hx of with history of CAD, hypertension, hyperlipidemia, diabetes mellitus, gout and sleep apnea (noncompliant with CPAP) patient here for follow-up. ? ?Hx of CAD  S/p stenting of prox / mid LAD and distal LAD in Oct 2017. ?Low risk stress test 01/2020. ? ?Patient is here for follow-up.  Denies chest pain, shortness of breath, orthopnea, PND, syncope, lower extremity edema or melena.  He used to walk 30 minutes every day but no ongoing for past few months.  He is planning to restart routine. ? ?Past Medical History:  ?Diagnosis Date  ? Anxiety   ? Arthritis   ? Coronary artery disease   ? a. NST 08/2016: high-risk w/ inferior and lateral wall ischemia  b. cath 09/2016: 80% mid-LAD and 90% distal LAD stenosis. Placement of 2 DES. Mild nonobstructive dz along RCA and LCx.  ? Diabetes mellitus   ? A1C 6 weeks ago as of 09/08/14 was 6.2. Not on oral meds.  ? GERD (gastroesophageal reflux disease)   ? H/O cardiovascular stress test   ? ETT-Myoview 11/11: Inferior fixed defect likely due to diaphragmatic attenuation with normal wall motion, EF 69%  ? H/O echocardiogram   ? Echo 11/11: EF 51-88%, grade 1 diastolic dysfunction, mild LAE  ? H/O hiatal hernia   ? Hyperlipidemia   ? Hypertension   ? Mini stroke   ? Obesity   ? Pneumonia   ? hx  ? Sleep apnea   ? cpap 1 yr  ? Stroke [facility]) 10  ? ? ?Past Surgical History:  ?Procedure Laterality Date  ? BUNIONECTOMY    ? CARDIAC CATHETERIZATION N/A 09/06/2016  ? Procedure: Left Heart Cath and Coronary Angiography;  Surgeon: Burnell Blanks, MD;  Location: [place] [facility];  Service: Cardiovascular;  Laterality: N/A;  ? CARDIAC  CATHETERIZATION N/A 09/06/2016  ? Procedure: Coronary Stent Intervention;  Surgeon: Burnell Blanks, MD;  Location: [place] [facility];  Service: Cardiovascular;  Laterality: N/A;  ? COLONOSCOPY  2011  ? CORONARY STENT PLACEMENT  09/06/2016  ? Severe stenosis mid LAD, s/p successful PTCA/DES x 1  ? FOOT SURGERY Left   ? bunion  ? Hydrocell Removal    ? KNEE SURGERY Left   ? arthroscopy  ? NASAL SEPTOPLASTY W/ TURBINOPLASTY N/A 09/08/2014  ? Procedure: NASAL SEPTOPLASTY WITH TURBINATE REDUCTION;  Surgeon: [name], MD;  Location: [place];  Service: ENT;  Laterality: N/A;  ? TONSILLECTOMY    ? ? ?Current Medications: ?Current Meds  ?Medication Sig  ? aspirin 81 MG tablet Take 81 mg by mouth daily.  ? atorvastatin (LIPITOR) 40 MG tablet TAKE ONE TABLET BY MOUTH ONCE DAILY AT  6  PM  ? clopidogrel (PLAVIX) 75 MG tablet Take 1 tablet by mouth once daily  ? EPINEPHrine (EPIPEN) 0.3 mg/0.3 mL SOAJ injection Inject 0.3 mLs (0.3 mg total) into the muscle once. For signs or symptoms of anaphylaxis.  ? escitalopram (LEXAPRO) 10 MG tablet Take 10 mg by mouth daily.   ? irbesartan (AVAPRO) 75 MG tablet Take 1 tablet (75 mg total) by mouth daily.  ? Multiple Vitamin (MULTIVITAMIN) tablet  Take 1 tablet by mouth daily.  ? nitroGLYCERIN (NITROSTAT) 0.4 MG SL tablet Place 1 tablet (0.4 mg total) under the tongue every 5 (five) minutes as needed for chest pain.  ? OMEGA 3 1200 MG CAPS Take 1,200 mg by mouth daily.  ? pantoprazole (PROTONIX) 40 MG tablet Take 40 mg by mouth 2 (two) times daily.  ? triamterene-hydrochlorothiazide (DYAZIDE) 37.5-25 MG capsule Take 1 each (1 capsule total) by mouth daily.  ?  ? ?Allergies:   Bee venom, Bee pollen, Oxycodone-acetaminophen, and Hydrocodone-acetaminophen  ? ?Social History  ? ?Socioeconomic History  ? Marital status: Married  ?  Spouse name: Not on file  ? Number of children: Not on file  ? Years of education: Not on file  ? Highest education level: Not on file  ?Occupational History   ? Occupation: Drivers Ed Pharmacist, hospital  ?  Employer: [employer]  ?Tobacco Use  ? Smoking status: Former  ?  Packs/day: 3.50  ?  Years: 20.00  ?  Pack years: 70.00  ?  Types: Cigarettes  ?  Quit date: 12/02/1984  ?  Years since quitting: 37.2  ? Smokeless tobacco: Never  ?Vaping Use  ? Vaping Use: Never used  ?Substance and Sexual Activity  ? Alcohol use: No  ?  Comment: quit 85  ? Drug use: No  ? Sexual activity: Not on file  ?Other Topics Concern  ? Not on file  ?Social History Narrative  ? Not on file  ? ?Social Determinants of Health  ? ?Financial Resource Strain: Not on file  ?Food Insecurity: Not on file  ?Transportation Needs: Not on file  ?Physical Activity: Not on file  ?Stress: Not on file  ?Social Connections: Not on file  ?  ? ?Family History: ?The patient's family history includes Alzheimer's disease in his maternal grandmother; Colon cancer in his father; Heart attack in his mother; Heart disease in an other family member; Prostate cancer in his father. There is no history of Stomach cancer.   ? ?ROS:   ?Please see the history of present illness.    ?All other systems reviewed and are negative.  ? ?EKGs/Labs/Other Studies Reviewed:   ? ?The following studies were reviewed today: ? ?Coronary Stent Intervention  09/2016  ?Left Heart Cath and Coronary Angiography  ? ?Conclusion ?Ost RCA to Prox RCA lesion, 30 %stenosed. ?Mid RCA lesion, 20 %stenosed. ?Ost Cx to Prox Cx lesion, 20 %stenosed. ?A STENT PROMUS PREM MR 3.0X16 drug eluting stent was successfully placed. ?Prox LAD to Mid LAD lesion, 80 %stenosed. ?Post intervention, there is a 0% residual stenosis. ?A STENT PROMUS PREM MR 2.25X20 drug eluting stent was successfully placed. ?Dist LAD lesion, 90 %stenosed. ?Post intervention, there is a 0% residual stenosis. ?  ?1. Severe single vessel CAD ?2. Severe stenosis mid LAD, s/p successful PTCA/DES x 1 ?3. Severe stenosis distal LAD s/p successful PTCA/DES x 1 ?4. Mild non-obstructive disease in the RCA  and Circumflex ?  ?Recommendations: will plan DAPT with ASA and Brilinta for one year. Will start statin. Will not start beta blocker given his bradycardia.  ? ?Stress test 01/2020 ?There was no ST segment deviation noted during stress. ?Nuclear stress EF: 57%. ?The left ventricular ejection fraction is normal (55-65%). ?The study is normal. ?This is a low risk study. ?  ?Fixed inferior defect with normal wall motion in this area, suggests artifact ?  ? ?EKG:  EKG is not ordered today.   ? ?Recent Labs: ?09/26/2021: BUN 18;  Creatinine, Ser 1.01; Hemoglobin 15.7; Platelets 259; Potassium 3.6; Sodium 138  ?Recent Lipid Panel ?   ?Component Value Date/Time  ? CHOL 129 01/15/2021 0849  ? TRIG 144 01/15/2021 0849  ? HDL 46 01/15/2021 0849  ? CHOLHDL 2.8 01/15/2021 0849  ? CHOLHDL 5.3 09/07/2016 0709  ? VLDL 52 (H) 09/07/2016 0709  ? [place] 58 01/15/2021 0849  ? ? ?Physical Exam:   ? ?VS:  BP 128/62 (BP Location: Right Arm, Patient Position: Sitting, Cuff Size: Large)   Pulse 61   Ht '6\' 2"'$  (1.88 m)   Wt 268 lb 12.8 oz (121.9 kg)   SpO2 97%   BMI 34.51 kg/m?    ? ?Wt Readings from Last 3 Encounters:  ?02/21/22 268 lb 12.8 oz (121.9 kg)  ?01/15/21 267 lb 12.8 oz (121.5 kg)  ?08/31/20 270 lb (122.5 kg)  ?  ? ?GEN:  Well nourished, well developed in no acute distress ?HEENT: Normal ?NECK: No JVD; No carotid bruits ?LYMPHATICS: No lymphadenopathy ?CARDIAC: RRR, no murmurs, rubs, gallops ?RESPIRATORY:  Clear to auscultation without rales, wheezing or rhonchi  ?ABDOMEN: Soft, non-tender, non-distended ?MUSCULOSKELETAL:  No edema; No deformity  ?SKIN: Warm and dry ?NEUROLOGIC:  Alert and oriented x 3 ?PSYCHIATRIC:  Normal affect  ? ?ASSESSMENT AND PLAN:  ? ? ?CAD ?No angina.  He is planning to restart exercise regimen.  He has remained on dual antiplatelet therapy with aspirin and Plavix.  Continue Lipitor. ? ?2.  Hyperlipidemia ?Followed by PCP.  Continue Lipitor 40 mg daily.  LDL 58 on 04/2021. ? ? ?3.  Hypertension ?-Blood  pressure is excellently controlled.  No change in therapy. ? ?Medication Adjustments/Labs and Tests Ordered: ?Current medicines are reviewed at length with the patient today.  Concerns regarding medicines are ou

## 2022-03-29 ENCOUNTER — Ambulatory Visit: Payer: Medicare Other | Admitting: Cardiovascular Disease

## 2022-05-27 ENCOUNTER — Telehealth: Payer: Self-pay | Admitting: Cardiovascular Disease

## 2022-05-28 MED ORDER — FUROSEMIDE 40 MG PO TABS: 40.0000 mg | ORAL_TABLET | Freq: Every day | ORAL | 0 refills | Status: DC

## 2022-05-28 MED ORDER — POTASSIUM CHLORIDE ER 10 MEQ PO TBCR: 10.0000 meq | EXTENDED_RELEASE_TABLET | Freq: Two times a day (BID) | ORAL | 1 refills | Status: DC

## 2022-05-28 NOTE — Telephone Encounter (Signed)
Nahser, Deloris Ping, MD sent to Eugene Gavia K Caller: Unspecified Burgess Estelle,  4:06 PM) Agree with note from Eugene Gavia, RN  Needs leg elevation - higher than his heart  Can look into getting a Lounge Doctor leg rest to help  [facility] to take  Lasix 40 mg a day  Kdur 10 meq BID for 3 days  -in addition to his other meds  Make sure he is avoiding salt , fast foods,    PN    Called and spoke with wife who is currently in orthopedic dr office at this time. Gave her above verbal recommendations and told her I would send MyChart message as well.

## 2022-05-28 NOTE — Telephone Encounter (Signed)
Returned call to patient who had me speak with his wife. Wife states that pt's L leg from foot to mid-calf is hot, red, painful, and swollen. RLE is mildly painful, but no other symptoms such as LLE. Denies any injury to leg or foot, also denies any skin tears, scars, or markings. States it has been this way for one week, but progressively worsening. Wife states they are going to orthopedic walk-in clinic today. Pt confirms he is taking Aspirin 81mg  daily and Clopidogrel 75mg  daily with no missed doses. Pt is diabetic, doesn't inspect his feet, but again denies injury. Advised that it could be a DVT, but low likelihood since he's on DOAC. Could also be cellulitis, but without an injury, also seems unlikely. Could be developing venous stasis or PAD (circulation disorder of some sort), either way, the plan for ortho would be good for fast results and hopefully ultrasound of extremity. Pt's wife will call us back tomorrow if she has any concerns or feels the issue has not been properly addressed.

## 2022-05-29 ENCOUNTER — Telehealth: Payer: Self-pay | Admitting: Cardiovascular Disease

## 2022-05-29 MED ORDER — POTASSIUM CHLORIDE ER 10 MEQ PO TBCR: 10.0000 meq | EXTENDED_RELEASE_TABLET | Freq: Two times a day (BID) | ORAL | 0 refills | Status: DC

## 2022-05-29 NOTE — Telephone Encounter (Signed)
Follow Up:    Matthew Love needs to clarify the directions on patient's Potassium.

## 2022-05-29 NOTE — Telephone Encounter (Signed)
Called pt's pharmacy, resent pt's medication potassium to the pharmacy again with no refills. Confirmation received. Pharmacy tech verbalized understanding.

## 2022-06-07 DIAGNOSIS — I4891 Unspecified atrial fibrillation: Secondary | ICD-10-CM | POA: Diagnosis not present

## 2022-06-08 ENCOUNTER — Encounter: Payer: Self-pay | Admitting: Cardiovascular Disease

## 2022-06-08 DIAGNOSIS — I4891 Unspecified atrial fibrillation: Secondary | ICD-10-CM | POA: Diagnosis not present

## 2022-06-10 ENCOUNTER — Encounter: Payer: Self-pay | Admitting: Cardiovascular Disease

## 2022-06-10 NOTE — Progress Notes (Unsigned)
[place], [place]  [address] Phone: [phone] Fax:  [phone]  Date:  06/10/2022   Name:  Matthew Love   DOB:  [birth year]   MRN:  [id number]  PCP:  Burnard Bunting, MD  Primary Cardiologist:  Dr. Liam Rogers  Primary Electrophysiologist:  None    Notes prior to 2013:  Matthew Love is a 71 y.o. male who returns for evaluation of chest pain.  He has a history of HTN, DM2, HL, GERD and sleep apnea. He is noncompliant with CPAP.   Echo 11/11: EF 21-11%, grade 1 diastolic dysfunction, mild LAE.  ETT-Myoview 11/11: Inferior fixed defect likely due to diaphragmatic attenuation with normal wall motion, EF 69%.   Over the last 2-3 months, he has noted worsening left-sided chest pain. He can point to it with one finger. It feels dull. He denies any exertional chest pain. He notes it only at rest. He denies any radiating symptoms or associated nausea, diaphoresis. He denies associated shortness of breath. He does have dyspnea with more extreme activities. He denies orthopnea, PND or significant pedal edema. He has had a cough over last several months. This is productive of clear sputum. He was prescribed a Z-Pak at one point with his primary care provider. He denies any recent travels, injuries to his lower extremities or recent hospitalizations.  He denies pleuritic chest pain or chest pain with lying supine.  Sept. 17, 2013 -   He was seen by [name] in August for chest pain. A stress Myoview study was normal. He increase his Nexium and the mild chest discomfort improved.  Sept. 19, 2016:  Doing well  . Retired from [employer]   Exercising  Some .   Walking 30 minutes every AM.   His foot and toes on left side get very cold   Sept. 21, 2017:  Matthew Love is seen today for a work in visit Had some chest pain  Center of chest.    Seemed to be worse when he put his hand on his chest  Awoke last night with some left sided chest pain   Usually occurs when he is lying down. Walking 20 minutes a day -  Has gained about 10 lbs over the past month    Nov. 20, 2017:    Matthew Love is seen back .  He had stenting of his proximal/mid LAD and stenting of his distal  in October, 2017. [name] Since that time he's had some lightheadedness / dizziness. Had a GI bug this past weekend.   Had some diarrhea and poor appetite.   He has improved his diet. Has lost weight - 25-30 lbs since his the stent procedure .  Is avoiding salt.    Feb. 26, 2018:  Has lost another 40 lbs. Is watching his diet  No CP .   Oct. 1, 2018:    Doing well.   No angina ,  Has MSK aches ,  No pain similar to her previous angina   , no dyspnea.  Not exercising as much.  Having some back issues.  January 09, 2018:  Matthew Love is doing well  No CP  Has not been sleeping well Has been having some chest pain  Has lots of dizziness.   Wife [name] thinks it might be due to Imdur  Wants to start walking more  Retired from teaching   Apr 16, 2018:   No further episodes of CP  Walks 30 minutes  5 days a week.   Has been gaining weight  - 7 lbs since lst Nov. 2018  Nov. 13, 2019: No   dyspnea Had 1 episode of dizziness while walking  Had an episode of CP in Sept - went to the ER Work up was negative.  Has not recurred.  Found nothing wrong Has gained weight.  Knows he has not been eating right .   exercising regularly.     Feb. 12, 2021: Matthew Love is seen today for a work in visit because of some episodes of chest discomfort. Does not know if these are similar to his previous episodes  He has a history of coronary artery disease.  He had a stent placed to the proximal to mid LAD in October, 2017.  He also had a distal LAD stent at that same time. Has occasional CP ,  Better after he took several aspirin   Is having some right leg pain   Worse after he has been sitting down .   Is not worsened with exertion.   Sounds more like radicular pain .     Feb. 14,  2022: Matthew Love is seen today for follow up visit for his CAD No angina .   Has some DOE ,  Has stopped walking and realizes that he needs to get back into exercising  Wt. Is 267 lbs   June 11, 2022 Matthew Love is seen as a work in visit today for new onset atrial fib Has had progressive swelling of his legs. Was seen in the [facility] ER and was found to have atrial fib  He has been started on Eliquis   Has has leg swelling ,  we initially prescribed lasix but he never took it  [facility] is red and swollen   Had near syncope ,  felt that his HR   Went to [place] IV potassium  Maxdize was stopped   Was found to have atrial fib in [place].  Converted back to NSR before he left  Has converted back to NSR as of today   His current symptoms do not feel  like his  Has OSA , does not wear his CPAP mask  Still eating lots of processed meats    Wt Readings from Last 3 Encounters:  02/21/22 268 lb 12.8 oz (121.9 kg)  01/15/21 267 lb 12.8 oz (121.5 kg)  08/31/20 270 lb (122.5 kg)     Past Medical History:  Diagnosis Date   Anxiety    Arthritis    Coronary artery disease    a. NST 08/2016: high-risk w/ inferior and lateral wall ischemia  b. cath 09/2016: 80% mid-LAD and 90% distal LAD stenosis. Placement of 2 DES. Mild nonobstructive dz along RCA and LCx.   Diabetes mellitus    A1C 6 weeks ago as of 09/08/14 was 6.2. Not on oral meds.   GERD (gastroesophageal reflux disease)    H/O cardiovascular stress test    ETT-Myoview 11/11: Inferior fixed defect likely due to diaphragmatic attenuation with normal wall motion, EF 69%   H/O echocardiogram    Echo 11/11: EF 30-09%, grade 1 diastolic dysfunction, mild LAE   H/O hiatal hernia    Hyperlipidemia    Hypertension    Mini stroke    Obesity    Pneumonia    hx   Sleep apnea    cpap 1 yr   Stroke [facility]) 10    Current Outpatient Medications  Medication  Sig Dispense Refill   aspirin 81 MG tablet Take 81  mg by mouth daily.     atorvastatin (LIPITOR) 40 MG tablet TAKE ONE TABLET BY MOUTH ONCE DAILY AT  6  PM 30 tablet 6   clopidogrel (PLAVIX) 75 MG tablet Take 1 tablet by mouth once daily 90 tablet 3   EPINEPHrine (EPIPEN) 0.3 mg/0.3 mL SOAJ injection Inject 0.3 mLs (0.3 mg total) into the muscle once. For signs or symptoms of anaphylaxis. 1 Device 0   escitalopram (LEXAPRO) 10 MG tablet Take 10 mg by mouth daily.      furosemide (LASIX) 40 MG tablet Take 1 tablet (40 mg total) by mouth daily. 30 tablet 0   irbesartan (AVAPRO) 75 MG tablet Take 1 tablet (75 mg total) by mouth daily. 90 tablet 3   Multiple Vitamin (MULTIVITAMIN) tablet Take 1 tablet by mouth daily.     nitroGLYCERIN (NITROSTAT) 0.4 MG SL tablet Place 1 tablet (0.4 mg total) under the tongue every 5 (five) minutes as needed for chest pain. 25 tablet 6   OMEGA 3 1200 MG CAPS Take 1,200 mg by mouth daily.     pantoprazole (PROTONIX) 40 MG tablet Take 40 mg by mouth 2 (two) times daily.     potassium chloride (KLOR-CON) 10 MEQ tablet Take 1 tablet (10 mEq total) by mouth 2 (two) times daily. 60 tablet 0   triamterene-hydrochlorothiazide (DYAZIDE) 37.5-25 MG capsule Take 1 each (1 capsule total) by mouth daily. 90 capsule 3   No current facility-administered medications for this visit.    Allergies: Allergies  Allergen Reactions   Bee Venom Anaphylaxis, Itching and Swelling   Bee Pollen    Oxycodone-Acetaminophen     Other reaction(s): Unknown   Hydrocodone-Acetaminophen Rash    Social History   Tobacco Use   Smoking status: Former    Packs/day: 3.50    Years: 20.00    Total pack years: 70.00    Types: Cigarettes    Quit date: 12/02/1984    Years since quitting: 37.5   Smokeless tobacco: Never  Vaping Use   Vaping Use: Never used  Substance Use Topics   Alcohol use: No    Comment: quit 85   Drug use: No     ROS:  Please see the history of present illness.     All other systems reviewed and negative.     Physical Exam: There were no vitals taken for this visit.  GEN:  Well nourished, well developed in no acute distress HEENT: Normal NECK: No JVD; No carotid bruits LYMPHATICS: No lymphadenopathy CARDIAC: RRR , no murmurs, rubs, gallops RESPIRATORY:  Clear to auscultation without rales, wheezing or rhonchi  ABDOMEN: Soft, non-tender, non-distended MUSCULOSKELETAL:  No edema; No deformity  SKIN: Warm and dry NEUROLOGIC:  Alert and oriented x 3     EKG:        ASSESSMENT AND PLAN:   Atrial Fib:  new diagnosis of Atrial fib CHADS2VASC is  5  ( age 5, cad, HTN) On eliquis  He has converted toNSR    2.    CAD :     S/p stenting of prox / mid LAD and distal LAD in OCt. 2017 No angina .   Cont meds.    3.   Hypertension:    His maxzide was stopped ( unclear reason )  Will observe . Restart a diuretic if he develops any swelling.  He still eats very salty food.  I strongly encouraged him  to avoid salty foods.  4.  Hyperlipidemia: -   Return to see [name], PA in 3 months   Mertie Moores, MD  06/10/2022 8:08 PM    [facility] [address],  [place], [place]  [address] Pager [phone] Phone: [phone]; Fax: [phone]

## 2022-06-11 ENCOUNTER — Ambulatory Visit: Payer: Medicare Other | Admitting: Cardiovascular Disease

## 2022-06-11 ENCOUNTER — Encounter: Payer: Self-pay | Admitting: Cardiovascular Disease

## 2022-06-11 VITALS — BP 138/72 | HR 62 | Ht 74.0 in | Wt 272.2 lb

## 2022-06-11 DIAGNOSIS — G4733 Obstructive sleep apnea (adult) (pediatric): Secondary | ICD-10-CM

## 2022-06-11 DIAGNOSIS — R600 Localized edema: Secondary | ICD-10-CM

## 2022-06-11 DIAGNOSIS — I10 Essential (primary) hypertension: Secondary | ICD-10-CM

## 2022-06-11 DIAGNOSIS — I2510 Atherosclerotic heart disease of native coronary artery without angina pectoris: Secondary | ICD-10-CM | POA: Diagnosis not present

## 2022-06-11 NOTE — Patient Instructions (Signed)
Medication Instructions:  Your physician recommends that you continue on your current medications as directed. Please refer to the Current Medication list given to you today.  *If you need a refill on your cardiac medications before your next appointment, please call your pharmacy*   Lab Work: NONE If you have labs (blood work) drawn today and your tests are completely normal, you will receive your results only by: [place] (if you have MyChart) OR A paper copy in the mail If you have any lab test that is abnormal or we need to change your treatment, we will call you to review the results.   Testing/Procedures: ECHO Your physician has requested that you have an echocardiogram. Echocardiography is a painless test that uses sound waves to create images of your heart. It provides your doctor with information about the size and shape of your heart and how well your heart's chambers and valves are working. This procedure takes approximately one hour. There are no restrictions for this procedure.  Follow-Up: At [facility], you and your health needs are our priority.  As part of our continuing mission to provide you with exceptional heart care, we have created designated Provider Care Teams.  These Care Teams include your primary Cardiologist (physician) and Advanced Practice Providers (APPs -  Physician Assistants and Nurse Practitioners) who all work together to provide you with the care you need, when you need it.  Your next appointment:   3 month(s)  The format for your next appointment:   In Person  Provider:   [name], PA {     Important Information About Sugar

## 2022-06-12 ENCOUNTER — Telehealth: Payer: Self-pay | Admitting: *Deleted

## 2022-06-12 DIAGNOSIS — I2510 Atherosclerotic heart disease of native coronary artery without angina pectoris: Secondary | ICD-10-CM

## 2022-06-12 DIAGNOSIS — I10 Essential (primary) hypertension: Secondary | ICD-10-CM

## 2022-06-12 DIAGNOSIS — G4733 Obstructive sleep apnea (adult) (pediatric): Secondary | ICD-10-CM

## 2022-06-12 DIAGNOSIS — G459 Transient cerebral ischemic attack, unspecified: Secondary | ICD-10-CM

## 2022-06-12 NOTE — Telephone Encounter (Signed)
Order placed for sleep study Per Dr Acie Fredrickson.

## 2022-06-12 NOTE — Telephone Encounter (Signed)
Patient's wife states she is returning a call regarding sleep study.

## 2022-06-23 ENCOUNTER — Encounter: Payer: Self-pay | Admitting: Cardiovascular Disease

## 2022-06-24 ENCOUNTER — Other Ambulatory Visit: Payer: Self-pay | Admitting: *Deleted

## 2022-06-24 ENCOUNTER — Telehealth: Payer: Self-pay

## 2022-06-24 MED ORDER — ELIQUIS 5 MG PO TABS: 5.0000 mg | ORAL_TABLET | Freq: Two times a day (BID) | ORAL | 1 refills | Status: DC

## 2022-06-24 NOTE — Telephone Encounter (Signed)
Called pt and pt stated that he did not get a 30 day free coupon for Eliquis or samples of Eliquis 5 mg tablet. I informed the pt that I would leave 2 weeks of samples of Elquis 5 mg tablets and a patient assistance application and a 30 day free coupon at the front desk for pt to pick up. Pt also needs a refill sent to his pharmacy. Donalynn Furlong, LPN.  Lot# [id number]   Exp: 03/2024

## 2022-06-24 NOTE — Telephone Encounter (Signed)
**Note De-Identified Matthew Love Obfuscation** Th pts wife and DPR [name] is aware to complete the pts BMSPAF application for Eliquis that has been left in the front office at Dr Elmarie Shiley office at [employer] at [address] [place] in [place] for them to pick up along with a free 30 day card for Eliquis and samples.  She is also aware to obtain the required documents per BMSPAF and to bring all along with his completed application back to the office to drop off and that we will take care of the providers page of his application and will fax all to BMSPAF.  She states that the pt is having an Echo tomorrow and will come by the office to pick up his Eliquis sample, free co-pay card, and BMSPAF application.  She thanked me for calling her and is aware to me back if they have any questions or concerns.

## 2022-06-24 NOTE — Telephone Encounter (Signed)
Sent in refill for  Eliquis '5mg'$  in the Mychart Eliquis encounter from 06/23/2022.   Lov: Nahser, 06/11/2022 Scr: 1.01, 09/26/2021 Weight:123.5 kg  Age:71 yo

## 2022-06-25 ENCOUNTER — Ambulatory Visit (HOSPITAL_COMMUNITY): Payer: Medicare Other | Attending: Internal Medicine

## 2022-06-25 DIAGNOSIS — I10 Essential (primary) hypertension: Secondary | ICD-10-CM | POA: Diagnosis not present

## 2022-06-25 DIAGNOSIS — R600 Localized edema: Secondary | ICD-10-CM | POA: Insufficient documentation

## 2022-06-25 DIAGNOSIS — I2510 Atherosclerotic heart disease of native coronary artery without angina pectoris: Secondary | ICD-10-CM | POA: Insufficient documentation

## 2022-06-25 LAB — ECHOCARDIOGRAM COMPLETE: S' Lateral: 3.2 cm

## 2022-06-25 MED ORDER — PERFLUTREN LIPID MICROSPHERE
1.0000 mL | INTRAVENOUS | Status: AC | PRN
  Administered 2022-06-25: 3 mL via INTRAVENOUS

## 2022-07-13 NOTE — Telephone Encounter (Signed)
Reached out to patients wife to discuss results lmtcb

## 2022-07-18 ENCOUNTER — Encounter: Payer: Self-pay | Admitting: Cardiovascular Disease

## 2022-07-18 DIAGNOSIS — I10 Essential (primary) hypertension: Secondary | ICD-10-CM

## 2022-07-19 MED ORDER — TRIAMTERENE-HCTZ 37.5-25 MG PO TABS: 1.0000 | ORAL_TABLET | Freq: Every day | ORAL | 3 refills | Status: DC

## 2022-07-29 NOTE — Telephone Encounter (Signed)
Called and spoke w patient's wife in reply to [employer]. She said his symptoms came on suddenly on Saturday.  He does not have swelling or chest pains.  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  Has not checked temps but he is still sweating so she is going to check.  No pulse ox.  I asked them to do a COVID test and call his primary care provider today either way.

## 2022-07-29 NOTE — Telephone Encounter (Signed)
I agree with contacting his primary MD  If he gets worse, he should go to the ER   Thanks   PN  _________________________________________________ Called and left message for pt to call back.

## 2022-07-30 ENCOUNTER — Telehealth: Payer: Self-pay | Admitting: Cardiovascular Disease

## 2022-07-30 ENCOUNTER — Ambulatory Visit: Payer: Medicare Other | Attending: Cardiology | Admitting: Cardiology

## 2022-07-30 ENCOUNTER — Ambulatory Visit
Admission: RE | Admit: 2022-07-30 | Discharge: 2022-07-30 | Disposition: A | Discharge status: Home / Self Care | Payer: Medicare Other | Source: Ambulatory Visit | Attending: Cardiology | Admitting: Cardiology

## 2022-07-30 ENCOUNTER — Encounter: Payer: Self-pay | Admitting: Cardiology

## 2022-07-30 VITALS — BP 114/76 | HR 73 | Ht 74.0 in | Wt 257.2 lb

## 2022-07-30 DIAGNOSIS — I10 Essential (primary) hypertension: Secondary | ICD-10-CM | POA: Diagnosis not present

## 2022-07-30 DIAGNOSIS — E782 Mixed hyperlipidemia: Secondary | ICD-10-CM | POA: Diagnosis not present

## 2022-07-30 DIAGNOSIS — R0602 Shortness of breath: Secondary | ICD-10-CM

## 2022-07-30 DIAGNOSIS — I480 Paroxysmal atrial fibrillation: Secondary | ICD-10-CM

## 2022-07-30 DIAGNOSIS — I2510 Atherosclerotic heart disease of native coronary artery without angina pectoris: Secondary | ICD-10-CM | POA: Diagnosis not present

## 2022-07-30 LAB — D-DIMER, QUANTITATIVE: D-DIMER: 0.39 mg/L FEU (ref 0.00–0.49)

## 2022-07-30 LAB — TROPONIN T: Troponin T (Highly Sensitive): 19 ng/L (ref 0–22)

## 2022-07-30 NOTE — Patient Instructions (Signed)
Medication Instructions:  Your physician has recommended you make the following change in your medication:  1) STOP taking aspirin *If you need a refill on your cardiac medications before your next appointment, please call your pharmacy*  Lab Work: TODAY: BNP, CBC, TSH, BMET, d-dimer, troponin If you have labs (blood work) drawn today and your tests are completely normal, you will receive your results only by: [place] (if you have MyChart) OR A paper copy in the mail If you have any lab test that is abnormal or we need to change your treatment, we will call you to review the results.  Testing/Procedures: Your physician has requested that you have a lexiscan myoview. For further information please visit [email or web address]. Please follow instruction sheet, as given.  A chest x-ray takes a picture of the organs and structures inside the chest, including the heart, lungs, and blood vessels. This test can show several things, including, whether the heart is enlarges; whether fluid is building up in the lungs; and whether pacemaker / defibrillator leads are still in place.  Follow-Up: At [facility], you and your health needs are our priority.  As part of our continuing mission to provide you with exceptional heart care, we have created designated Provider Care Teams.  These Care Teams include your primary Cardiologist (physician) and Advanced Practice Providers (APPs -  Physician Assistants and Nurse Practitioners) who all work together to provide you with the care you need, when you need it.  Your next appointment:   2 week(s)  The format for your next appointment:   In Person  Provider:   Mertie Moores, MD      Important Information About Sugar

## 2022-07-30 NOTE — Addendum Note (Signed)
Addended by: Antonieta Iba on: 07/30/2022 11:44 AM   Modules accepted: Orders

## 2022-07-30 NOTE — Telephone Encounter (Signed)
Called spoke with pt and spouse.  Reports for the past week pt has experienced SOB, dizziness and excessive sweating.  Denies CP.   Symptoms occur only with activity.  Reports pt had to lean against the wall while taking a shower a few days ago.  Last BP 07/28/22 138/90-77 reports on 07/21/22 169/96.  Reviewed cardiac meds takes all meds except Lasix 40 mg PO QD and Potassium 10 mEq.  Pt has never taken these medications per spouse.   Scheduled to see DOD Dr. Radford Pax today at 11:30 am.  No further concerns voiced.

## 2022-07-30 NOTE — Telephone Encounter (Signed)
Pt c/o of Chest Pain: STAT if CP now or developed within 24 hours  1. Are you having CP right now? No  2. Are you experiencing any other symptoms (ex. SOB, nausea, vomiting, sweating)? SOB, dizzy, sweating and feeling faint  3. How long have you been experiencing CP? Weekend  4. Is your CP continuous or coming and going? Come and go  5. Have you taken Nitroglycerin? No  Would like a callback as soon as possible. Please advise ?

## 2022-07-30 NOTE — Addendum Note (Signed)
Addended by: Antonieta Iba on: 07/30/2022 11:49 AM   Modules accepted: Orders

## 2022-07-30 NOTE — Progress Notes (Addendum)
[place], [place]  [address] Phone: [phone] Fax:  [phone]  Date:  07/30/2022   Name:  Matthew Love   DOB:  1951-05-20   MRN:  [id number]  PCP:  Burnard Bunting, MD  Primary Cardiologist:  Dr. Liam Rogers  Primary Electrophysiologist:  None    Notes prior to 2013:  Matthew Love is a 71 y.o. male with a  history of HTN, DM2, HL, GERD, PAF on anticoagulation with Eliquis and sleep apnea.  He has ASCAD and had stenting of his proximal/mid LAD and stenting of his distal  in October, 2017.[name] He is noncompliant with CPAP.   Echo 11/11: EF 98-33%, grade 1 diastolic dysfunction, mild LAE.  ETT-Myoview 11/11: Inferior fixed defect likely due to diaphragmatic attenuation with normal wall motion, EF 69%.  Leane Call 2021 with fixed inferior defect due to diaphragmatic attenuation Echo 7/23:  EF 55-60%  Seen by Dr. Acie Fredrickson a month ago with complaints of over the last 2-3 months, worsening left-sided chest pain. He could point to it with one finger. He did not need any exertional chest pain. He notes it only at rest. He denied any radiating symptoms or associated nausea, diaphoresis. He did not need associated shortness of breath. He does have dyspnea with more extreme activities. He did not ride orthopnea, PND or significant pedal edema. He also had a cough over last several months that was productive of clear sputum. He was prescribed a Z-Pak at one point with his primary care provider.    He was diagnosed with new onset atrial fibrillation on June 11, 2022 and seen at [facility] and was started on Eliquis.  He was back to sinus rhythm before he left.  2D echo showed normal LV function.  He was maintaining normal sinus rhythm at that office visit.  He is here today now with complaints of shortness of breath and diaphoresis.  He has been having episodes of SOB and diaphoresis for the past week.  He tells me that when he gets up moving  around he gets very SOB and breaks out in a sweat.  He was in the shower yesterday and had to lean up against the wall because he was so SOB.  He denies any LE edema.  Over the weekend he had some sharp pains in the middle of his chest. He denies any palpitations or syncope.  He also has been having dizziness when he stands up at times. He denies any fever or chills or cough.   Wt Readings from Last 3 Encounters:  07/30/22 257 lb 3.2 oz (116.7 kg)  06/11/22 272 lb 3.2 oz (123.5 kg)  02/21/22 268 lb 12.8 oz (121.9 kg)     Past Medical History:  Diagnosis Date   Anxiety    Arthritis    Coronary artery disease    a. NST 08/2016: high-risk w/ inferior and lateral wall ischemia  b. cath 09/2016: 80% mid-LAD and 90% distal LAD stenosis. Placement of 2 DES. Mild nonobstructive dz along RCA and LCx.   Diabetes mellitus    A1C 6 weeks ago as of 09/08/14 was 6.2. Not on oral meds.   GERD (gastroesophageal reflux disease)    H/O cardiovascular stress test    ETT-Myoview 11/11: Inferior fixed defect likely due to diaphragmatic attenuation with normal wall motion, EF 69%   H/O echocardiogram    Echo 11/11: EF 82-50%, grade 1 diastolic dysfunction, mild LAE   H/O hiatal hernia  Hyperlipidemia    Hypertension    Mini stroke    Obesity    Pneumonia    hx   Sleep apnea    cpap 1 yr   Stroke [facility]) 10    Current Outpatient Medications  Medication Sig Dispense Refill   aspirin 81 MG tablet Take 81 mg by mouth daily.     atorvastatin (LIPITOR) 40 MG tablet TAKE ONE TABLET BY MOUTH ONCE DAILY AT  6  PM 30 tablet 6   clopidogrel (PLAVIX) 75 MG tablet Take 1 tablet by mouth once daily 90 tablet 3   ELIQUIS 5 MG TABS tablet Take 1 tablet (5 mg total) by mouth 2 (two) times daily. 60 tablet 1   EPINEPHrine (EPIPEN) 0.3 mg/0.3 mL SOAJ injection Inject 0.3 mLs (0.3 mg total) into the muscle once. For signs or symptoms of anaphylaxis. 1 Device 0   escitalopram (LEXAPRO) 10 MG tablet Take 10 mg by mouth  daily.      irbesartan (AVAPRO) 75 MG tablet Take 1 tablet (75 mg total) by mouth daily. 90 tablet 3   metoprolol tartrate (LOPRESSOR) 25 MG tablet Take 12.5 mg by mouth 2 (two) times daily.     Multiple Vitamin (MULTIVITAMIN) tablet Take 1 tablet by mouth daily.     nitroGLYCERIN (NITROSTAT) 0.4 MG SL tablet Place 1 tablet (0.4 mg total) under the tongue every 5 (five) minutes as needed for chest pain. 25 tablet 6   OMEGA 3 1200 MG CAPS Take 1,200 mg by mouth daily.     pantoprazole (PROTONIX) 40 MG tablet Take 40 mg by mouth 2 (two) times daily.     triamterene-hydrochlorothiazide (MAXZIDE-25) 37.5-25 MG tablet Take 1 tablet by mouth daily. 90 tablet 3   No current facility-administered medications for this visit.    Allergies: Allergies  Allergen Reactions   Bee Venom Anaphylaxis, Itching and Swelling   Bee Pollen    Oxycodone-Acetaminophen     Other reaction(s): Unknown   Hydrocodone-Acetaminophen Rash    Social History   Tobacco Use   Smoking status: Former    Packs/day: 3.50    Years: 20.00    Total pack years: 70.00    Types: Cigarettes    Quit date: 12/02/1984    Years since quitting: 37.6   Smokeless tobacco: Never  Vaping Use   Vaping Use: Never used  Substance Use Topics   Alcohol use: No    Comment: quit 85   Drug use: No     ROS:  Please see the history of present illness.     All other systems reviewed and negative.    Physical Exam: Blood pressure 114/76, pulse 73, height '6\' 2"'$  (1.88 m), weight 257 lb 3.2 oz (116.7 kg), SpO2 98 %.  GEN: Well nourished, well developed in no acute distress HEENT: Normal NECK: No JVD; No carotid bruits LYMPHATICS: No lymphadenopathy CARDIAC:RRR, no murmurs, rubs, gallops RESPIRATORY:  Clear to auscultation without rales, wheezing or rhonchi  ABDOMEN: Soft, non-tender, non-distended MUSCULOSKELETAL:  No edema; No deformity  SKIN: Warm and dry NEUROLOGIC:  Alert and oriented x 3 PSYCHIATRIC:  Normal affect    EKG:       EKG today demonstrates NSR with inferior infarct and no ST changes unchanged from 06/2022  ASSESSMENT AND PLAN:  PAF -Diagnosed with atrial fibrillation June 11, 2022 seen at [facility] placed on anticoagulation and converted to sinus rhythm before leaving.  He was maintaining sinus rhythm and [place] office visit a month ago  with Dr. Acie Fredrickson  -CHADS2VASC is  74  ( age 16, cad, HTN) -Stop aspirin since he is on Eliquis and Plavix -he is maintaining NSR on exam today -Continue prescription drug management with Eliquis 5 mg twice daily with as needed refills  2.    CAD :      -S/p stenting of prox / mid LAD and distal LAD in OCt. 2017 -he has not had any anginal chest pain -Continue prescription drug management with Plavix 75 mg daily, Lopressor 12.5 mg twice daily and atorvastatin 40 mg daily with as needed refills  3.   Hypertension:    -BP is controlled on exam today  -Continue prescription drug management with irbesartan 75 mg daily and Lopressor 12.5 mg twice daily as well as Maxide   4.  Hyperlipidemia:  -LDL goal less than 70 -Continue prescription drug management with atorvastatin 40 mg daily with as needed refills  5.  DOE -this has become severe over the past week with any exertion>>He appears very comfortable int he office today>>O2 sat 98% on RA today -recent 2D echo showed normal LVF and his is maintaining NSR on exam -I will get a BNP, CBC, TSH, ddimer and BMET today -check PA and Lat Cxray -set up for Lexiscan myoview to rule out ischemia -Shared Decision Making/Informed Consent The risks [chest pain, shortness of breath, cardiac arrhythmias, dizziness, blood pressure fluctuations, myocardial infarction, stroke/transient ischemic attack, nausea, vomiting, allergic reaction, radiation exposure, metallic taste sensation and life-threatening complications (estimated to be 1 in 10,000)], benefits (risk stratification, diagnosing coronary artery disease, treatment  guidance) and alternatives of a nuclear stress test were discussed in detail with Matthew Love and he agrees to proceed.  Return to see Dr. Acie Fredrickson in 1-2 weeks   Fransico Him, MD  07/30/2022 11:33 AM    [place],  [place], [place]  [address] Pager [phone] Phone: [phone]; Fax: [phone]

## 2022-07-30 NOTE — Addendum Note (Signed)
Addended by: Fransico Him R on: 07/30/2022 11:55 AM   Modules accepted: Orders

## 2022-07-31 ENCOUNTER — Encounter: Payer: Self-pay | Admitting: Cardiology

## 2022-07-31 ENCOUNTER — Telehealth: Payer: Self-pay

## 2022-07-31 DIAGNOSIS — I2510 Atherosclerotic heart disease of native coronary artery without angina pectoris: Secondary | ICD-10-CM

## 2022-07-31 DIAGNOSIS — I480 Paroxysmal atrial fibrillation: Secondary | ICD-10-CM

## 2022-07-31 DIAGNOSIS — I10 Essential (primary) hypertension: Secondary | ICD-10-CM

## 2022-07-31 LAB — CBC
Hematocrit: 56.1 % — ABNORMAL HIGH (ref 37.5–51.0)
Hemoglobin: 19.3 g/dL — ABNORMAL HIGH (ref 13.0–17.7)
MCH: 29.6 pg (ref 26.6–33.0)
MCHC: 34.4 g/dL (ref 31.5–35.7)
MCV: 86 fL (ref 79–97)
Platelets: 474 10*3/uL — ABNORMAL HIGH (ref 150–450)
RBC: 6.53 x10E6/uL — ABNORMAL HIGH (ref 4.14–5.80)
RDW: 15.3 % (ref 11.6–15.4)
WBC: 15.4 10*3/uL — ABNORMAL HIGH (ref 3.4–10.8)

## 2022-07-31 LAB — BASIC METABOLIC PANEL
BUN/Creatinine Ratio: 23 (ref 10–24)
BUN: 33 mg/dL — ABNORMAL HIGH (ref 8–27)
CO2: 22 mmol/L (ref 20–29)
Calcium: 10.7 mg/dL — ABNORMAL HIGH (ref 8.6–10.2)
Chloride: 100 mmol/L (ref 96–106)
Creatinine, Ser: 1.46 mg/dL — ABNORMAL HIGH (ref 0.76–1.27)
Glucose: 157 mg/dL — ABNORMAL HIGH (ref 70–99)
Potassium: 4.5 mmol/L (ref 3.5–5.2)
Sodium: 142 mmol/L (ref 134–144)
eGFR: 51 mL/min/{1.73_m2} — ABNORMAL LOW (ref >59)

## 2022-07-31 LAB — PRO B NATRIURETIC PEPTIDE: NT-Pro BNP: 1515 pg/mL — ABNORMAL HIGH (ref 0–376)

## 2022-07-31 LAB — TSH: TSH: 3.37 u[IU]/mL (ref 0.450–4.500)

## 2022-07-31 MED ORDER — FUROSEMIDE 40 MG PO TABS: 40.0000 mg | ORAL_TABLET | Freq: Every day | ORAL | 3 refills | Status: DC

## 2022-07-31 NOTE — Telephone Encounter (Signed)
-----   Message from Sueanne Margarita, MD sent at 07/31/2022  8:43 AM EDT ----- Serum creatinine is slightly up and BNP is elevated at 1500.  I suspect he is got acute diastolic CHF and renal function should improve with diuretics.  Please have him stop Maxide and start Lasix 40 mg take 2 tablets twice daily for 2 days then down to 1 tablet daily.  I would like him seen by the DOD on Friday as well as a Bmet at that time

## 2022-07-31 NOTE — Progress Notes (Signed)
Office Visit    Patient Name: Matthew Love Date of Encounter: 08/02/2022  Primary Care Provider:  Burnard Bunting, MD Primary Cardiologist:  Mertie Moores, MD Primary Electrophysiologist: None  Chief Complaint    LEOCADIO HEAL is a 71 y.o. male with PMH of CAD s/p PCI of LAD with DES x2 in 2017, HTN, HLD, GERD, DM type II, OSA who presents today for follow-up of coronary artery disease.  Past Medical History    Past Medical History:  Diagnosis Date   Anxiety    Arthritis    Coronary artery disease    a. NST 08/2016: high-risk w/ inferior and lateral wall ischemia  b. cath 09/2016: 80% mid-LAD and 90% distal LAD stenosis. Placement of 2 DES. Mild nonobstructive dz along RCA and LCx.   Diabetes mellitus    A1C 6 weeks ago as of 09/08/14 was 6.2. Not on oral meds.   GERD (gastroesophageal reflux disease)    H/O cardiovascular stress test    ETT-Myoview 11/11: Inferior fixed defect likely due to diaphragmatic attenuation with normal wall motion, EF 69%   H/O echocardiogram    Echo 11/11: EF 27-51%, grade 1 diastolic dysfunction, mild LAE   H/O hiatal hernia    Hyperlipidemia    Hypertension    Mini stroke    Obesity    Pneumonia    hx   Sleep apnea    cpap 1 yr   Stroke [facility]) 10   Past Surgical History:  Procedure Laterality Date   BUNIONECTOMY     CARDIAC CATHETERIZATION N/A 09/06/2016   Procedure: Left Heart Cath and Coronary Angiography;  Surgeon: Burnell Blanks, MD;  Location: [place] CV [facility];  Service: Cardiovascular;  Laterality: N/A;   CARDIAC CATHETERIZATION N/A 09/06/2016   Procedure: Coronary Stent Intervention;  Surgeon: Burnell Blanks, MD;  Location: [place] [facility];  Service: Cardiovascular;  Laterality: N/A;   COLONOSCOPY  2011   CORONARY STENT PLACEMENT  09/06/2016   Severe stenosis mid LAD, s/p successful PTCA/DES x 1   FOOT SURGERY Left    bunion   Hydrocell Removal     KNEE SURGERY Left    arthroscopy   NASAL  SEPTOPLASTY W/ TURBINOPLASTY N/A 09/08/2014   Procedure: NASAL SEPTOPLASTY WITH TURBINATE REDUCTION;  Surgeon: [name], MD;  Location: [facility] OR;  Service: ENT;  Laterality: N/A;   TONSILLECTOMY      Allergies  Allergies  Allergen Reactions   Bee Venom Anaphylaxis, Itching and Swelling   Bee Pollen    Oxycodone-Acetaminophen     Other reaction(s): Unknown   Hydrocodone-Acetaminophen Rash    History of Present Illness    Matthew Love is a 71 year old male with the above-mentioned past medical history who presents today for follow-up of coronary artery disease.  Patient was initially seen in 2013 by Dr. Harrington Challenger for complaint of chest pain.  Lexiscan Myoview was performed that was normal.  Patient was started on PPI for chest discomfort that improved.  He presented to the ED in 2017 for left-sided chest pain that awakened him from sleep.  LHC was performed that revealed disease in proximal mid LAD that was stented with DES x2, and nonobstructive disease along the RCA and left circumflex.  Patient had repeat Lexiscan Myoview for complaint of chest pain and 01/2020 that revealed low risk study.  He presented to [facility] ED with new onset atrial fibrillation and was started on Eliquis.  He was seen by Dr. Acie Fredrickson in  06/2022 with complaint of chest pain with or without exertion.  He was back in sinus rhythm during visit and 2D echo showed normal LV function.  He was seen most recently by Dr. Radford Pax on 8/29 for complaint of shortness of breath with diaphoresis.  This been occurring over the past week and patient denied any lower extremity edema.  Due to patient's history he was set up for [place] to rule out possible ischemia for cause of dyspnea on exertion.  [name] presents today for follow-up with his wife.  Since last being seen in the office patient reports that his diaphoresis and chest pain resolved spontaneously on 8/29.  Patient is euvolemic on examination with trace  edema in lower extremities.  He endorses some sodium indiscretions but is working on eliminating excess salt in diet.  His wife also has heart failure and is a good advocate for maintaining correct lifestyle.  Today [name] had Lexiscan Myoview but results were not available for review during appointment.  Patient denies chest pain, palpitations, dyspnea, PND, orthopnea, nausea, vomiting, dizziness, syncope, edema, weight gain, or early satiety.  Home Medications    Current Outpatient Medications  Medication Sig Dispense Refill   atorvastatin (LIPITOR) 40 MG tablet TAKE ONE TABLET BY MOUTH ONCE DAILY AT  6  PM 30 tablet 6   clopidogrel (PLAVIX) 75 MG tablet Take 1 tablet by mouth once daily 90 tablet 3   ELIQUIS 5 MG TABS tablet Take 1 tablet (5 mg total) by mouth 2 (two) times daily. 60 tablet 1   EPINEPHrine (EPIPEN) 0.3 mg/0.3 mL SOAJ injection Inject 0.3 mLs (0.3 mg total) into the muscle once. For signs or symptoms of anaphylaxis. 1 Device 0   escitalopram (LEXAPRO) 10 MG tablet Take 10 mg by mouth daily.      furosemide (LASIX) 40 MG tablet Take 1 tablet (40 mg total) by mouth daily. 90 tablet 3   irbesartan (AVAPRO) 75 MG tablet Take 1 tablet (75 mg total) by mouth daily. 90 tablet 3   metoprolol tartrate (LOPRESSOR) 25 MG tablet Take 12.5 mg by mouth 2 (two) times daily.     Multiple Vitamin (MULTIVITAMIN) tablet Take 1 tablet by mouth daily.     nitroGLYCERIN (NITROSTAT) 0.4 MG SL tablet Place 1 tablet (0.4 mg total) under the tongue every 5 (five) minutes as needed for chest pain. 25 tablet 6   OMEGA 3 1200 MG CAPS Take 1,200 mg by mouth daily.     pantoprazole (PROTONIX) 40 MG tablet Take 40 mg by mouth 2 (two) times daily.     No current facility-administered medications for this visit.     Review of Systems  Please see the history of present illness.     All other systems reviewed and are otherwise negative except as noted above.  Physical Exam    Wt Readings from Last  3 Encounters:  08/02/22 258 lb (117 kg)  07/30/22 257 lb 3.2 oz (116.7 kg)  06/11/22 272 lb 3.2 oz (123.5 kg)   VS: Vitals:   08/02/22 1057  BP: 130/70  Pulse: (!) 57  SpO2: 98%  ,Body mass index is 33.13 kg/m.  Constitutional:      Appearance: Healthy appearance. Not in distress.  Neck:     Vascular: JVD normal.  Pulmonary:     Effort: Pulmonary effort is normal.     Breath sounds: No wheezing. No rales. Diminished in the bases Cardiovascular:     Irregularly irregular. Normal S1.  Normal S2.      Murmurs: There is no murmur.  Edema:    Peripheral edema absent.  Abdominal:     Palpations: Abdomen is soft non tender. There is no hepatomegaly.  Skin:    General: Skin is warm and dry.  Neurological:     General: No focal deficit present.     Mental Status: Alert and oriented to person, place and time.     Cranial Nerves: Cranial nerves are intact.  EKG/LABS/Other Studies Reviewed    ECG personally reviewed by me today -none completed  Risk Assessment/Calculations:    CHA2DS2-VASc Score = 5   This indicates a 7.2% annual risk of stroke. The patient's score is based upon: CHF History: 1 HTN History: 1 Diabetes History: 1 Stroke History: 0 Vascular Disease History: 1 Age Score: 1 Gender Score: 0           Lab Results  Component Value Date   WBC 15.4 (H) 07/30/2022   HGB 19.3 (H) 07/30/2022   HCT 56.1 (H) 07/30/2022   MCV 86 07/30/2022   PLT 474 (H) 07/30/2022   Lab Results  Component Value Date   CREATININE 1.46 (H) 07/30/2022   BUN 33 (H) 07/30/2022   NA 142 07/30/2022   K 4.5 07/30/2022   CL 100 07/30/2022   CO2 22 07/30/2022   Lab Results  Component Value Date   ALT 16 01/15/2021   AST 18 01/14/2020   ALKPHOS 59 01/14/2020   BILITOT 0.8 01/14/2020   Lab Results  Component Value Date   CHOL 129 01/15/2021   HDL 46 01/15/2021   LDLCALC 58 01/15/2021   TRIG 144 01/15/2021   CHOLHDL 2.8 01/15/2021    No results found for:  "HGBA1C"  Assessment & Plan    1.  Coronary artery disease: - CAD s/p PCI of LAD with DES x2 in 2017 -Magazine completed today but not available for review during follow-up appointment -Today patient reports that his chest pain and diaphoresis have resolved -Continue GDMT with Lipitor 40 mg, Plavix 75 mg, patient not on ASA due to Eliquis   2.  Proximal dismal atrial fibrillation: -Today patient is rate controlled at 57 bpm -Patient currently reports no inappropriate bleeding -Continue Eliquis 5 mg twice daily -Continue rate control with metoprolol titrate 12.5 mg twice daily  3.  Hypertension: -Patient's blood pressure today was well controlled at 130/70 -Continue Avapro 75 mg  4.  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 Educated to contact our office for weight gain of 2 lbs overnight or 5 lbs in one week.   5.  Hyperlipidemia: -Patient is currently LDL was 58 at goal of less than 70 -Continue Lipitor 40 mg daily  Disposition: Follow-up with Mertie Moores, MD or APP in 2 weeks  Medication Adjustments/Labs and Tests Ordered: Current medicines are reviewed at length with the patient today.  Concerns regarding medicines are outlined above.   Signed, Mable Fill, Marissa Nestle, NP 08/02/2022, 12:00 PM [place]

## 2022-07-31 NOTE — Addendum Note (Signed)
Addended by: Antonieta Iba on: 07/31/2022 09:29 AM   Modules accepted: Orders

## 2022-07-31 NOTE — Telephone Encounter (Signed)
Per Dr. Radford Pax correct dosage of Lasix should be 40 mg BID for two days, then 40 mg once daily after that.

## 2022-07-31 NOTE — Telephone Encounter (Signed)
The patient's has been notified of the result and verbalized understanding.  All questions (if any) were answered. Antonieta Iba, RN 07/31/2022 8:57 AM  Rx has been sent in for Lasix. Repeat labs have been scheduled. Appointment with APP has been scheduled for Friday as there are no morning DOD slots available and patient would prefer to be seen after his stress test in the AM>

## 2022-08-02 ENCOUNTER — Ambulatory Visit: Payer: Medicare Other | Attending: Cardiology

## 2022-08-02 ENCOUNTER — Encounter: Payer: Self-pay | Admitting: Nurse Practitioner

## 2022-08-02 ENCOUNTER — Ambulatory Visit: Payer: Medicare Other

## 2022-08-02 ENCOUNTER — Ambulatory Visit: Payer: Medicare Other | Admitting: Nurse Practitioner

## 2022-08-02 VITALS — BP 130/70 | HR 57 | Ht 74.0 in | Wt 258.0 lb

## 2022-08-02 DIAGNOSIS — I2510 Atherosclerotic heart disease of native coronary artery without angina pectoris: Secondary | ICD-10-CM | POA: Diagnosis not present

## 2022-08-02 DIAGNOSIS — I10 Essential (primary) hypertension: Secondary | ICD-10-CM

## 2022-08-02 DIAGNOSIS — E782 Mixed hyperlipidemia: Secondary | ICD-10-CM | POA: Insufficient documentation

## 2022-08-02 DIAGNOSIS — I480 Paroxysmal atrial fibrillation: Secondary | ICD-10-CM | POA: Diagnosis present

## 2022-08-02 DIAGNOSIS — R0602 Shortness of breath: Secondary | ICD-10-CM | POA: Insufficient documentation

## 2022-08-02 LAB — MYOCARDIAL PERFUSION IMAGING
LV dias vol: 91 mL (ref 62–150)
LV sys vol: 37 mL
Nuc Stress EF: 60 %
Peak HR: 78 {beats}/min
Rest HR: 55 {beats}/min
Rest Nuclear Isotope Dose: 10.3 mCi
SDS: 3
SRS: 0
SSS: 3
ST Depression (mm): 0 mm
Stress Nuclear Isotope Dose: 30.4 mCi
TID: 1.02

## 2022-08-02 LAB — CBC

## 2022-08-02 MED ORDER — TECHNETIUM TC 99M TETROFOSMIN IV KIT
10.3000 | PACK | Freq: Once | INTRAVENOUS | Status: AC | PRN
  Administered 2022-08-02: 10.3 via INTRAVENOUS

## 2022-08-02 MED ORDER — FUROSEMIDE 40 MG PO TABS: 40.0000 mg | ORAL_TABLET | Freq: Every day | ORAL | 3 refills | Status: DC

## 2022-08-02 MED ORDER — REGADENOSON 0.4 MG/5ML IV SOLN
0.4000 mg | Freq: Once | INTRAVENOUS | Status: AC
  Administered 2022-08-02: 0.4 mg via INTRAVENOUS

## 2022-08-02 MED ORDER — TECHNETIUM TC 99M TETROFOSMIN IV KIT
30.4000 | PACK | Freq: Once | INTRAVENOUS | Status: AC | PRN
  Administered 2022-08-02: 30.4 via INTRAVENOUS

## 2022-08-02 NOTE — Patient Instructions (Signed)
Medication Instructions:  Your physician has recommended you make the following change in your medication:  Lasix 40 mg daily  *If you need a refill on your cardiac medications before your next appointment, please call your pharmacy*   Lab Work: BMET on 08/12/2022 If you have labs (blood work) drawn today and your tests are completely normal, you will receive your results only by: [place] (if you have MyChart) OR A paper copy in the mail If you have any lab test that is abnormal or we need to change your treatment, we will call you to review the results.   Follow-Up: At [facility], you and your health needs are our priority.  As part of our continuing mission to provide you with exceptional heart care, we have created designated Provider Care Teams.  These Care Teams include your primary Cardiologist (physician) and Advanced Practice Providers (APPs -  Physician Assistants and Nurse Practitioners) who all work together to provide you with the care you need, when you need it.   Your next appointment:   08/12/2022 at [facility]  The format for your next appointment:   In Person  Provider:   Mertie Moores, MD

## 2022-08-03 LAB — CBC
Hematocrit: 45.3 % (ref 37.5–51.0)
Hemoglobin: 15.4 g/dL (ref 13.0–17.7)
MCH: 29.4 pg (ref 26.6–33.0)
MCHC: 34 g/dL (ref 31.5–35.7)
MCV: 87 fL (ref 79–97)
Platelets: 255 10*3/uL (ref 150–450)
RBC: 5.24 x10E6/uL (ref 4.14–5.80)
RDW: 13.7 % (ref 11.6–15.4)
WBC: 8.1 10*3/uL (ref 3.4–10.8)

## 2022-08-03 LAB — BASIC METABOLIC PANEL
BUN/Creatinine Ratio: 17 (ref 10–24)
BUN: 18 mg/dL (ref 8–27)
CO2: 27 mmol/L (ref 20–29)
Calcium: 9.4 mg/dL (ref 8.6–10.2)
Chloride: 100 mmol/L (ref 96–106)
Creatinine, Ser: 1.07 mg/dL (ref 0.76–1.27)
Glucose: 143 mg/dL — ABNORMAL HIGH (ref 70–99)
Potassium: 3.8 mmol/L (ref 3.5–5.2)
Sodium: 141 mmol/L (ref 134–144)
eGFR: 74 mL/min/{1.73_m2} (ref >59)

## 2022-08-05 ENCOUNTER — Encounter: Payer: Self-pay | Admitting: Cardiovascular Disease

## 2022-08-11 ENCOUNTER — Encounter: Payer: Self-pay | Admitting: Cardiovascular Disease

## 2022-08-11 NOTE — Progress Notes (Unsigned)
Matthew Love, [place]  [address] Phone: [phone] Fax:  [phone]  Date:  08/12/2022   Name:  Matthew Love   DOB:  [birth year]   MRN:  [id number]  PCP:  Burnard Bunting, MD  Primary Cardiologist:  Dr. Liam Rogers  Primary Electrophysiologist:  None    Notes prior to 2013:  Matthew Love is a 71 y.o. male who returns for evaluation of chest pain.  He has a history of HTN, DM2, HL, GERD and sleep apnea. He is noncompliant with CPAP.   Echo 11/11: EF 32-99%, grade 1 diastolic dysfunction, mild LAE.  ETT-Myoview 11/11: Inferior fixed defect likely due to diaphragmatic attenuation with normal wall motion, EF 69%.   Over the last 2-3 months, he has noted worsening left-sided chest pain. He can point to it with one finger. It feels dull. He denies any exertional chest pain. He notes it only at rest. He denies any radiating symptoms or associated nausea, diaphoresis. He denies associated shortness of breath. He does have dyspnea with more extreme activities. He denies orthopnea, PND or significant pedal edema. He has had a cough over last several months. This is productive of clear sputum. He was prescribed a Z-Pak at one point with his primary care provider. He denies any recent travels, injuries to his lower extremities or recent hospitalizations.  He denies pleuritic chest pain or chest pain with lying supine.  Sept. 17, 2013 -   He was seen by [name] in August for chest pain. A stress Myoview study was normal. He increase his Nexium and the mild chest discomfort improved.  Sept. 19, 2016:  Doing well  . Retired from [employer]   Exercising  Some .   Walking 30 minutes every AM.   His foot and toes on left side get very cold   Sept. 21, 2017:  Matthew Love is seen today for a work in visit Had some chest pain  Center of chest.    Seemed to be worse when he put his hand on his chest  Awoke last night with some left sided chest pain   Usually occurs when he is lying down. Walking 20 minutes a day -  Has gained about 10 lbs over the past month    Nov. 20, 2017:    Matthew Love is seen back .  He had stenting of his proximal/mid LAD and stenting of his distal  in October, 2017. [name] Since that time he's had some lightheadedness / dizziness. Had a GI bug this past weekend.   Had some diarrhea and poor appetite.   He has improved his diet. Has lost weight - 25-30 lbs since his the stent procedure .  Is avoiding salt.    Feb. 26, 2018:  Has lost another 40 lbs. Is watching his diet  No CP .   Oct. 1, 2018:    Doing well.   No angina ,  Has MSK aches ,  No pain similar to her previous angina   , no dyspnea.  Not exercising as much.  Having some back issues.  January 09, 2018:  [name] is doing well  No CP  Has not been sleeping well Has been having some chest pain  Has lots of dizziness.   Wife [name] thinks it might be due to Imdur  Wants to start walking more  Retired from teaching   Apr 16, 2018:   No further episodes of CP  Walks 30 minutes  5 days a week.   Has been gaining weight  - 7 lbs since lst Nov. 2018  Nov. 13, 2019: No   dyspnea Had 1 episode of dizziness while walking  Had an episode of CP in Sept - went to the ER Work up was negative.  Has not recurred.  Found nothing wrong Has gained weight.  Knows he has not been eating right .   exercising regularly.     Feb. 12, 2021: Matthew Love is seen today for a work in visit because of some episodes of chest discomfort. Does not know if these are similar to his previous episodes  He has a history of coronary artery disease.  He had a stent placed to the proximal to mid LAD in October, 2017.  He also had a distal LAD stent at that same time. Has occasional CP ,  Better after he took several aspirin   Is having some right leg pain   Worse after he has been sitting down .   Is not worsened with exertion.   Sounds more like radicular pain .     Feb. 14,  2022: Matthew Love is seen today for follow up visit for his CAD No angina .   Has some DOE ,  Has stopped walking and realizes that he needs to get back into exercising  Wt. Is 267 lbs   June 11, 2022 Matthew Love is seen as a work in visit today for new onset atrial fib Has had progressive swelling of his legs. Was seen in the [facility] ER and was found to have atrial fib  He has been started on Eliquis   Has has leg swelling ,  we initially prescribed lasix but he never took it  [facility] Left leg is red and swollen   Had near syncope ,  felt that his HR   Went to [place] IV potassium  Maxdize was stopped   Was found to have atrial fib in [place].  Converted back to NSR before he left  Has converted back to NSR as of today   His current symptoms do not feel  like his  Has OSA , does not wear his CPAP mask  Still eating lots of processed meats    Sept. 11, 2023 Matthew Love is seen for follow up  He has continued to have significant dyspnea  ? Still eating processed meats / foods?  He was seen by Dr. Radford Pax as a DOD work in on aug. 29, 2023 - non compliant with CPAP Leane Call on Sept. 1, 2023 showed no ischemia. Normal LV function Low risk  , normal Echo  June 25, 2022 - normal LV systolic function  85-27% No significant valvular disease   Was seen by Madaline Guthrie on Sept. 1, 2023 Lasix was increased to 40 BID for for 3 days   He became hypotensive We held lasix for a day    Wt is 264 lbs      Wt Readings from Last 3 Encounters:  08/12/22 264 lb 12.8 oz (120.1 kg)  08/02/22 258 lb (117 kg)  07/30/22 257 lb 3.2 oz (116.7 kg)     Past Medical History:  Diagnosis Date   Anxiety    Arthritis    Coronary artery disease    a. NST 08/2016: high-risk w/ inferior and lateral wall ischemia  b. cath 09/2016: 80% mid-LAD and 90% distal LAD stenosis. Placement of 2 DES. Mild nonobstructive dz along RCA and  LCx.   Diabetes mellitus    A1C 6 weeks  ago as of 09/08/14 was 6.2. Not on oral meds.   GERD (gastroesophageal reflux disease)    H/O cardiovascular stress test    ETT-Myoview 11/11: Inferior fixed defect likely due to diaphragmatic attenuation with normal wall motion, EF 69%   H/O echocardiogram    Echo 11/11: EF 12-87%, grade 1 diastolic dysfunction, mild LAE   H/O hiatal hernia    Hyperlipidemia    Hypertension    Mini stroke    Obesity    Pneumonia    hx   Sleep apnea    cpap 1 yr   Stroke [facility]) 10    Current Outpatient Medications  Medication Sig Dispense Refill   atorvastatin (LIPITOR) 40 MG tablet TAKE ONE TABLET BY MOUTH ONCE DAILY AT  6  PM 30 tablet 6   clopidogrel (PLAVIX) 75 MG tablet Take 1 tablet by mouth once daily 90 tablet 3   ELIQUIS 5 MG TABS tablet Take 1 tablet (5 mg total) by mouth 2 (two) times daily. 60 tablet 1   EPINEPHrine (EPIPEN) 0.3 mg/0.3 mL SOAJ injection Inject 0.3 mLs (0.3 mg total) into the muscle once. For signs or symptoms of anaphylaxis. 1 Device 0   escitalopram (LEXAPRO) 10 MG tablet Take 10 mg by mouth daily.      furosemide (LASIX) 40 MG tablet Take 1 tablet (40 mg total) by mouth daily. 90 tablet 3   irbesartan (AVAPRO) 75 MG tablet Take 1 tablet (75 mg total) by mouth daily. 90 tablet 3   metoprolol tartrate (LOPRESSOR) 25 MG tablet Take 12.5 mg by mouth 2 (two) times daily.     Multiple Vitamin (MULTIVITAMIN) tablet Take 1 tablet by mouth daily.     nitroGLYCERIN (NITROSTAT) 0.4 MG SL tablet Place 1 tablet (0.4 mg total) under the tongue every 5 (five) minutes as needed for chest pain. 25 tablet 6   OMEGA 3 1200 MG CAPS Take 1,200 mg by mouth daily.     pantoprazole (PROTONIX) 40 MG tablet Take 40 mg by mouth 2 (two) times daily.     triamterene-hydrochlorothiazide (DYAZIDE) 37.5-25 MG capsule Take 1 capsule by mouth daily.     No current facility-administered medications for this visit.    Allergies: Allergies  Allergen Reactions   Bee Venom Anaphylaxis, Itching and  Swelling   Bee Pollen    Oxycodone-Acetaminophen     Other reaction(s): Unknown   Hydrocodone-Acetaminophen Rash    Social History   Tobacco Use   Smoking status: Former    Packs/day: 3.50    Years: 20.00    Total pack years: 70.00    Types: Cigarettes    Quit date: 12/02/1984    Years since quitting: 37.7   Smokeless tobacco: Never  Vaping Use   Vaping Use: Never used  Substance Use Topics   Alcohol use: No    Comment: quit 85   Drug use: No     ROS:  Please see the history of present illness.     All other systems reviewed and negative.    Physical Exam: Blood pressure 138/72, pulse 63, height 6' 2"  (1.88 m), weight 264 lb 12.8 oz (120.1 kg), SpO2 98 %.    GEN:  morbidly obese male,  NAD  HEENT: Normal NECK: No JVD;  Irreg. irreg RESPIRATORY:  Clear to auscultation without rales, wheezing or rhonchi  ABDOMEN: Soft, non-tender, non-distended MUSCULOSKELETAL:  !+ edema  No deformity  SKIN: Warm  and dry NEUROLOGIC:  Alert and oriented x 3   EKG:        ASSESSMENT AND PLAN:   Atrial Fib:  new diagnosis of Atrial fib CHADS2VASC is  17  ( age 35, cad, HTN) Cont current meds.      2.    CAD :     S/p stenting of prox / mid LAD and distal LAD in OCt. 2017 No angina      3.   Hypertension:    BP is well controlled.  Over the past several months he has had quite variable blood pressure.  It turns out that his diet has been very variable.  He typically eats lots of salty foods and then increase his medications/diuretics.  I have strongly encouraged him to    4.  Hyperlipidemia: - needs much better diet, weight loss    5.  Acute on chronic diastolic CHF:   he eats an unrestricted diet,   does not wear his CPAP,  Makes little effort to help himself.   Says he will start eating better soon .   Ive reminded him that doctors can only do so much, that he will have to greatly improve his lifestyle in order to feel better    Mertie Moores, MD  08/12/2022 2:12 PM     [address],  [place], [place]  [address] Pager [phone] Phone: [phone]; Fax: [phone]

## 2022-08-12 ENCOUNTER — Encounter: Payer: Self-pay | Admitting: Cardiovascular Disease

## 2022-08-12 ENCOUNTER — Ambulatory Visit: Payer: Medicare Other | Attending: Cardiovascular Disease | Admitting: Cardiovascular Disease

## 2022-08-12 ENCOUNTER — Ambulatory Visit: Payer: Medicare Other

## 2022-08-12 VITALS — BP 138/72 | HR 63 | Ht 74.0 in | Wt 264.8 lb

## 2022-08-12 DIAGNOSIS — G4733 Obstructive sleep apnea (adult) (pediatric): Secondary | ICD-10-CM | POA: Diagnosis not present

## 2022-08-12 DIAGNOSIS — I10 Essential (primary) hypertension: Secondary | ICD-10-CM | POA: Diagnosis not present

## 2022-08-12 DIAGNOSIS — I5032 Chronic diastolic (congestive) heart failure: Secondary | ICD-10-CM | POA: Diagnosis not present

## 2022-08-12 DIAGNOSIS — I2510 Atherosclerotic heart disease of native coronary artery without angina pectoris: Secondary | ICD-10-CM | POA: Diagnosis not present

## 2022-08-12 NOTE — Patient Instructions (Signed)
Medication Instructions:  Your physician recommends that you continue on your current medications as directed. Please refer to the Current Medication list given to you today.  *If you need a refill on your cardiac medications before your next appointment, please call your pharmacy*  Follow-Up: At [facility], you and your health needs are our priority.  As part of our continuing mission to provide you with exceptional heart care, we have created designated Provider Care Teams.  These Care Teams include your primary Cardiologist (physician) and Advanced Practice Providers (APPs -  Physician Assistants and Nurse Practitioners) who all work together to provide you with the care you need, when you need it.  We recommend signing up for the patient portal called "MyChart".  Sign up information is provided on this After Visit Summary.  MyChart is used to connect with patients for Virtual Visits (Telemedicine).  Patients are able to view lab/test results, encounter notes, upcoming appointments, etc.  Non-urgent messages can be sent to your provider as well.   To learn more about what you can do with MyChart, go to [email or web address].    Your next appointment:   6 month(s)  The format for your next appointment:   In Person  Provider:   Mertie Moores, MD      Important Information About Sugar

## 2022-08-13 LAB — BASIC METABOLIC PANEL
BUN/Creatinine Ratio: 14 (ref 10–24)
BUN: 15 mg/dL (ref 8–27)
CO2: 24 mmol/L (ref 20–29)
Calcium: 9.3 mg/dL (ref 8.6–10.2)
Chloride: 104 mmol/L (ref 96–106)
Creatinine, Ser: 1.04 mg/dL (ref 0.76–1.27)
Glucose: 114 mg/dL — ABNORMAL HIGH (ref 70–99)
Potassium: 3.5 mmol/L (ref 3.5–5.2)
Sodium: 144 mmol/L (ref 134–144)
eGFR: 77 mL/min/{1.73_m2} (ref >59)

## 2022-09-02 ENCOUNTER — Encounter: Payer: Self-pay | Admitting: Cardiovascular Disease

## 2022-09-03 ENCOUNTER — Other Ambulatory Visit: Payer: Self-pay | Admitting: Cardiovascular Disease

## 2022-09-03 DIAGNOSIS — I480 Paroxysmal atrial fibrillation: Secondary | ICD-10-CM

## 2022-09-03 MED ORDER — METOPROLOL TARTRATE 25 MG PO TABS: 12.5000 mg | ORAL_TABLET | Freq: Two times a day (BID) | ORAL | 3 refills | Status: DC

## 2022-09-04 ENCOUNTER — Ambulatory Visit: Payer: Medicare Other | Admitting: Physician Assistant

## 2022-09-04 NOTE — Telephone Encounter (Signed)
Eliquis '5mg'$  refill request received. Patient is 71 years old, weight-120.1kg, Crea-1.04 on 08/12/2022, Diagnosis-Afib, and last seen by Dr. Acie Fredrickson on 08/12/2022. Dose is appropriate based on dosing criteria. Will send in refill to requested pharmacy.

## 2022-09-14 ENCOUNTER — Encounter: Payer: Self-pay | Admitting: Cardiovascular Disease

## 2022-09-25 ENCOUNTER — Ambulatory Visit (HOSPITAL_BASED_OUTPATIENT_CLINIC_OR_DEPARTMENT_OTHER): Payer: Medicare Other | Attending: Cardiovascular Disease | Admitting: Cardiology

## 2022-09-25 DIAGNOSIS — I2510 Atherosclerotic heart disease of native coronary artery without angina pectoris: Secondary | ICD-10-CM | POA: Diagnosis not present

## 2022-09-25 DIAGNOSIS — I509 Heart failure, unspecified: Secondary | ICD-10-CM | POA: Diagnosis not present

## 2022-09-25 DIAGNOSIS — E119 Type 2 diabetes mellitus without complications: Secondary | ICD-10-CM | POA: Insufficient documentation

## 2022-09-25 DIAGNOSIS — R0683 Snoring: Secondary | ICD-10-CM | POA: Insufficient documentation

## 2022-09-25 DIAGNOSIS — I110 Hypertensive heart disease with heart failure: Secondary | ICD-10-CM | POA: Diagnosis not present

## 2022-09-25 DIAGNOSIS — G4733 Obstructive sleep apnea (adult) (pediatric): Secondary | ICD-10-CM | POA: Diagnosis present

## 2022-09-25 DIAGNOSIS — G459 Transient cerebral ischemic attack, unspecified: Secondary | ICD-10-CM | POA: Diagnosis not present

## 2022-09-25 DIAGNOSIS — R5383 Other fatigue: Secondary | ICD-10-CM | POA: Insufficient documentation

## 2022-09-25 DIAGNOSIS — I10 Essential (primary) hypertension: Secondary | ICD-10-CM

## 2022-09-25 DIAGNOSIS — E669 Obesity, unspecified: Secondary | ICD-10-CM | POA: Diagnosis not present

## 2022-09-26 NOTE — Procedures (Signed)
   Patient Name: Matthew Love, Matthew Love Date:09/25/2022 Gender: Male D.O.B: [birth year] Age (years): 17 Referring Provider: Mertie Moores Height (inches): 74 Interpreting Physician: Fransico Him MD, ABSM Weight (lbs): 246 RPSGT: Jorge Ny BMI: 32 MRN: [id number] Neck Size: 17.00  CLINICAL INFORMATION Sleep Study Type: NPSG  Indication for sleep study: Congestive Heart Failure, Diabetes, Fatigue, Hypertension, Obesity, OSA, Re-Evaluation, Snoring  Epworth Sleepiness Score: 2  SLEEP STUDY TECHNIQUE As per the AASM Manual for the Scoring of Sleep and Associated Events v2.3 (April 2016) with a hypopnea requiring 4% desaturations.  The channels recorded and monitored were frontal, central and occipital EEG, electrooculogram (EOG), submentalis EMG (chin), nasal and oral airflow, thoracic and abdominal wall motion, anterior tibialis EMG, snore microphone, electrocardiogram, and pulse oximetry.  MEDICATIONS Medications self-administered by patient taken the night of the study : N/A  SLEEP ARCHITECTURE The study was initiated at 10:12:29 PM and ended at 4:56:35 AM.  Sleep onset time was 28.9 minutes and the sleep efficiency was 78.2%. The total sleep time was 316 minutes.  Stage REM latency was 325.0 minutes.  The patient spent 4.9% of the night in stage N1 sleep, 94.5% in stage N2 sleep, 0.0% in stage N3 and 0.6% in REM.  Alpha intrusion was absent.  Supine sleep was 0.00%.  RESPIRATORY PARAMETERS The overall apnea/hypopnea index (AHI) was 4.7 per hour. There were 0 total apneas, including 0 obstructive, 0 central and 0 mixed apneas. There were 25 hypopneas and 22 RERAs.  The AHI during Stage REM sleep was 0.0 per hour.  AHI while supine was N/A per hour.  The mean oxygen saturation was 93.0%. The minimum SpO2 during sleep was 89.0%.  snoring was noted during this study.  CARDIAC DATA The 2 lead EKG demonstrated sinus rhythm. The mean heart rate was 49.5 beats per  minute. Other EKG findings include: PVCs.  LEG MOVEMENT DATA The total PLMS were 0 with a resulting PLMS index of 0.0. Associated arousal with leg movement index was 8.9 .  IMPRESSIONS - No significant obstructive sleep apnea occurred during this study (AHI = 4.7/h). - The patient had minimal or no oxygen desaturation during the study (Min O2 = 89.0%) - No snoring was audible during this study. - EKG findings include PVCs. - Clinically significant periodic limb movements did not occur during sleep. Associated arousals were significant.  DIAGNOSIS - Nondiagnostic study due to limited REM sleep  RECOMMENDATIONS - Recommend [facility] Home sleep study - Avoid alcohol, sedatives and other CNS depressants that may worsen sleep apnea and disrupt normal sleep architecture. - Sleep hygiene should be reviewed to assess factors that may improve sleep quality. - Weight management and regular exercise should be initiated or continued if appropriate.  [Electronically signed] 09/26/2022 11:06 PM  Fransico Him MD, ABSM Diplomate, American Board of Sleep Medicine

## 2022-10-09 ENCOUNTER — Telehealth: Payer: Self-pay | Admitting: *Deleted

## 2022-10-09 NOTE — Telephone Encounter (Signed)
-----   Message from Lauralee Evener, [place] sent at 09/27/2022  8:55 AM EDT -----  ----- Message ----- From: Sueanne Margarita, MD Sent: 09/26/2022  11:08 PM EDT To: Cv Div Sleep Studies  No OSA but very little REM sleep so nondiagnostic - please get an itamar home sleep study

## 2022-10-09 NOTE — Telephone Encounter (Signed)
The patient has been notified of the result and verbalized understanding.  All questions (if any) were answered. Marolyn Hammock, [place] 10/09/2022 12:05 PM    Pt is aware of his results and declines to test anymore.

## 2023-01-30 ENCOUNTER — Encounter: Payer: Self-pay | Admitting: Cardiovascular Disease

## 2023-02-11 ENCOUNTER — Ambulatory Visit: Payer: Medicare Other | Admitting: Cardiovascular Disease

## 2023-03-03 ENCOUNTER — Other Ambulatory Visit: Payer: Self-pay | Admitting: Cardiovascular Disease

## 2023-03-03 DIAGNOSIS — I480 Paroxysmal atrial fibrillation: Secondary | ICD-10-CM

## 2023-03-03 NOTE — Telephone Encounter (Signed)
Prescription refill request for Eliquis received. Indication: AF Last office visit: 08/12/22  P Nahser MD Scr: 1.04 on 08/12/22  Epic Age: 72 Weight: 120.1kg  Based on above findings Eliquis 5mg  twice daily is the appropriate dose.  Refill approved.

## 2023-03-13 ENCOUNTER — Ambulatory Visit: Payer: Medicare Other | Admitting: Cardiovascular Disease

## 2023-03-16 ENCOUNTER — Encounter: Payer: Self-pay | Admitting: Cardiovascular Disease

## 2023-03-26 ENCOUNTER — Ambulatory Visit: Payer: Medicare Other | Attending: Cardiovascular Disease | Admitting: Cardiovascular Disease

## 2023-03-26 ENCOUNTER — Encounter: Payer: Self-pay | Admitting: Cardiovascular Disease

## 2023-03-26 VITALS — BP 96/55 | HR 61 | Ht 74.0 in | Wt 257.2 lb

## 2023-03-26 DIAGNOSIS — I5032 Chronic diastolic (congestive) heart failure: Secondary | ICD-10-CM | POA: Diagnosis not present

## 2023-03-26 DIAGNOSIS — I480 Paroxysmal atrial fibrillation: Secondary | ICD-10-CM | POA: Diagnosis not present

## 2023-03-26 DIAGNOSIS — I10 Essential (primary) hypertension: Secondary | ICD-10-CM

## 2023-03-26 NOTE — Patient Instructions (Signed)
Medication Instructions:  Your physician recommends that you continue on your current medications as directed. Please refer to the Current Medication list given to you today.  *If you need a refill on your cardiac medications before your next appointment, please call your pharmacy*   Lab Work: NONE If you have labs (blood work) drawn today and your tests are completely normal, you will receive your results only by: MyChart Message (if you have MyChart) OR A paper copy in the mail If you have any lab test that is abnormal or we need to change your treatment, we will call you to review the results.   Testing/Procedures: NONE   Follow-Up: At [facility], you and your health needs are our priority.  As part of our continuing mission to provide you with exceptional heart care, we have created designated Provider Care Teams.  These Care Teams include your primary Cardiologist (physician) and Advanced Practice Providers (APPs -  Physician Assistants and Nurse Practitioners) who all work together to provide you with the care you need, when you need it.  We recommend signing up for the patient portal called "MyChart".  Sign up information is provided on this After Visit Summary.  MyChart is used to connect with patients for Virtual Visits (Telemedicine).  Patients are able to view lab/test results, encounter notes, upcoming appointments, etc.  Non-urgent messages can be sent to your provider as well.   To learn more about what you can do with MyChart, go to [email or web address].    Your next appointment:   1 year(s)  Provider:   Philip Nahser, MD      

## 2023-03-26 NOTE — Progress Notes (Signed)
[address] [place], [place]  [address] Phone: [phone] Fax:  [phone]  Date:  03/26/2023   Name:  Matthew Love   DOB:  [birth year]   MRN:  [id number]  PCP:  Geoffry Paradise, MD  Primary Cardiologist:  Dr. Delane Ginger  Primary Electrophysiologist:  None    Notes prior to 2013:  Matthew Love is a 72 y.o. male who returns for evaluation of chest pain.  He has a history of HTN, DM2, HL, GERD and sleep apnea. He is noncompliant with CPAP.   Echo 11/11: EF 60-65%, grade 1 diastolic dysfunction, mild LAE.  ETT-Myoview 11/11: Inferior fixed defect likely due to diaphragmatic attenuation with normal wall motion, EF 69%.   Over the last 2-3 months, he has noted worsening left-sided chest pain. He can point to it with one finger. It feels dull. He denies any exertional chest pain. He notes it only at rest. He denies any radiating symptoms or associated nausea, diaphoresis. He denies associated shortness of breath. He does have dyspnea with more extreme activities. He denies orthopnea, PND or significant pedal edema. He has had a cough over last several months. This is productive of clear sputum. He was prescribed a Z-Pak at one point with his primary care provider. He denies any recent travels, injuries to his lower extremities or recent hospitalizations.  He denies pleuritic chest pain or chest pain with lying supine.  Sept. 17, 2013 -   He was seen by Tereso Newcomer in August for chest pain. A stress Myoview study was normal. He increase his Nexium and the mild chest discomfort improved.  Sept. 19, 2016:  Doing well  . Retired from [employer]   Exercising  Some .   Walking 30 minutes every AM.   His foot and toes on left side get very cold   Sept. 21, 2017:  [place] is seen today for a work in visit Had some chest pain  Center of chest.    Seemed to be worse when he put his hand on his chest  Awoke last night with some left sided chest pain   Usually occurs when he is lying down. Walking 20 minutes a day -  Has gained about 10 lbs over the past month    Nov. 20, 2017:    [name] is seen back .  He had stenting of his proximal/mid LAD and stenting of his distal  in October, 2017. [name] Since that time he's had some lightheadedness / dizziness. Had a GI bug this past weekend.   Had some diarrhea and poor appetite.   He has improved his diet. Has lost weight - 25-30 lbs since his the stent procedure .  Is avoiding salt.    Feb. 26, 2018:  Has lost another 40 lbs. Is watching his diet  No CP .   Oct. 1, 2018:    Doing well.   No angina ,  Has MSK aches ,  No pain similar to her previous angina   , no dyspnea.  Not exercising as much.  Having some back issues.  January 09, 2018:  Matthew Love is doing well  No CP  Has not been sleeping well Has been having some chest pain  Has lots of dizziness.   Wife Graciella Belton thinks it might be due to Imdur  Wants to start walking more  Retired from teaching   Apr 16, 2018:   No further episodes of CP  Walks 30 minutes  5 days a week.   Has been gaining weight  - 7 lbs since lst Nov. 2018  Nov. 13, 2019: No   dyspnea Had 1 episode of dizziness while walking  Had an episode of CP in Sept - went to the ER Work up was negative.  Has not recurred.  Found nothing wrong Has gained weight.  Knows he has not been eating right .   exercising regularly.     Feb. 12, 2021: Matthew Love is seen today for a work in visit because of some episodes of chest discomfort. Does not know if these are similar to his previous episodes  He has a history of coronary artery disease.  He had a stent placed to the proximal to mid LAD in October, 2017.  He also had a distal LAD stent at that same time. Has occasional CP ,  Better after he took several aspirin   Is having some right leg pain   Worse after he has been sitting down .   Is not worsened with exertion.   Sounds more like radicular pain .     Feb. 14,  2022: Matthew Love is seen today for follow up visit for his CAD No angina .   Has some DOE ,  Has stopped walking and realizes that he needs to get back into exercising  Wt. Is 267 lbs   June 11, 2022 Matthew Love is seen as a work in visit today for new onset atrial fib Has had progressive swelling of his legs. Was seen in the [facility] ER and was found to have atrial fib  He has been started on Eliquis   Has has leg swelling ,  we initially prescribed lasix but he never took it  [facility] Left leg is red and swollen   Had near syncope ,  felt that his HR   Went to [facility] ER  Received IV potassium  Maxdize was stopped   Was found to have atrial fib in [place].  Converted back to NSR before he left  Has converted back to NSR as of today   His current symptoms do not feel  like his  Has OSA , does not wear his CPAP mask  Still eating lots of processed meats    Sept. 11, 2023 [name] is seen for follow up  He has continued to have significant dyspnea  ? Still eating processed meats / foods?  He was seen by Dr. Mayford Knife as a DOD work in on aug. 29, 2023 - non compliant with CPAP Steffanie Dunn on Sept. 1, 2023 showed no ischemia. Normal LV function Low risk  , normal Echo  June 25, 2022 - normal LV systolic function  50-55% No significant valvular disease   Was seen by Cristi Loron on Sept. 1, 2023 Lasix was increased to 40 BID for for 3 days   He became hypotensive We held lasix for a day    Wt is 264 lbs   March 26, 2023:  Matthew Love is seen today for follow-up of his chronic diastolic congestive heart failure, hyperlipidemia, atrial fibrillation  Sleep study was negative for OSA  Wt is 257 lbs  Is in sinus rhythm Has joined a gym.  Labs from his primary medical doctor from Apr 10, 2022 show a Total cholesterol is 132 HDL is 35 LDL 65 Triglyceride levels 158    Wt Readings from Last 3 Encounters:  03/26/23 257 lb 3.2 oz (116.7 kg)  09/25/22 246 lb  (  111.6 kg)  08/12/22 264 lb 12.8 oz (120.1 kg)     Past Medical History:  Diagnosis Date   Anxiety    Arthritis    Coronary artery disease    a. NST 08/2016: high-risk w/ inferior and lateral wall ischemia  b. cath 09/2016: 80% mid-LAD and 90% distal LAD stenosis. Placement of 2 DES. Mild nonobstructive dz along RCA and LCx.   Diabetes mellitus    A1C 6 weeks ago as of 09/08/14 was 6.2. Not on oral meds.   GERD (gastroesophageal reflux disease)    H/O cardiovascular stress test    ETT-Myoview 11/11: Inferior fixed defect likely due to diaphragmatic attenuation with normal wall motion, EF 69%   H/O echocardiogram    Echo 11/11: EF 60-65%, grade 1 diastolic dysfunction, mild LAE   H/O hiatal hernia    Hyperlipidemia    Hypertension    Mini stroke    Obesity    Pneumonia    hx   Sleep apnea    cpap 1 yr   Stroke 10    Current Outpatient Medications  Medication Sig Dispense Refill   atorvastatin (LIPITOR) 40 MG tablet TAKE ONE TABLET BY MOUTH ONCE DAILY AT  6  PM 30 tablet 6   clopidogrel (PLAVIX) 75 MG tablet Take 1 tablet by mouth once daily 90 tablet 3   ELIQUIS 5 MG TABS tablet Take 1 tablet by mouth twice daily 60 tablet 5   EPINEPHrine (EPIPEN) 0.3 mg/0.3 mL SOAJ injection Inject 0.3 mLs (0.3 mg total) into the muscle once. For signs or symptoms of anaphylaxis. 1 Device 0   escitalopram (LEXAPRO) 10 MG tablet Take 10 mg by mouth daily.      furosemide (LASIX) 40 MG tablet Take 1 tablet (40 mg total) by mouth daily. 90 tablet 3   irbesartan (AVAPRO) 75 MG tablet Take 1 tablet (75 mg total) by mouth daily. 90 tablet 3   metoprolol tartrate (LOPRESSOR) 25 MG tablet Take 0.5 tablets (12.5 mg total) by mouth 2 (two) times daily. 90 tablet 3   Multiple Vitamin (MULTIVITAMIN) tablet Take 1 tablet by mouth daily.     nitroGLYCERIN (NITROSTAT) 0.4 MG SL tablet Place 1 tablet (0.4 mg total) under the tongue every 5 (five) minutes as needed for chest pain. 25 tablet 6   OMEGA 3 1200 MG  CAPS Take 1,200 mg by mouth daily.     pantoprazole (PROTONIX) 40 MG tablet Take 40 mg by mouth 2 (two) times daily.     triamterene-hydrochlorothiazide (DYAZIDE) 37.5-25 MG capsule Take 1 capsule by mouth daily.     No current facility-administered medications for this visit.    Allergies: Allergies  Allergen Reactions   Bee Venom Anaphylaxis, Itching and Swelling   Bee Pollen    Oxycodone-Acetaminophen     Other reaction(s): Unknown   Hydrocodone-Acetaminophen Rash    Social History   Tobacco Use   Smoking status: Former    Packs/day: 3.50    Years: 20.00    Additional pack years: 0.00    Total pack years: 70.00    Types: Cigarettes    Quit date: 12/02/1984    Years since quitting: 38.3   Smokeless tobacco: Never  Vaping Use   Vaping Use: Never used  Substance Use Topics   Alcohol use: No    Comment: quit 85   Drug use: No     ROS:  Please see the history of present illness.     All other systems reviewed  and negative.    Physical Exam: Blood pressure (!) 96/55, pulse 61, height  (1.88 m), weight 257 lb 3.2 oz (116.7 kg), SpO2 98 %.       GEN:  obese , elderly man  in no acute distress HEENT: Normal NECK: No JVD; No carotid bruits LYMPHATICS: No lymphadenopathy CARDIAC: RRR   RESPIRATORY:  Clear to auscultation without rales, wheezing or rhonchi  ABDOMEN: Soft, non-tender, non-distended MUSCULOSKELETAL:  No edema; No deformity  SKIN: Warm and dry NEUROLOGIC:  Alert and oriented x 3    EKG:        ASSESSMENT AND PLAN:   Atrial Fib:  new diagnosis of Atrial fib CHADS2VASC is  52  ( age 74, cad, HTN) Remains in NSR,  cont eliquis .   Sleep study was negative for OSA      2.    CAD :      no angina    3.   Hypertension:    BP is well controlled.   4.  Hyperlipidemia: - last lipids in May, 2023 looked good.  Managed by Dr. Jacky Kindle .    Will see him in 1 year    Kristeen Miss, MD  03/26/2023 8:08 AM    [facility]  [facility] [address] [place],  [address] [place], [place]  [address] Pager [phone] Phone: [phone]; Fax: [phone]

## 2023-03-28 DIAGNOSIS — M109 Gout, unspecified: Secondary | ICD-10-CM | POA: Diagnosis not present

## 2023-03-28 DIAGNOSIS — M79675 Pain in left toe(s): Secondary | ICD-10-CM | POA: Diagnosis not present

## 2023-04-06 ENCOUNTER — Encounter: Payer: Self-pay | Admitting: Cardiovascular Disease

## 2023-04-06 DIAGNOSIS — R0602 Shortness of breath: Secondary | ICD-10-CM

## 2023-04-16 ENCOUNTER — Other Ambulatory Visit: Payer: Self-pay | Admitting: Cardiovascular Disease

## 2023-04-16 DIAGNOSIS — E785 Hyperlipidemia, unspecified: Secondary | ICD-10-CM | POA: Diagnosis not present

## 2023-04-16 DIAGNOSIS — E1169 Type 2 diabetes mellitus with other specified complication: Secondary | ICD-10-CM | POA: Diagnosis not present

## 2023-04-16 DIAGNOSIS — Z125 Encounter for screening for malignant neoplasm of prostate: Secondary | ICD-10-CM | POA: Diagnosis not present

## 2023-04-16 DIAGNOSIS — R7989 Other specified abnormal findings of blood chemistry: Secondary | ICD-10-CM | POA: Diagnosis not present

## 2023-04-16 DIAGNOSIS — R0609 Other forms of dyspnea: Secondary | ICD-10-CM

## 2023-04-16 DIAGNOSIS — N529 Male erectile dysfunction, unspecified: Secondary | ICD-10-CM | POA: Diagnosis not present

## 2023-04-16 DIAGNOSIS — I10 Essential (primary) hypertension: Secondary | ICD-10-CM | POA: Diagnosis not present

## 2023-04-16 DIAGNOSIS — K219 Gastro-esophageal reflux disease without esophagitis: Secondary | ICD-10-CM | POA: Diagnosis not present

## 2023-04-16 NOTE — Telephone Encounter (Signed)
Matthew Love, Matthew Ping, MD  Quintella Reichert (1:09 PM)   Lets get an echo and lexiscan myoview Thanks  PN    Order placed for ECHO & Lexiscan. Routing to MD to sign attestation. Pt made aware via MyChart.

## 2023-04-18 ENCOUNTER — Telehealth (HOSPITAL_COMMUNITY): Payer: Self-pay | Admitting: *Deleted

## 2023-04-18 NOTE — Telephone Encounter (Signed)
Left detailed instructions on vm for MPI study Scheduled on 04/21/23.

## 2023-04-21 ENCOUNTER — Ambulatory Visit (HOSPITAL_COMMUNITY): Payer: Medicare Other | Attending: Cardiology

## 2023-04-21 DIAGNOSIS — R0602 Shortness of breath: Secondary | ICD-10-CM | POA: Insufficient documentation

## 2023-04-21 LAB — MYOCARDIAL PERFUSION IMAGING
Estimated workload: 1
Exercise duration (min): 1 min
Exercise duration (sec): 0 s
LV dias vol: 64 mL (ref 62–150)
LV sys vol: 20 mL
MPHR: 148 {beats}/min
Nuc Stress EF: 69 %
Peak HR: 90 {beats}/min
Percent HR: 60 %
Rest HR: 58 {beats}/min
Rest Nuclear Isotope Dose: 10.9 mCi
SDS: 1
SRS: 1
SSS: 2
ST Depression (mm): 0 mm
Stress Nuclear Isotope Dose: 31.2 mCi
TID: 1.07

## 2023-04-21 MED ORDER — REGADENOSON 0.4 MG/5ML IV SOLN
0.4000 mg | Freq: Once | INTRAVENOUS | Status: AC
Start: 2023-04-21 — End: 2023-04-21
  Administered 2023-04-21: 0.4 mg via INTRAVENOUS

## 2023-04-21 MED ORDER — TECHNETIUM TC 99M TETROFOSMIN IV KIT
31.2000 | PACK | Freq: Once | INTRAVENOUS | Status: AC | PRN
  Administered 2023-04-21: 31.2 via INTRAVENOUS

## 2023-04-21 MED ORDER — TECHNETIUM TC 99M TETROFOSMIN IV KIT
10.9000 | PACK | Freq: Once | INTRAVENOUS | Status: AC | PRN
  Administered 2023-04-21: 10.9 via INTRAVENOUS

## 2023-04-30 DIAGNOSIS — E785 Hyperlipidemia, unspecified: Secondary | ICD-10-CM | POA: Diagnosis not present

## 2023-04-30 DIAGNOSIS — E1169 Type 2 diabetes mellitus with other specified complication: Secondary | ICD-10-CM | POA: Diagnosis not present

## 2023-04-30 DIAGNOSIS — I10 Essential (primary) hypertension: Secondary | ICD-10-CM | POA: Diagnosis not present

## 2023-04-30 DIAGNOSIS — R82998 Other abnormal findings in urine: Secondary | ICD-10-CM | POA: Diagnosis not present

## 2023-04-30 DIAGNOSIS — Z23 Encounter for immunization: Secondary | ICD-10-CM | POA: Diagnosis not present

## 2023-04-30 DIAGNOSIS — E669 Obesity, unspecified: Secondary | ICD-10-CM | POA: Diagnosis not present

## 2023-04-30 DIAGNOSIS — E119 Type 2 diabetes mellitus without complications: Secondary | ICD-10-CM | POA: Diagnosis not present

## 2023-04-30 DIAGNOSIS — Z0000 Encounter for general adult medical examination without abnormal findings: Secondary | ICD-10-CM | POA: Diagnosis not present

## 2023-05-15 ENCOUNTER — Ambulatory Visit (HOSPITAL_COMMUNITY): Payer: Medicare Other | Attending: Cardiology

## 2023-05-15 DIAGNOSIS — R0602 Shortness of breath: Secondary | ICD-10-CM | POA: Diagnosis not present

## 2023-05-15 LAB — ECHOCARDIOGRAM COMPLETE
Area-P 1/2: 2.71 cm2
S' Lateral: 2.9 cm

## 2023-05-15 MED ORDER — PERFLUTREN LIPID MICROSPHERE
1.0000 mL | INTRAVENOUS | Status: AC | PRN
Start: 2023-05-15 — End: 2023-05-15
  Administered 2023-05-15: 2 mL via INTRAVENOUS

## 2023-05-27 ENCOUNTER — Encounter: Payer: Self-pay | Admitting: Cardiovascular Disease

## 2023-05-28 DIAGNOSIS — M109 Gout, unspecified: Secondary | ICD-10-CM | POA: Diagnosis not present

## 2023-06-25 DIAGNOSIS — T148XXA Other injury of unspecified body region, initial encounter: Secondary | ICD-10-CM | POA: Diagnosis not present

## 2023-07-03 ENCOUNTER — Encounter: Payer: Self-pay | Admitting: Cardiovascular Disease

## 2023-07-03 MED ORDER — APIXABAN 5 MG PO TABS: 5.0000 mg | ORAL_TABLET | Freq: Two times a day (BID) | ORAL | Status: DC

## 2023-07-07 DIAGNOSIS — L02212 Cutaneous abscess of back [any part, except buttock]: Secondary | ICD-10-CM | POA: Diagnosis not present

## 2023-07-18 ENCOUNTER — Telehealth: Payer: Self-pay

## 2023-07-18 NOTE — Telephone Encounter (Signed)
Printed, signed, and faxed with confirmation received.

## 2023-07-18 NOTE — Telephone Encounter (Signed)
**Note De-Identified Dalynn Jhaveri Obfuscation** I completed the providers page of the pts BMSPA[employer] application for Eliquis assistance and have e-mailed the entire application along with documents to the nurse working with Dr Elease Hashimoto today so she can obtain his signature, date it, and to then fax all to [facility] at the fax number written on the cover letter included.

## 2023-07-18 NOTE — Telephone Encounter (Signed)
Patient dropped of assistance forms for Eloquis.  These documents are in your folder on the counter at the [employer].  Thank you!

## 2023-07-18 NOTE — Telephone Encounter (Signed)
Pt's patient assistance paperwork was scanned to [facility], [employer]. FYI

## 2023-07-20 ENCOUNTER — Other Ambulatory Visit: Payer: Self-pay | Admitting: Cardiology

## 2023-07-28 NOTE — Telephone Encounter (Signed)
**Note De-Identified Colette Dicamillo Obfuscation** Letter received from [facility] [employer] [facility] fax stating that they have denied the pt Eliquis assistance because: The patient's household income is greater than BMSPAFs current criteria. Documentation of 3% out of pocket RX expenses, based on the household adjusted gross income, not met. [id number]  The letter states that they have notified the pt of this denial as well.

## 2023-07-30 ENCOUNTER — Encounter: Payer: Self-pay | Admitting: Cardiovascular Disease

## 2023-07-30 DIAGNOSIS — I480 Paroxysmal atrial fibrillation: Secondary | ICD-10-CM

## 2023-07-31 MED ORDER — RIVAROXABAN 20 MG PO TABS
20.0000 mg | ORAL_TABLET | Freq: Every day | ORAL | 5 refills | Status: DC
Start: 2023-07-31 — End: 2023-08-05

## 2023-07-31 NOTE — Addendum Note (Signed)
Addended by: Cheree Ditto on: 07/31/2023 10:15 AM   Modules accepted: Orders

## 2023-08-05 ENCOUNTER — Other Ambulatory Visit: Payer: Self-pay

## 2023-08-05 DIAGNOSIS — I480 Paroxysmal atrial fibrillation: Secondary | ICD-10-CM

## 2023-08-05 MED ORDER — RIVAROXABAN 20 MG PO TABS
20.0000 mg | ORAL_TABLET | Freq: Every day | ORAL | 5 refills | Status: DC
Start: 2023-08-05 — End: 2023-12-18

## 2023-08-17 ENCOUNTER — Other Ambulatory Visit: Payer: Self-pay | Admitting: Cardiovascular Disease

## 2023-09-14 ENCOUNTER — Encounter: Payer: Self-pay | Admitting: Cardiovascular Disease

## 2023-10-14 IMAGING — DX DG CHEST 2V
2 series · 2 of 2 positions shown · non-contrast
Comparison: 09/06/2014

CLINICAL DATA: Chest pain

EXAM:
CHEST - 2 VIEW

[w chest pa]
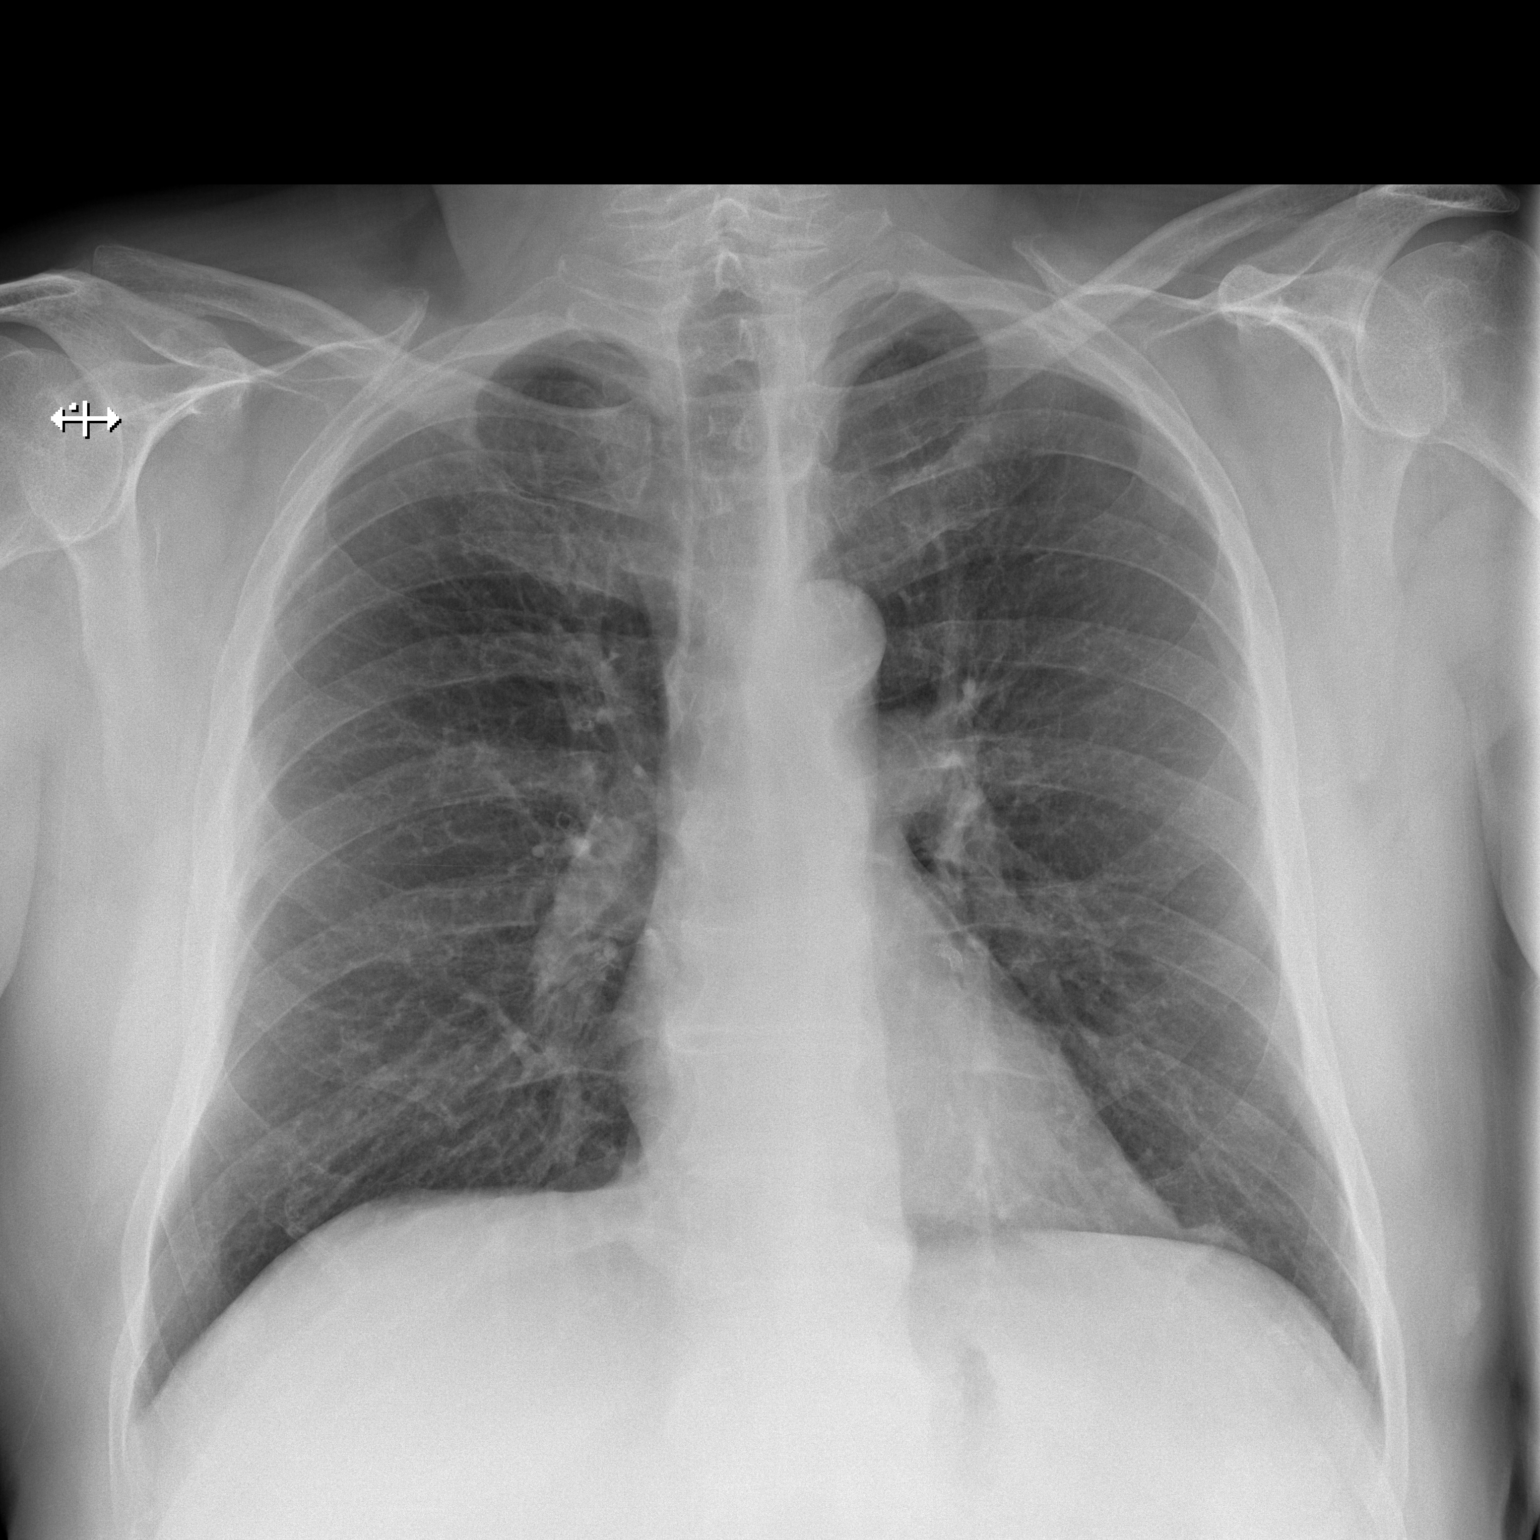

[w chest lat]
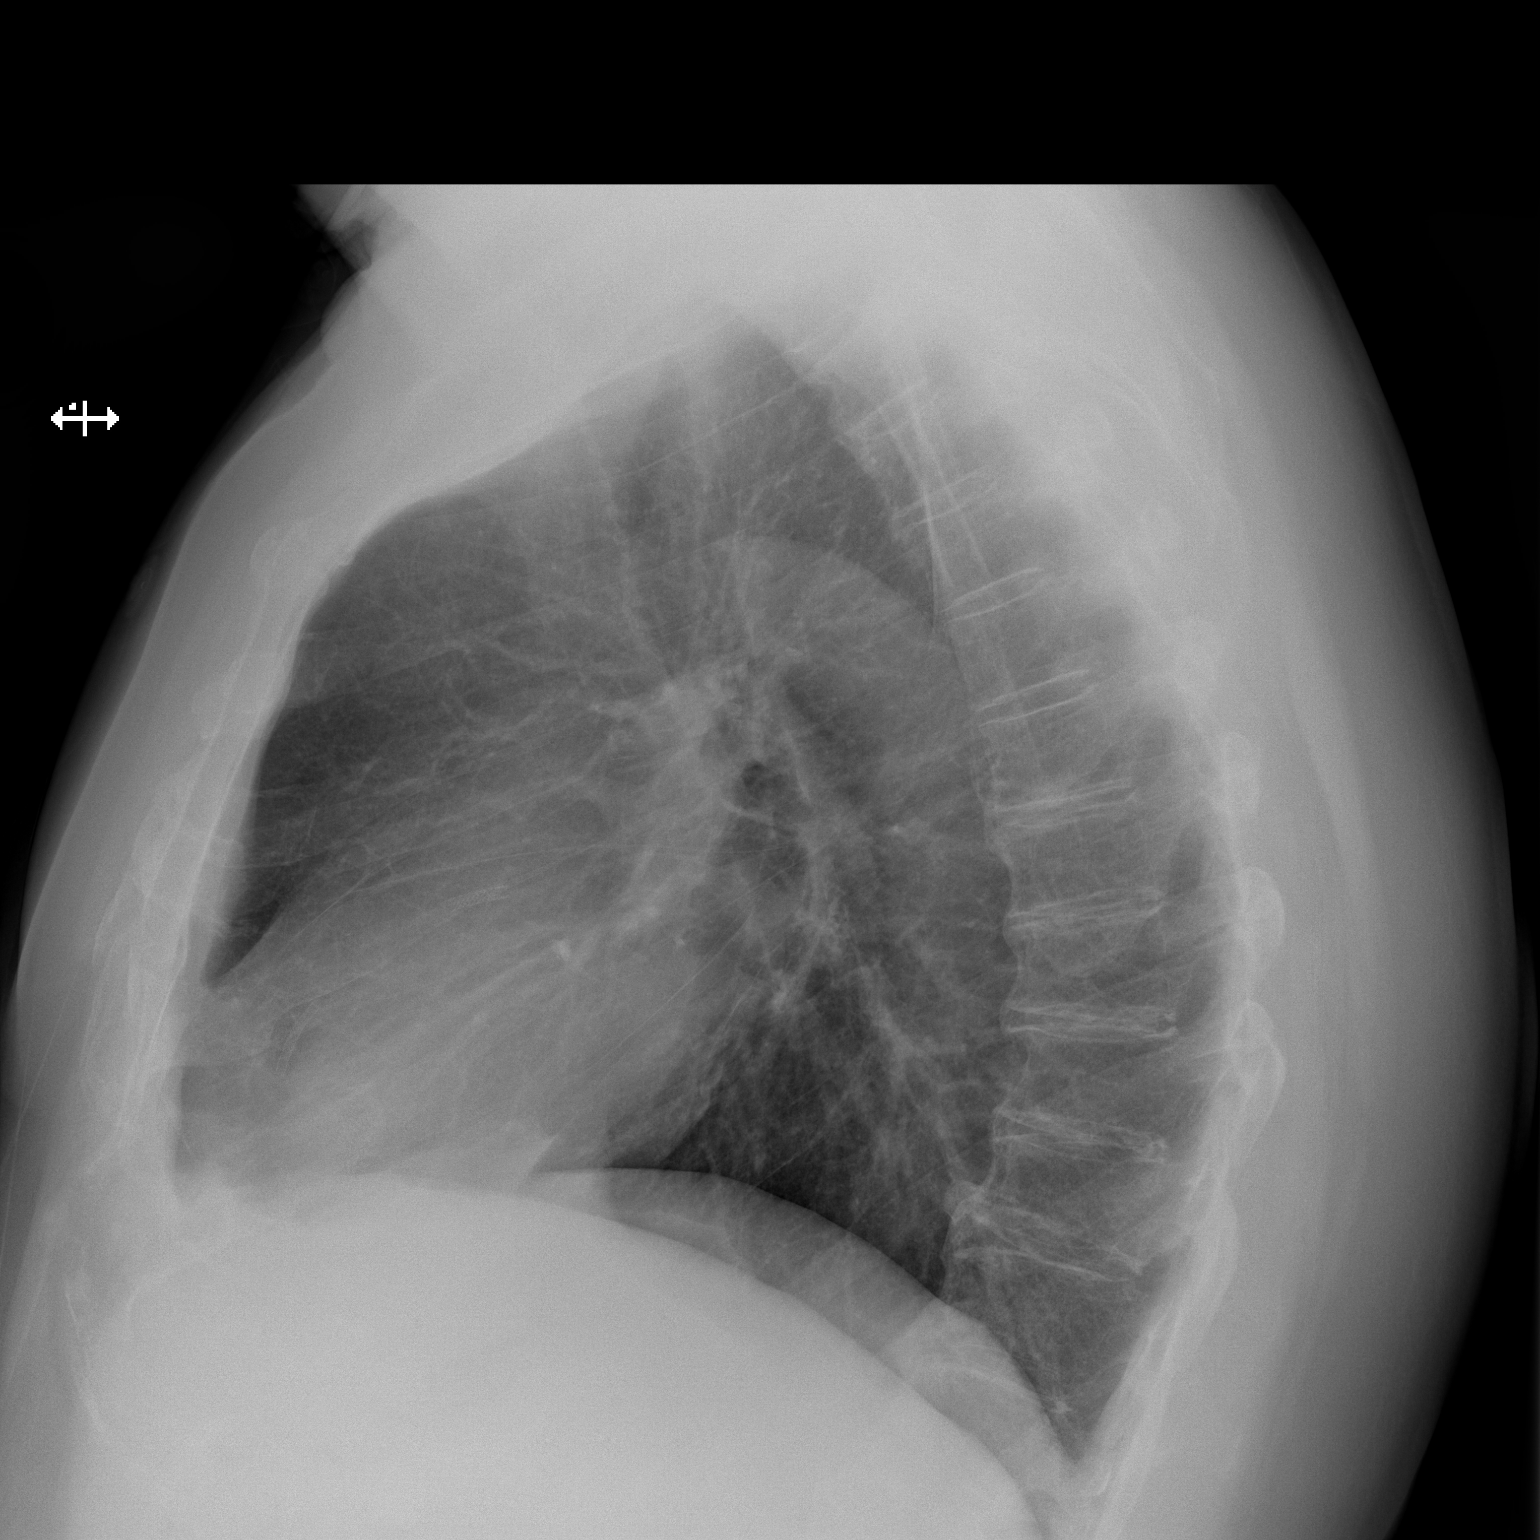

[2 of 2 positions shown; findings below may reference images not displayed]

FINDINGS: The heart size and mediastinal contours are within normal limits.
Atherosclerotic calcification of the aortic knob. No focal airspace
consolidation, pleural effusion, or pneumothorax. The visualized
skeletal structures are unremarkable.
IMPRESSION: No active cardiopulmonary disease.

## 2023-10-27 ENCOUNTER — Encounter: Payer: Self-pay | Admitting: Cardiovascular Disease

## 2023-10-28 ENCOUNTER — Telehealth: Payer: Self-pay

## 2023-10-28 DIAGNOSIS — M7989 Other specified soft tissue disorders: Secondary | ICD-10-CM

## 2023-10-28 DIAGNOSIS — Z79899 Other long term (current) drug therapy: Secondary | ICD-10-CM

## 2023-10-28 NOTE — Telephone Encounter (Signed)
Call to patient as he is reporting on Mychart that his legs are swollen.  Patient states he has had leg swelling for 2-3 weeks but denies SOB. He states he has been elevating his legs in his recliner with no improvement and denies any increase in his salt intake. Patient states his weight was 222 lbs 1 month ago after he accomplished a significant weight loss, but today it is 237 lbs.   I verified with patient that he was taken off his irbesartan and dyazide last month due to dizziness and low BP, but he is still taking 12.5 mg lopressor BID and 40 mg lasix daily. He states he took both his morning dose of  lopressor and his daily lasix at 4 AM today. At the time of my call he gives his BP as 137/79 and HR as 59. Last BMET on file was drawn 08/12/22 and was 1.07. EF was 60-65% on Echo done 6/31/24.  Discussed with Dr. Elease Hashimoto, who requests BMET today which patient agrees to. Also advised patient per Dr. Elease Hashimoto to increase lasix to 40 mg BID for 2-3 days. DOD appt made for 11/06/23 at 4:30 pm. Asked patient to track weight/BP daily, decrease salt intake and continue to elevate legs. Patient verbalizes understanding.

## 2023-10-29 LAB — BASIC METABOLIC PANEL
BUN/Creatinine Ratio: 12 (ref 10–24)
BUN: 13 mg/dL (ref 8–27)
CO2: 27 mmol/L (ref 20–29)
Calcium: 9.3 mg/dL (ref 8.6–10.2)
Chloride: 104 mmol/L (ref 96–106)
Creatinine, Ser: 1.13 mg/dL (ref 0.76–1.27)
Glucose: 127 mg/dL — ABNORMAL HIGH (ref 70–99)
Potassium: 3.8 mmol/L (ref 3.5–5.2)
Sodium: 143 mmol/L (ref 134–144)
eGFR: 69 mL/min/{1.73_m2} (ref >59)

## 2023-11-06 ENCOUNTER — Ambulatory Visit: Payer: Medicare Other | Admitting: Cardiovascular Disease

## 2023-11-29 ENCOUNTER — Encounter: Payer: Self-pay | Admitting: Cardiovascular Disease

## 2023-12-04 ENCOUNTER — Emergency Department (HOSPITAL_COMMUNITY): Payer: Medicare Other

## 2023-12-04 ENCOUNTER — Inpatient Hospital Stay (HOSPITAL_COMMUNITY)
Admission: EM | Admit: 2023-12-04 | Discharge: 2023-12-06 | DRG: 641 | Disposition: A | Discharge status: Home / Self Care | Payer: Medicare Other | Attending: Internal Medicine | Admitting: Internal Medicine

## 2023-12-04 ENCOUNTER — Encounter: Payer: Self-pay | Admitting: Cardiovascular Disease

## 2023-12-04 ENCOUNTER — Other Ambulatory Visit: Payer: Self-pay

## 2023-12-04 DIAGNOSIS — R9431 Abnormal electrocardiogram [ECG] [EKG]: Secondary | ICD-10-CM

## 2023-12-04 DIAGNOSIS — Z8673 Personal history of transient ischemic attack (TIA), and cerebral infarction without residual deficits: Secondary | ICD-10-CM

## 2023-12-04 DIAGNOSIS — F419 Anxiety disorder, unspecified: Secondary | ICD-10-CM | POA: Diagnosis present

## 2023-12-04 DIAGNOSIS — Z7902 Long term (current) use of antithrombotics/antiplatelets: Secondary | ICD-10-CM

## 2023-12-04 DIAGNOSIS — E785 Hyperlipidemia, unspecified: Secondary | ICD-10-CM | POA: Diagnosis present

## 2023-12-04 DIAGNOSIS — G4730 Sleep apnea, unspecified: Secondary | ICD-10-CM | POA: Diagnosis present

## 2023-12-04 DIAGNOSIS — Z885 Allergy status to narcotic agent status: Secondary | ICD-10-CM

## 2023-12-04 DIAGNOSIS — Z87891 Personal history of nicotine dependence: Secondary | ICD-10-CM

## 2023-12-04 DIAGNOSIS — Z820 Family history of epilepsy and other diseases of the nervous system: Secondary | ICD-10-CM

## 2023-12-04 DIAGNOSIS — K219 Gastro-esophageal reflux disease without esophagitis: Secondary | ICD-10-CM | POA: Diagnosis present

## 2023-12-04 DIAGNOSIS — N179 Acute kidney failure, unspecified: Secondary | ICD-10-CM | POA: Diagnosis not present

## 2023-12-04 DIAGNOSIS — Z7901 Long term (current) use of anticoagulants: Secondary | ICD-10-CM

## 2023-12-04 DIAGNOSIS — Z8249 Family history of ischemic heart disease and other diseases of the circulatory system: Secondary | ICD-10-CM

## 2023-12-04 DIAGNOSIS — I2510 Atherosclerotic heart disease of native coronary artery without angina pectoris: Secondary | ICD-10-CM | POA: Diagnosis present

## 2023-12-04 DIAGNOSIS — I110 Hypertensive heart disease with heart failure: Secondary | ICD-10-CM | POA: Diagnosis present

## 2023-12-04 DIAGNOSIS — E876 Hypokalemia: Secondary | ICD-10-CM | POA: Diagnosis not present

## 2023-12-04 DIAGNOSIS — I480 Paroxysmal atrial fibrillation: Secondary | ICD-10-CM | POA: Diagnosis present

## 2023-12-04 DIAGNOSIS — Z955 Presence of coronary angioplasty implant and graft: Secondary | ICD-10-CM

## 2023-12-04 DIAGNOSIS — Z79899 Other long term (current) drug therapy: Secondary | ICD-10-CM

## 2023-12-04 DIAGNOSIS — I5032 Chronic diastolic (congestive) heart failure: Secondary | ICD-10-CM | POA: Diagnosis present

## 2023-12-04 DIAGNOSIS — Z91030 Bee allergy status: Secondary | ICD-10-CM

## 2023-12-04 DIAGNOSIS — E119 Type 2 diabetes mellitus without complications: Secondary | ICD-10-CM | POA: Diagnosis present

## 2023-12-04 LAB — URINALYSIS, ROUTINE W REFLEX MICROSCOPIC
Bilirubin Urine: NEGATIVE
Glucose, UA: NEGATIVE mg/dL
Hgb urine dipstick: NEGATIVE
Ketones, ur: NEGATIVE mg/dL
Leukocytes,Ua: NEGATIVE
Nitrite: NEGATIVE
Protein, ur: NEGATIVE mg/dL
Specific Gravity, Urine: 1.012 (ref 1.005–1.030)
pH: 6 (ref 5.0–8.0)

## 2023-12-04 LAB — CBC
HCT: 37.9 % — ABNORMAL LOW (ref 39.0–52.0)
Hemoglobin: 12.9 g/dL — ABNORMAL LOW (ref 13.0–17.0)
MCH: 28.2 pg (ref 26.0–34.0)
MCHC: 34 g/dL (ref 30.0–36.0)
MCV: 82.8 fL (ref 80.0–100.0)
Platelets: 414 10*3/uL — ABNORMAL HIGH (ref 150–400)
RBC: 4.58 MIL/uL (ref 4.22–5.81)
RDW: 13.3 % (ref 11.5–15.5)
WBC: 9 10*3/uL (ref 4.0–10.5)
nRBC: 0 % (ref 0.0–0.2)

## 2023-12-04 LAB — BASIC METABOLIC PANEL
Anion gap: 18 — ABNORMAL HIGH (ref 5–15)
BUN: 20 mg/dL (ref 8–23)
CO2: 28 mmol/L (ref 22–32)
Calcium: 9.7 mg/dL (ref 8.9–10.3)
Chloride: 86 mmol/L — ABNORMAL LOW (ref 98–111)
Creatinine, Ser: 1.5 mg/dL — ABNORMAL HIGH (ref 0.61–1.24)
GFR, Estimated: 49 mL/min — ABNORMAL LOW (ref >60)
Glucose, Bld: 121 mg/dL — ABNORMAL HIGH (ref 70–99)
Potassium: 2.4 mmol/L — CL (ref 3.5–5.1)
Sodium: 132 mmol/L — ABNORMAL LOW (ref 135–145)

## 2023-12-04 LAB — MAGNESIUM: Magnesium: 2.1 mg/dL (ref 1.7–2.4)

## 2023-12-04 LAB — TROPONIN I (HIGH SENSITIVITY)
Troponin I (High Sensitivity): 16 ng/L (ref <18)
Troponin I (High Sensitivity): 15 ng/L (ref <18)

## 2023-12-04 MED ORDER — ACETAMINOPHEN 650 MG RE SUPP: 650.0000 mg | Freq: Four times a day (QID) | RECTAL | Status: DC | PRN

## 2023-12-04 MED ORDER — ACETAMINOPHEN 325 MG PO TABS: 650.0000 mg | ORAL_TABLET | Freq: Four times a day (QID) | ORAL | Status: DC | PRN

## 2023-12-04 MED ORDER — POTASSIUM CHLORIDE 10 MEQ/100ML IV SOLN
10.0000 meq | INTRAVENOUS | Status: AC
Start: 2023-12-04 — End: 2023-12-05
  Administered 2023-12-04 (×4): 10 meq via INTRAVENOUS
  Filled 2023-12-04 (×4): qty 100

## 2023-12-04 MED ORDER — POTASSIUM CHLORIDE CRYS ER 20 MEQ PO TBCR
40.0000 meq | EXTENDED_RELEASE_TABLET | Freq: Once | ORAL | Status: AC
  Administered 2023-12-04: 40 meq via ORAL
  Filled 2023-12-04: qty 2

## 2023-12-04 NOTE — Telephone Encounter (Signed)
 Called pt spouse as requested.  Reports pt has c/o AF getting worse along with difficulty breathing.  Pt was in the kitchen picking up waters and spouse reports he passed out about 10 min ago. Pt reports it felt like an out of body experience.   Pt denies straining while picking up water.  BP 170/67-67.  Is currently up and walking around. Spouse wants to know if she should bring pt to our office or go to the ED.  Advised ED visit spouse expresses understanding no further questions at this time.

## 2023-12-04 NOTE — ED Provider Notes (Signed)
 [facility] Provider Note   CSN: [id number] Arrival date & time: 12/04/23  1323     History  Chief Complaint  Patient presents with   Near Syncope    Matthew Love is a 73 y.o. male history of stroke, diabetes, GERD, CHF with last echo 05/15/2023 with LVEF of 60 to 65% presented with syncope and weakness for the past week.  Patient denies any medication changes but states that today when he went to go lift up a package of water he passed out but denies hitting his head or neck area and denies any pain or vision changes.  Patient denies chest pain or shortness of breath or abdominal pain or nausea/vomiting or seizure-like activity.  Patient denies sick contacts or fevers.  Patient states he has been eating and drinking okay.  Patient is on Xarelto  for A-fib.   Home Medications Prior to Admission medications   Medication Sig Start Date End Date Taking? Authorizing Provider  atorvastatin  (LIPITOR) 40 MG tablet TAKE ONE TABLET BY MOUTH ONCE DAILY AT  6  PM 03/18/17   Verlin Lonni JONETTA, MD  clopidogrel  (PLAVIX ) 75 MG tablet Take 1 tablet by mouth once daily 01/22/21   Nahser, Aleene PARAS, MD  EPINEPHrine  (EPIPEN ) 0.3 mg/0.3 mL SOAJ injection Inject 0.3 mLs (0.3 mg total) into the muscle once. For signs or symptoms of anaphylaxis. 02/20/14   Horton, Charmaine FALCON, MD  escitalopram  (LEXAPRO ) 10 MG tablet Take 10 mg by mouth daily.  02/11/13   [provider]  furosemide  (LASIX ) 40 MG tablet Take 1 tablet (40 mg total) by mouth daily. 07/22/23   Nahser, Aleene PARAS, MD  irbesartan  (AVAPRO ) 75 MG tablet Take 1 tablet (75 mg total) by mouth daily. 04/10/21   Nahser, Aleene PARAS, MD  metoprolol  tartrate (LOPRESSOR ) 25 MG tablet Take 1/2 (one-half) tablet by mouth twice daily 08/18/23   Nahser, Aleene PARAS, MD  Multiple Vitamin (MULTIVITAMIN) tablet Take 1 tablet by mouth daily.    [provider]  nitroGLYCERIN  (NITROSTAT ) 0.4 MG SL tablet Place 1  tablet (0.4 mg total) under the tongue every 5 (five) minutes as needed for chest pain. 01/14/20   Nahser, Aleene PARAS, MD  OMEGA 3 1200 MG CAPS Take 1,200 mg by mouth daily.    [provider]  pantoprazole  (PROTONIX ) 40 MG tablet Take 40 mg by mouth 2 (two) times daily. 07/17/17   [provider]  rivaroxaban  (XARELTO ) 20 MG TABS tablet Take 1 tablet (20 mg total) by mouth daily with supper. 08/05/23   Nahser, Aleene PARAS, MD  triamterene -hydrochlorothiazide  (DYAZIDE ) 37.5-25 MG capsule Take 1 capsule by mouth daily. 08/08/22   [provider]      Allergies    Bee venom, Bee pollen, Oxycodone -acetaminophen , and Hydrocodone-acetaminophen     Review of Systems   Review of Systems  Physical Exam Updated Vital Signs BP (!) 129/118   Pulse 60   Temp 98.5 F (36.9 C) (Oral)   Resp 20   Wt 116 kg   SpO2 100%   BMI 32.83 kg/m  Physical Exam Vitals reviewed.  Constitutional:      General: He is not in acute distress. HENT:     Head: Normocephalic and atraumatic.  Eyes:     Extraocular Movements: Extraocular movements intact.     Conjunctiva/sclera: Conjunctivae normal.     Pupils: Pupils are equal, round, and reactive to light.  Cardiovascular:     Rate and Rhythm: Normal  rate and regular rhythm.     Pulses: Normal pulses.     Heart sounds: Normal heart sounds.     Comments: 2+ bilateral radial/dorsalis pedis pulses with regular rate Pulmonary:     Effort: Pulmonary effort is normal. No respiratory distress.     Breath sounds: Normal breath sounds.  Abdominal:     Palpations: Abdomen is soft.     Tenderness: There is no abdominal tenderness. There is no guarding or rebound.  Musculoskeletal:        General: Normal range of motion.     Cervical back: Normal range of motion and neck supple.     Comments: 5 out of 5 bilateral grip/leg extension strength  Skin:    General: Skin is warm and dry.     Capillary Refill: Capillary refill takes less than 2 seconds.   Neurological:     General: No focal deficit present.     Mental Status: He is alert and oriented to person, place, and time.     Sensory: Sensation is intact.     Motor: Motor function is intact.     Coordination: Coordination is intact.     Gait: Gait is intact.     Comments: Sensation intact in all 4 limbs Cranial nerves III through XII intact Vision grossly intact  Psychiatric:        Mood and Affect: Mood normal.     ED Results / Procedures / Treatments   Labs (all labs ordered are listed, but only abnormal results are displayed) Labs Reviewed  BASIC METABOLIC PANEL - Abnormal; Notable for the following components:      Result Value   Sodium 132 (*)    Potassium 2.4 (*)    Chloride 86 (*)    Glucose, Bld 121 (*)    Creatinine, Ser 1.50 (*)    GFR, Estimated 49 (*)    Anion gap 18 (*)    All other components within normal limits  CBC - Abnormal; Notable for the following components:   Hemoglobin 12.9 (*)    HCT 37.9 (*)    Platelets 414 (*)    All other components within normal limits  URINALYSIS, ROUTINE W REFLEX MICROSCOPIC  MAGNESIUM  CBG MONITORING, ED  TROPONIN I (HIGH SENSITIVITY)  TROPONIN I (HIGH SENSITIVITY)    EKG EKG Interpretation Date/Time:  Thursday December 04 2023 13:41:29 EST Ventricular Rate:  72 PR Interval:  168 QRS Duration:  104 QT Interval:  512 QTC Calculation: 560 R Axis:   -65  Text Interpretation: Sinus rhythm with Premature atrial complexes Left axis deviation Nonspecific ST abnormality Prolonged QT Abnormal ECG When compared with ECG of 26-Sep-2021 08:13, PREVIOUS ECG IS PRESENT Confirmed by Matthew Cough [phone]) on 12/04/2023 7:02:00 PM  Radiology CT Head Wo Contrast Result Date: 12/04/2023 CLINICAL DATA:  Recent syncopal episode EXAM: CT HEAD WITHOUT CONTRAST TECHNIQUE: Contiguous axial images were obtained from the base of the skull through the vertex without intravenous contrast. RADIATION DOSE REDUCTION: This exam was  performed according to the departmental dose-optimization program which includes automated exposure control, adjustment of the mA and/or kV according to patient size and/or use of iterative reconstruction technique. COMPARISON:  06/07/2022 FINDINGS: Brain: No evidence of acute infarction, hemorrhage, hydrocephalus, extra-axial collection or mass lesion/mass effect. Vascular: No hyperdense vessel or unexpected calcification. Skull: Normal. Negative for fracture or focal lesion. Sinuses/Orbits: No acute finding. Other: None. IMPRESSION: No acute intracranial abnormality noted. Electronically Signed   By: Matthew Love.D.  On: 12/04/2023 22:37   DG Chest 2 View Result Date: 12/04/2023 CLINICAL DATA:  Short of breath, lifting injury, fell EXAM: CHEST - 2 VIEW COMPARISON:  07/30/2022 FINDINGS: The heart size and mediastinal contours are within normal limits. Both lungs are clear. The visualized skeletal structures are unremarkable. IMPRESSION: No active cardiopulmonary disease. Electronically Signed   By: Matthew Love M.D.   On: 12/04/2023 16:32    Procedures .Critical Care  Performed by: Matthew Lynwood DASEN, PA-C Authorized by: Matthew Lynwood DASEN, PA-C   Critical care provider statement:    Critical care time (minutes):  30   Critical care time was exclusive of:  Separately billable procedures and treating other patients   Critical care was necessary to treat or prevent imminent or life-threatening deterioration of the following conditions: Symptomatic hypokalemia.   Critical care was time spent personally by me on the following activities:  Blood draw for specimens, development of treatment plan with patient or surrogate, discussions with consultants, evaluation of patient's response to treatment, examination of patient, obtaining history from patient or surrogate, review of old charts, re-evaluation of patient's condition, pulse oximetry, ordering and review of radiographic studies, ordering and review of  laboratory studies and ordering and performing treatments and interventions   I assumed direction of critical care for this patient from another provider in my specialty: no     Care discussed with: admitting provider       Medications Ordered in ED Medications  potassium chloride  10 mEq in 100 mL IVPB (10 mEq Intravenous New Bag/Given 12/04/23 2237)  potassium chloride  SA (KLOR-CON  M) CR tablet 40 mEq (40 mEq Oral Given 12/04/23 2011)    ED Course/ Medical Decision Making/ A&P                                 Medical Decision Making Amount and/or Complexity of Data Reviewed Labs: ordered. Radiology: ordered.  Risk Prescription drug management. Decision regarding hospitalization.   Zaydyn D Doss 73 y.o. presented today for syncope. Working DDx that I considered at this time includes, but not limited to, orthostatic hypotension vs vasovagal episode, arrhythmia, AS, ACS, PE, Pleural Effusion, Aortic Dissection, SAH, CVA/TIA, Vertebrobasilar Insufficiency, GIB, Trauma, OD, Sepsis, UTI, electrolyte abnormalities, anemia.  R/o DDx: orthostatic hypotension vs vasovagal episode, arrhythmia, AS, ACS, PE, Pleural Effusion, Aortic Dissection, SAH, CVA/TIA, Vertebrobasilar Insufficiency, GIB, Trauma, OD, Sepsis, UTI, anemia: These are considered less likely due to history of present illness, physical exam, labs/imaging findings  Review of prior external notes: 07/07/2023 office visit  Unique Tests and My Interpretation:  CBC: Unremarkable BMP: Hypokalemia 2.4, mild increase in creatinine 1.5, anion gap 18 EKG: Sinus 72 bpm, prolonged QT, no ST elevations or depressions Troponin: 16, 15 CXR: Unremarkable CT Head w/o Contrast: Unremarkable UA: Unremarkable  Social Determinants of Health: none  Discussion with Independent Historian:  Wife  Discussion of Management of Tests:  Gardner, DO Hospitalist  Risk: High: hospitalization or escalation of hospital-level care  Risk  Stratification Score: none  Staffed with Trifan, MD  Plan: On exam patient was in no acute distress stable vitals.  Patient's physical exam including neurologic exam was unremarkable.  Labs and imaging from triage do show potassium of 2.4 that is most likely contributing to a prolonged QT.  Patient is on Lasix  which would explain the possible decrease in potassium along with being mildly dry on his labs.  After discussion with the patient we  agreed that patient will need a few rounds of IV potassium along with oral potassium and will need to be admitted to telemetry for this to be monitored.  Once we have more labs and imaging back we will call hospitalist for admission.   I spoke to the hospitalist and he will accept the patient for admission and will send patient to observation.  Patient stable for admission at this time.  This chart was dictated using voice recognition software.  Despite best efforts to proofread,  errors can occur which can change the documentation meaning.         Final Clinical Impression(s) / ED Diagnoses Final diagnoses:  Hypokalemia  Prolonged Q-T interval on ECG    Rx / DC Orders ED Discharge Orders     None         Matthew Lynwood ONEIDA DEVONNA 12/04/23 2307    Matthew Donnice PARAS, MD 12/04/23 2324

## 2023-12-04 NOTE — H&P (Signed)
 History and Physical    Patient: Matthew Love FMW:[id number] DOB: 15-Jun-1951 DOA: 12/04/2023 DOS: the patient was seen and examined on 12/04/2023 PCP: Shepard Ade, MD  Patient coming from: Home  Chief Complaint:  Chief Complaint  Patient presents with   Near Syncope   HPI: Matthew Love is a 73 y.o. male with medical history significant of CAD, DM2, HTN, HLD, PAF on Xarelto .  Pt with nl EF and grade 1 DD as of June echo.  Pt with increased peripheral edema last fall.  So pt started taking increased dose of Lasix  around that time.  This seemed to work well for his BLE edema.  It seems he has continued to take Lasix  40mg  PO daily for the past month in addition to diazide.  Today pt in to ED with c/o generalized weakness for the past week culminating in a near syncope episode today that occurred when he went to go lift up a package of water.  Patient denies chest pain or shortness of breath or abdominal pain or nausea/vomiting or seizure-like activity. Patient denies sick contacts or fevers. Patient states he has been eating and drinking okay.   Review of Systems: As mentioned in the history of present illness. All other systems reviewed and are negative. Past Medical History:  Diagnosis Date   Anxiety    Arthritis    Coronary artery disease    a. NST 08/2016: high-risk w/ inferior and lateral wall ischemia  b. cath 09/2016: 80% mid-LAD and 90% distal LAD stenosis. Placement of 2 DES. Mild nonobstructive dz along RCA and LCx.   Diabetes mellitus    A1C 6 weeks ago as of 09/08/14 was 6.2. Not on oral meds.   GERD (gastroesophageal reflux disease)    H/O cardiovascular stress test    ETT-Myoview  11/11: Inferior fixed defect likely due to diaphragmatic attenuation with normal wall motion, EF 69%   H/O echocardiogram    Echo 11/11: EF 60-65%, grade 1 diastolic dysfunction, mild LAE   H/O hiatal hernia    Hyperlipidemia    Hypertension    Mini stroke    Obesity     Pneumonia    hx   Sleep apnea    cpap 1 yr   Stroke [facility]) 10   Past Surgical History:  Procedure Laterality Date   BUNIONECTOMY     CARDIAC CATHETERIZATION N/A 09/06/2016   Procedure: Left Heart Cath and Coronary Angiography;  Surgeon: Lonni JONETTA Cash, MD;  Location: [facility] INVASIVE CV LAB;  Service: Cardiovascular;  Laterality: N/A;   CARDIAC CATHETERIZATION N/A 09/06/2016   Procedure: Coronary Stent Intervention;  Surgeon: Lonni JONETTA Cash, MD;  Location: [facility] INVASIVE CV LAB;  Service: Cardiovascular;  Laterality: N/A;   COLONOSCOPY  2011   CORONARY STENT PLACEMENT  09/06/2016   Severe stenosis mid LAD, s/p successful PTCA/DES x 1   FOOT SURGERY Left    bunion   Hydrocell Removal     KNEE SURGERY Left    arthroscopy   NASAL SEPTOPLASTY W/ TURBINOPLASTY N/A 09/08/2014   Procedure: NASAL SEPTOPLASTY WITH TURBINATE REDUCTION;  Surgeon: Marlyce Finer, MD;  Location: [facility] OR;  Service: ENT;  Laterality: N/A;   TONSILLECTOMY     Social History:  reports that he quit smoking about 39 years ago. His smoking use included cigarettes. He started smoking about 59 years ago. He has a 70 pack-year smoking history. He has never used smokeless tobacco. He reports that he does not drink alcohol  and does not use drugs.  Allergies  Allergen Reactions   Bee Venom Anaphylaxis, Itching and Swelling   Bee Pollen    Oxycodone -Acetaminophen      Other reaction(s): Unknown   Hydrocodone-Acetaminophen  Rash    Family History  Problem Relation Age of Onset   Prostate cancer Father    Colon cancer Father    Heart attack Mother    Heart disease Other        paternal grandparents   Alzheimer's disease Maternal Grandmother    Stomach cancer Neg Hx     Prior to Admission medications   Medication Sig Start Date End Date Taking? Authorizing Provider  atorvastatin  (LIPITOR) 40 MG tablet TAKE ONE TABLET BY MOUTH ONCE DAILY AT  6  PM 03/18/17   Verlin Lonni BIRCH, MD  clopidogrel  (PLAVIX ) 75 MG  tablet Take 1 tablet by mouth once daily 01/22/21   Nahser, Aleene PARAS, MD  EPINEPHrine  (EPIPEN ) 0.3 mg/0.3 mL SOAJ injection Inject 0.3 mLs (0.3 mg total) into the muscle once. For signs or symptoms of anaphylaxis. 02/20/14   Horton, Charmaine FALCON, MD  escitalopram  (LEXAPRO ) 10 MG tablet Take 10 mg by mouth daily.  02/11/13   [provider]  furosemide  (LASIX ) 40 MG tablet Take 1 tablet (40 mg total) by mouth daily. 07/22/23   Nahser, Aleene PARAS, MD  irbesartan  (AVAPRO ) 75 MG tablet Take 1 tablet (75 mg total) by mouth daily. 04/10/21   Nahser, Aleene PARAS, MD  metoprolol  tartrate (LOPRESSOR ) 25 MG tablet Take 1/2 (one-half) tablet by mouth twice daily 08/18/23   Nahser, Aleene PARAS, MD  Multiple Vitamin (MULTIVITAMIN) tablet Take 1 tablet by mouth daily.    [provider]  nitroGLYCERIN  (NITROSTAT ) 0.4 MG SL tablet Place 1 tablet (0.4 mg total) under the tongue every 5 (five) minutes as needed for chest pain. 01/14/20   Nahser, Aleene PARAS, MD  OMEGA 3 1200 MG CAPS Take 1,200 mg by mouth daily.    [provider]  pantoprazole  (PROTONIX ) 40 MG tablet Take 40 mg by mouth 2 (two) times daily. 07/17/17   [provider]  rivaroxaban  (XARELTO ) 20 MG TABS tablet Take 1 tablet (20 mg total) by mouth daily with supper. 08/05/23   Nahser, Aleene PARAS, MD  triamterene -hydrochlorothiazide  (DYAZIDE ) 37.5-25 MG capsule Take 1 capsule by mouth daily. 08/08/22   [provider]    Physical Exam: Vitals:   12/04/23 1346 12/04/23 1813 12/04/23 2030 12/04/23 2135  BP:  136/76 (!) 141/67 (!) 129/118  Pulse:  71 (!) 59 60  Resp:  18 12 20   Temp:  98.5 F (36.9 C)    TempSrc:  Oral    SpO2:  100% 100% 100%  Weight: 116 kg      Constitutional: NAD, calm, comfortable Respiratory: clear to auscultation bilaterally, no wheezing, no crackles. Normal respiratory effort. No accessory muscle use.  Cardiovascular: Regular rate and rhythm, no murmurs / rubs / gallops. No extremity edema. 2+ pedal  pulses. No carotid bruits.  Abdomen: no tenderness, no masses palpated. No hepatosplenomegaly. Bowel sounds positive.  Neurologic: CN 2-12 grossly intact. Sensation intact, DTR normal. Strength 5/5 in all 4.  Psychiatric: Normal judgment and insight. Alert and oriented x 3. Normal mood.   Data Reviewed:    Labs on Admission: I have personally reviewed following labs and imaging studies  CBC: Recent Labs  Lab 12/04/23 1348  WBC 9.0  HGB 12.9*  HCT 37.9*  MCV 82.8  PLT 414*   Basic Metabolic Panel: Recent Labs  Lab 12/04/23  1348 12/04/23 1650  NA 132*  --   K 2.4*  --   CL 86*  --   CO2 28  --   GLUCOSE 121*  --   BUN 20  --   CREATININE 1.50*  --   CALCIUM  9.7  --   MG  --  2.1   GFR: CrCl cannot be calculated (Unknown ideal weight.). Liver Function Tests: No results for input(s): AST, ALT, ALKPHOS, BILITOT, PROT, ALBUMIN in the last 168 hours. No results for input(s): LIPASE, AMYLASE in the last 168 hours. No results for input(s): AMMONIA in the last 168 hours. Coagulation Profile: No results for input(s): INR, PROTIME in the last 168 hours. Cardiac Enzymes: No results for input(s): CKTOTAL, CKMB, CKMBINDEX, TROPONINI in the last 168 hours. BNP (last 3 results) No results for input(s): PROBNP in the last 8760 hours. HbA1C: No results for input(s): HGBA1C in the last 72 hours. CBG: No results for input(s): GLUCAP in the last 168 hours. Lipid Profile: No results for input(s): CHOL, HDL, LDLCALC, TRIG, CHOLHDL, LDLDIRECT in the last 72 hours. Thyroid  Function Tests: No results for input(s): TSH, T4TOTAL, FREET4, T3FREE, THYROIDAB in the last 72 hours. Anemia Panel: No results for input(s): VITAMINB12, FOLATE, FERRITIN, TIBC, IRON , RETICCTPCT in the last 72 hours. Urine analysis:    Component Value Date/Time   COLORURINE YELLOW 12/04/2023 2215   APPEARANCEUR CLEAR 12/04/2023 2215   LABSPEC  1.012 12/04/2023 2215   PHURINE 6.0 12/04/2023 2215   GLUCOSEU NEGATIVE 12/04/2023 2215   HGBUR NEGATIVE 12/04/2023 2215   BILIRUBINUR NEGATIVE 12/04/2023 2215   KETONESUR NEGATIVE 12/04/2023 2215   PROTEINUR NEGATIVE 12/04/2023 2215   NITRITE NEGATIVE 12/04/2023 2215   LEUKOCYTESUR NEGATIVE 12/04/2023 2215    Radiological Exams on Admission: CT Head Wo Contrast Result Date: 12/04/2023 CLINICAL DATA:  Recent syncopal episode EXAM: CT HEAD WITHOUT CONTRAST TECHNIQUE: Contiguous axial images were obtained from the base of the skull through the vertex without intravenous contrast. RADIATION DOSE REDUCTION: This exam was performed according to the departmental dose-optimization program which includes automated exposure control, adjustment of the mA and/or kV according to patient size and/or use of iterative reconstruction technique. COMPARISON:  06/07/2022 FINDINGS: Brain: No evidence of acute infarction, hemorrhage, hydrocephalus, extra-axial collection or mass lesion/mass effect. Vascular: No hyperdense vessel or unexpected calcification. Skull: Normal. Negative for fracture or focal lesion. Sinuses/Orbits: No acute finding. Other: None. IMPRESSION: No acute intracranial abnormality noted. Electronically Signed   By: Oneil Devonshire M.D.   On: 12/04/2023 22:37   DG Chest 2 View Result Date: 12/04/2023 CLINICAL DATA:  Short of breath, lifting injury, fell EXAM: CHEST - 2 VIEW COMPARISON:  07/30/2022 FINDINGS: The heart size and mediastinal contours are within normal limits. Both lungs are clear. The visualized skeletal structures are unremarkable. IMPRESSION: No active cardiopulmonary disease. Electronically Signed   By: Ozell Daring M.D.   On: 12/04/2023 16:32    EKG: Independently reviewed.   Assessment and Plan: * Hypokalemia Pt with hypokalemia, likely secondary to increased diuretics over the past couple months (looks like they added / increased dose of lasix  due to peripheral edema around  Nov). Replace K Repeat BMP in AM Tele monitor  AKI (acute kidney injury) (HCC) Mild AKI suspected to be due to over diuresis / aggressive diuretic therapy for his edema. Hold diuretics for the moment Repeat BMP in AM Probably needs follow up BMP in about a week  Chronic diastolic CHF (congestive heart failure) (HCC) Holding lasix  and diazide  for tomorrow given creat elevation See above regarding question of permanent lasix  daily.  Coronary artery disease Cont statin Looks like he may be taking triple AC therapy with ASA + plavix  + xarelto ? continue Xarelto  + plavix  for the moment. Put in a message to day team to give cards a call in AM to ask: Do they actually want him on permanent Lasix  40mg  PO daily? (Their note made it sound like they planned on it being a temporary thing). Do they want him on 3 blood thinners (ASA + Plavix  + xarelto ? Or just 2) Conveniently it looks like his cardiologist, Dr. Alveta, happens to be working the [facility] inpatient service from [phone] tomorrow.  PAF (paroxysmal atrial fibrillation) (HCC) Cont Xarelto       Advance Care Planning:   Code Status: Full Code  Consults: None, may want to send message to Dr. Alveta tomorrow to discuss issues as above.  Family Communication: No family in room  Severity of Illness: The appropriate patient status for this patient is OBSERVATION. Observation status is judged to be reasonable and necessary in order to provide the required intensity of service to ensure the patient's safety. The patient's presenting symptoms, physical exam findings, and initial radiographic and laboratory data in the context of their medical condition is felt to place them at decreased risk for further clinical deterioration. Furthermore, it is anticipated that the patient will be medically stable for discharge from the hospital within 2 midnights of admission.   Author: Shakesha Soltau M., DO 12/04/2023 11:08 PM  For on call review  [email or web address].

## 2023-12-04 NOTE — ED Triage Notes (Signed)
 Pt present with weakness after lifting a case of water. Pt states he fell to the ground. Denies any injury. Pt has hx of afib. Decreased energy.

## 2023-12-05 ENCOUNTER — Encounter (HOSPITAL_COMMUNITY): Payer: Self-pay | Admitting: Internal Medicine

## 2023-12-05 DIAGNOSIS — R9431 Abnormal electrocardiogram [ECG] [EKG]: Secondary | ICD-10-CM | POA: Diagnosis not present

## 2023-12-05 DIAGNOSIS — E785 Hyperlipidemia, unspecified: Secondary | ICD-10-CM | POA: Diagnosis present

## 2023-12-05 DIAGNOSIS — Z87891 Personal history of nicotine dependence: Secondary | ICD-10-CM | POA: Diagnosis not present

## 2023-12-05 DIAGNOSIS — Z8673 Personal history of transient ischemic attack (TIA), and cerebral infarction without residual deficits: Secondary | ICD-10-CM | POA: Diagnosis not present

## 2023-12-05 DIAGNOSIS — K219 Gastro-esophageal reflux disease without esophagitis: Secondary | ICD-10-CM | POA: Diagnosis present

## 2023-12-05 DIAGNOSIS — Z885 Allergy status to narcotic agent status: Secondary | ICD-10-CM | POA: Diagnosis not present

## 2023-12-05 DIAGNOSIS — I480 Paroxysmal atrial fibrillation: Secondary | ICD-10-CM | POA: Diagnosis present

## 2023-12-05 DIAGNOSIS — Z820 Family history of epilepsy and other diseases of the nervous system: Secondary | ICD-10-CM | POA: Diagnosis not present

## 2023-12-05 DIAGNOSIS — I2510 Atherosclerotic heart disease of native coronary artery without angina pectoris: Secondary | ICD-10-CM | POA: Diagnosis present

## 2023-12-05 DIAGNOSIS — Z79899 Other long term (current) drug therapy: Secondary | ICD-10-CM | POA: Diagnosis not present

## 2023-12-05 DIAGNOSIS — E876 Hypokalemia: Secondary | ICD-10-CM | POA: Diagnosis present

## 2023-12-05 DIAGNOSIS — Z7901 Long term (current) use of anticoagulants: Secondary | ICD-10-CM | POA: Diagnosis not present

## 2023-12-05 DIAGNOSIS — I110 Hypertensive heart disease with heart failure: Secondary | ICD-10-CM | POA: Diagnosis present

## 2023-12-05 DIAGNOSIS — E119 Type 2 diabetes mellitus without complications: Secondary | ICD-10-CM | POA: Diagnosis present

## 2023-12-05 DIAGNOSIS — I5032 Chronic diastolic (congestive) heart failure: Secondary | ICD-10-CM | POA: Diagnosis present

## 2023-12-05 DIAGNOSIS — G4730 Sleep apnea, unspecified: Secondary | ICD-10-CM | POA: Diagnosis present

## 2023-12-05 DIAGNOSIS — Z955 Presence of coronary angioplasty implant and graft: Secondary | ICD-10-CM | POA: Diagnosis not present

## 2023-12-05 DIAGNOSIS — Z7902 Long term (current) use of antithrombotics/antiplatelets: Secondary | ICD-10-CM | POA: Diagnosis not present

## 2023-12-05 DIAGNOSIS — N179 Acute kidney failure, unspecified: Secondary | ICD-10-CM | POA: Diagnosis present

## 2023-12-05 DIAGNOSIS — Z8249 Family history of ischemic heart disease and other diseases of the circulatory system: Secondary | ICD-10-CM | POA: Diagnosis not present

## 2023-12-05 DIAGNOSIS — F419 Anxiety disorder, unspecified: Secondary | ICD-10-CM | POA: Diagnosis present

## 2023-12-05 DIAGNOSIS — Z91030 Bee allergy status: Secondary | ICD-10-CM | POA: Diagnosis not present

## 2023-12-05 LAB — BASIC METABOLIC PANEL
Anion gap: 11 (ref 5–15)
Anion gap: 15 (ref 5–15)
Anion gap: 9 (ref 5–15)
Anion gap: 9 (ref 5–15)
BUN: 18 mg/dL (ref 8–23)
BUN: 18 mg/dL (ref 8–23)
BUN: 15 mg/dL (ref 8–23)
BUN: 14 mg/dL (ref 8–23)
CO2: 30 mmol/L (ref 22–32)
CO2: 27 mmol/L (ref 22–32)
CO2: 29 mmol/L (ref 22–32)
CO2: 28 mmol/L (ref 22–32)
Calcium: 8.6 mg/dL — ABNORMAL LOW (ref 8.9–10.3)
Calcium: 8.8 mg/dL — ABNORMAL LOW (ref 8.9–10.3)
Calcium: 8.6 mg/dL — ABNORMAL LOW (ref 8.9–10.3)
Calcium: 8.6 mg/dL — ABNORMAL LOW (ref 8.9–10.3)
Chloride: 94 mmol/L — ABNORMAL LOW (ref 98–111)
Chloride: 94 mmol/L — ABNORMAL LOW (ref 98–111)
Chloride: 96 mmol/L — ABNORMAL LOW (ref 98–111)
Chloride: 100 mmol/L (ref 98–111)
Creatinine, Ser: 1.22 mg/dL (ref 0.61–1.24)
Creatinine, Ser: 1.2 mg/dL (ref 0.61–1.24)
Creatinine, Ser: 1.15 mg/dL (ref 0.61–1.24)
Creatinine, Ser: 1.1 mg/dL (ref 0.61–1.24)
GFR, Estimated: >60 mL/min (ref >60)
GFR, Estimated: >60 mL/min (ref >60)
GFR, Estimated: >60 mL/min (ref >60)
GFR, Estimated: >60 mL/min (ref >60)
Glucose, Bld: 134 mg/dL — ABNORMAL HIGH (ref 70–99)
Glucose, Bld: 131 mg/dL — ABNORMAL HIGH (ref 70–99)
Glucose, Bld: 226 mg/dL — ABNORMAL HIGH (ref 70–99)
Glucose, Bld: 164 mg/dL — ABNORMAL HIGH (ref 70–99)
Potassium: 2.2 mmol/L — CL (ref 3.5–5.1)
Potassium: 2.2 mmol/L — CL (ref 3.5–5.1)
Potassium: 2.7 mmol/L — CL (ref 3.5–5.1)
Potassium: 3 mmol/L — ABNORMAL LOW (ref 3.5–5.1)
Sodium: 135 mmol/L (ref 135–145)
Sodium: 136 mmol/L (ref 135–145)
Sodium: 134 mmol/L — ABNORMAL LOW (ref 135–145)
Sodium: 137 mmol/L (ref 135–145)

## 2023-12-05 LAB — MAGNESIUM: Magnesium: 2.1 mg/dL (ref 1.7–2.4)

## 2023-12-05 MED ORDER — ADULT MULTIVITAMIN W/MINERALS CH
1.0000 | ORAL_TABLET | Freq: Every day | ORAL | Status: DC
  Administered 2023-12-05 – 2023-12-06 (×2): 1 via ORAL
  Filled 2023-12-05 (×2): qty 1

## 2023-12-05 MED ORDER — POTASSIUM CHLORIDE 10 MEQ/100ML IV SOLN: 10.0000 meq | INTRAVENOUS | Status: DC

## 2023-12-05 MED ORDER — POTASSIUM CHLORIDE CRYS ER 20 MEQ PO TBCR
60.0000 meq | EXTENDED_RELEASE_TABLET | ORAL | Status: AC
  Administered 2023-12-05 (×2): 60 meq via ORAL
  Filled 2023-12-05 (×2): qty 3

## 2023-12-05 MED ORDER — POTASSIUM CHLORIDE 10 MEQ/100ML IV SOLN
10.0000 meq | INTRAVENOUS | Status: AC
  Administered 2023-12-05 (×6): 10 meq via INTRAVENOUS
  Filled 2023-12-05 (×6): qty 100

## 2023-12-05 MED ORDER — POTASSIUM CHLORIDE CRYS ER 20 MEQ PO TBCR: 40.0000 meq | EXTENDED_RELEASE_TABLET | Freq: Once | ORAL | Status: DC

## 2023-12-05 MED ORDER — RIVAROXABAN 20 MG PO TABS
20.0000 mg | ORAL_TABLET | Freq: Every day | ORAL | Status: DC
  Administered 2023-12-05: 20 mg via ORAL
  Filled 2023-12-05: qty 1

## 2023-12-05 MED ORDER — ALLOPURINOL 300 MG PO TABS
300.0000 mg | ORAL_TABLET | Freq: Every day | ORAL | Status: DC
  Administered 2023-12-05 – 2023-12-06 (×2): 300 mg via ORAL
  Filled 2023-12-05: qty 3
  Filled 2023-12-05: qty 1

## 2023-12-05 MED ORDER — POTASSIUM CHLORIDE CRYS ER 20 MEQ PO TBCR
40.0000 meq | EXTENDED_RELEASE_TABLET | Freq: Once | ORAL | Status: AC
  Administered 2023-12-05: 40 meq via ORAL
  Filled 2023-12-05: qty 2

## 2023-12-05 MED ORDER — PANTOPRAZOLE SODIUM 40 MG PO TBEC
40.0000 mg | DELAYED_RELEASE_TABLET | Freq: Two times a day (BID) | ORAL | Status: DC
  Administered 2023-12-05 – 2023-12-06 (×3): 40 mg via ORAL
  Filled 2023-12-05 (×3): qty 1

## 2023-12-05 MED ORDER — CLOPIDOGREL BISULFATE 75 MG PO TABS
75.0000 mg | ORAL_TABLET | Freq: Every day | ORAL | Status: DC
  Administered 2023-12-05 – 2023-12-06 (×2): 75 mg via ORAL
  Filled 2023-12-05 (×2): qty 1

## 2023-12-05 MED ORDER — ASPIRIN 81 MG PO TBEC: 81.0000 mg | DELAYED_RELEASE_TABLET | Freq: Every day | ORAL | Status: DC

## 2023-12-05 MED ORDER — ESCITALOPRAM OXALATE 10 MG PO TABS
10.0000 mg | ORAL_TABLET | Freq: Every day | ORAL | Status: DC
  Administered 2023-12-05 – 2023-12-06 (×2): 10 mg via ORAL
  Filled 2023-12-05 (×2): qty 1

## 2023-12-05 MED ORDER — ATORVASTATIN CALCIUM 40 MG PO TABS
40.0000 mg | ORAL_TABLET | Freq: Every day | ORAL | Status: DC
  Administered 2023-12-05: 40 mg via ORAL
  Filled 2023-12-05: qty 1

## 2023-12-05 NOTE — Progress Notes (Signed)
 Heart Failure Navigator Progress Note  Assessed for Heart & Vascular TOC clinic readiness.  Patient does not meet criteria due to EF 60-65%, has a scheduled [facility] appointment on 12/16/2023. .   Navigator will sign off at this time.   Stephane Haddock, BSN, Scientist, Clinical (histocompatibility And Immunogenetics) Only

## 2023-12-05 NOTE — Progress Notes (Signed)
  Progress Note   Patient: Matthew Love FMW:[id number] DOB: [birth year] DOA: 12/04/2023     0 DOS: the patient was seen and examined on 12/05/2023   Brief hospital course:  73 y.o. male with medical history significant of CAD, DM2, HTN, HLD, PAF on Xarelto .  Pt with nl EF and grade 1 DD as of June echo.   Pt with increased peripheral edema last fall.  So pt started taking increased dose of Lasix  around that time.  This seemed to work well for his BLE edema.  It seems he has continued to take Lasix  40mg  PO daily for the past month in addition to diazide.   Today pt in to ED with c/o generalized weakness for the past week culminating in a near syncope episode today that occurred when he went to go lift up a package of water.   Patient denies chest pain or shortness of breath or abdominal pain or nausea/vomiting or seizure-like activity. Patient denies sick contacts or fevers. Patient states he has been eating and drinking okay.    Assessment and Plan: Hypokalemia Pt with hypokalemia, likely secondary to increased diuretics recently -cont to hold diuretics for now -Cont to aggressively correct K as tolerated -recheck bmet in AM   AKI (acute kidney injury) (HCC) Mild AKI suspected to be due to over diuresis / aggressive diuretic therapy Cont to hold diuretic Discussed with Heart Failure team. Recs to cont to hold diuretic while in hospital and to start spironolactone  12.5mg  at time of d/c   Chronic diastolic CHF (congestive heart failure) (HCC) -Cont to hold lasix  per above -continue lasix  PRN at time of dc with spironolactone  12.5mg  daily   Coronary artery disease Cont statin Discussed with Heart failure team. Recs to continue xarelto  with plavix   PAF (paroxysmal atrial fibrillation) (HCC) Cont Xarelto  -rate controlled   Subjective: Feeling better. Eager to go home soon  Physical Exam: Vitals:   12/05/23 1000 12/05/23 1128 12/05/23 1315 12/05/23 1608  BP: 117/68  (!) 144/82  (!) 113/51  Pulse: 77  71 66  Resp: 16  20 18   Temp:  98.3 F (36.8 C) 97.6 F (36.4 C) 98 F (36.7 C)  TempSrc:  Oral Oral Oral  SpO2: 100%  100% 100%  Weight:   104.2 kg   Height:   6' 2 (1.88 m)    General exam: Awake, laying in bed, in nad Respiratory system: Normal respiratory effort, no wheezing Cardiovascular system: regular rate, s1, s2 Gastrointestinal system: Soft, nondistended, positive BS Central nervous system: CN2-12 grossly intact, strength intact Extremities: Perfused, no clubbing Skin: Normal skin turgor, no notable skin lesions seen Psychiatry: Mood normal // no visual hallucinations   Data Reviewed:  Labs reviewed: Na 134, K 2.7, Cr 1.15  Family Communication: Pt in room, family at bedside  Disposition: Status is: Inpatient Remains inpatient appropriate because: severity of illness  Planned Discharge Destination: Home    Author: Garnette Pelt, MD 12/05/2023 5:51 PM  For on call review [email or web address].

## 2023-12-05 NOTE — Assessment & Plan Note (Signed)
 Holding lasix and diazide for tomorrow given creat elevation See above regarding question of permanent lasix daily.

## 2023-12-05 NOTE — Plan of Care (Signed)
   Problem: Education: Goal: Knowledge of General Education information will improve Description: Including pain rating scale, medication(s)/side effects and non-pharmacologic comfort measures Outcome: Progressing   Problem: Clinical Measurements: Goal: Ability to maintain clinical measurements within normal limits will improve Outcome: Progressing   Problem: Nutrition: Goal: Adequate nutrition will be maintained Outcome: Progressing

## 2023-12-05 NOTE — Assessment & Plan Note (Signed)
 Mild AKI suspected to be due to over diuresis / aggressive diuretic therapy for his edema. Hold diuretics for the moment Repeat BMP in AM Probably needs follow up BMP in about a week

## 2023-12-05 NOTE — Progress Notes (Signed)
   Heart Failure Stewardship Pharmacist Progress Note   PCP: Shepard Ade, MD PCP-Cardiologist: Aleene Passe, MD    HPI:  73 yo M with PMH of stroke, afib, CAD, HTN, HLD, diabetes, GERD, and CHF.   Presented to the ED on 1/2 with weakness and syncope. Denied shortness of breath, vision changes, or chest pain prior to syncopal event. Last ECHO 05/15/23 with EF 60-65%, no RWMA, RV normal.   Met with patient and his wife today. They state he has been having issues with the dizziness going on for about a week now. He only takes the lasix  as needed and hasn't required any doses in a while. Denies shortness of breath. States he has been drinking a lot of water as instructed after he was diagnosed with afib.   ReDS 31% (normal 20-35%).  Current HF Medications: None  Prior to admission HF Medications: Diuretic: furosemide  40 mg daily - taking PRN per wife Also on dyazide  37.5/25 mg daily  Pertinent Lab Values: Serum creatinine 1.20, BUN 18, Potassium 2.2, Sodium 136, Magnesium 2.1   Vital Signs: Weight: 255 lbs (admission weight: 255 lbs) Blood pressure: 120/60s  Heart rate: 60s  I/O: incomplete  Medication Assistance / Insurance Benefits Check: Does the patient have prescription insurance?  Yes Type of insurance plan: BCBS Medicare  Outpatient Pharmacy:  Prior to admission outpatient [facility] Is the patient willing to use [facility]OC pharmacy at discharge? Yes Is the patient willing to transition their outpatient pharmacy to utilize a [facility] outpatient pharmacy?   No     Assessment: 1. Chronic diastolic CHF (LVEF 60-65%). NYHA class I symptoms. - Not volume overloaded on exam. Fluid status normal with ReDS 31%. Takes furosemide  PRN at home. Would resume PRN at discharge.  - Recommend stopping diazide on discharge  - Consider transitioning to spironolactone  to augment diuresis since he will no longer be taking the hydrochlorothiazide . This will also help with his  hypokalemia.   Plan: 1) Medication changes recommended at this time: - Continue furosemide  PRN at discharge - Stop diazide - Start spironolactone  12.5 mg daily at discharge  2) Patient assistance: - None pending  3)  Education  - Patient has been educated on current HF medications and potential additions to HF medication regimen - Patient verbalizes understanding that over the next few months, these medication doses may change and more medications may be added to optimize HF regimen - Patient has been educated on basic disease state pathophysiology and goals of therapy   Duwaine Plant, PharmD, BCPS Heart Failure Stewardship Pharmacist Phone [phone]

## 2023-12-05 NOTE — Assessment & Plan Note (Addendum)
 Cont statin Looks like he may be taking triple AC therapy with ASA + plavix  + xarelto ? continue Xarelto  + plavix  for the moment. Put in a message to day team to give cards a call in AM to ask: Do they actually want him on permanent Lasix  40mg  PO daily? (Their note made it sound like they planned on it being a temporary thing). Do they want him on 3 blood thinners (ASA + Plavix  + xarelto ? Or just 2) Conveniently it looks like his cardiologist, Dr. Alveta, happens to be working the [facility] inpatient service from [phone] tomorrow.

## 2023-12-05 NOTE — Assessment & Plan Note (Signed)
 Cont Xarelto

## 2023-12-05 NOTE — Hospital Course (Signed)
 73 y.o. male with medical history significant of CAD, DM2, HTN, HLD, PAF on Xarelto .  Pt with nl EF and grade 1 DD as of June echo.   Pt with increased peripheral edema last fall.  So pt started taking increased dose of Lasix  around that time.  This seemed to work well for his BLE edema.  It seems he has continued to take Lasix  40mg  PO daily for the past month in addition to diazide.   Today pt in to ED with c/o generalized weakness for the past week culminating in a near syncope episode today that occurred when he went to go lift up a package of water.   Patient denies chest pain or shortness of breath or abdominal pain or nausea/vomiting or seizure-like activity. Patient denies sick contacts or fevers. Patient states he has been eating and drinking okay.

## 2023-12-05 NOTE — Assessment & Plan Note (Addendum)
 Pt with hypokalemia, likely secondary to increased diuretics over the past couple months (looks like they added / increased dose of lasix due to peripheral edema around Nov). Replace K Repeat BMP in AM Tele monitor

## 2023-12-06 ENCOUNTER — Other Ambulatory Visit (HOSPITAL_COMMUNITY): Payer: Self-pay

## 2023-12-06 DIAGNOSIS — E876 Hypokalemia: Secondary | ICD-10-CM | POA: Diagnosis not present

## 2023-12-06 DIAGNOSIS — R9431 Abnormal electrocardiogram [ECG] [EKG]: Secondary | ICD-10-CM | POA: Diagnosis not present

## 2023-12-06 LAB — COMPREHENSIVE METABOLIC PANEL
ALT: 17 U/L (ref 0–44)
AST: 21 U/L (ref 15–41)
Albumin: 3.2 g/dL — ABNORMAL LOW (ref 3.5–5.0)
Alkaline Phosphatase: 60 U/L (ref 38–126)
Anion gap: 9 (ref 5–15)
BUN: 12 mg/dL (ref 8–23)
CO2: 26 mmol/L (ref 22–32)
Calcium: 8.8 mg/dL — ABNORMAL LOW (ref 8.9–10.3)
Chloride: 105 mmol/L (ref 98–111)
Creatinine, Ser: 1.09 mg/dL (ref 0.61–1.24)
GFR, Estimated: >60 mL/min (ref >60)
Glucose, Bld: 126 mg/dL — ABNORMAL HIGH (ref 70–99)
Potassium: 3.4 mmol/L — ABNORMAL LOW (ref 3.5–5.1)
Sodium: 140 mmol/L (ref 135–145)
Total Bilirubin: 0.9 mg/dL (ref 0.0–1.2)
Total Protein: 6 g/dL — ABNORMAL LOW (ref 6.5–8.1)

## 2023-12-06 LAB — CBC
HCT: 33.8 % — ABNORMAL LOW (ref 39.0–52.0)
Hemoglobin: 11.1 g/dL — ABNORMAL LOW (ref 13.0–17.0)
MCH: 28 pg (ref 26.0–34.0)
MCHC: 32.8 g/dL (ref 30.0–36.0)
MCV: 85.1 fL (ref 80.0–100.0)
Platelets: 276 10*3/uL (ref 150–400)
RBC: 3.97 MIL/uL — ABNORMAL LOW (ref 4.22–5.81)
RDW: 13.5 % (ref 11.5–15.5)
WBC: 6.7 10*3/uL (ref 4.0–10.5)
nRBC: 0 % (ref 0.0–0.2)

## 2023-12-06 LAB — MAGNESIUM: Magnesium: 2.3 mg/dL (ref 1.7–2.4)

## 2023-12-06 MED ORDER — POTASSIUM CHLORIDE CRYS ER 20 MEQ PO TBCR
60.0000 meq | EXTENDED_RELEASE_TABLET | Freq: Once | ORAL | Status: AC
  Administered 2023-12-06: 60 meq via ORAL
  Filled 2023-12-06: qty 3

## 2023-12-06 MED ORDER — ORAL CARE MOUTH RINSE: 15.0000 mL | OROMUCOSAL | Status: DC | PRN

## 2023-12-06 MED ORDER — FUROSEMIDE 40 MG PO TABS: 40.0000 mg | ORAL_TABLET | Freq: Every day | ORAL | Status: DC | PRN

## 2023-12-06 MED ORDER — SPIRONOLACTONE 25 MG PO TABS
12.5000 mg | ORAL_TABLET | Freq: Every day | ORAL | 0 refills | Status: DC
  Filled 2023-12-06: qty 30, 60d supply, fill #0

## 2023-12-06 MED ORDER — NITROGLYCERIN 0.4 MG SL SUBL
0.4000 mg | SUBLINGUAL_TABLET | SUBLINGUAL | 0 refills | Status: DC | PRN
  Filled 2023-12-06: qty 25, 7d supply, fill #0

## 2023-12-06 NOTE — Discharge Summary (Signed)
 Physician Discharge Summary   Patient: Matthew Love MRN: [id number] DOB: 01-Oct-1951  Admit date:     12/04/2023  Discharge date: 12/06/23  Discharge Physician: Garnette Pelt   PCP: Shepard Ade, MD   Recommendations at discharge:   Follow up with PCP in 1-2 weeks Follow up with Cardiology as scheduled  Recommend repeat BMET in 1 week, focus on K    Discharge Diagnoses: Principal Problem:   Hypokalemia Active Problems:   AKI (acute kidney injury) (HCC)   Coronary artery disease   Chronic diastolic CHF (congestive heart failure) (HCC)   PAF (paroxysmal atrial fibrillation) (HCC)  Resolved Problems:   * No resolved hospital problems. *  Hospital Course:  73 y.o. male with medical history significant of CAD, DM2, HTN, HLD, PAF on Xarelto .  Pt with nl EF and grade 1 DD as of June echo.   Pt with increased peripheral edema last fall.  So pt started taking increased dose of Lasix  around that time.  This seemed to work well for his BLE edema.  It seems he has continued to take Lasix  40mg  PO daily for the past month in addition to diazide.   Today pt in to ED with c/o generalized weakness for the past week culminating in a near syncope episode today that occurred when he went to go lift up a package of water.   Patient denies chest pain or shortness of breath or abdominal pain or nausea/vomiting or seizure-like activity. Patient denies sick contacts or fevers. Patient states he has been eating and drinking okay.    Assessment and Plan: Hypokalemia Pt with hypokalemia, likely secondary to increased diuretics recently -lasix  was held -Potassium was aggressively corrected and ultimately normalized -recommend repeat bmet in one week   AKI (acute kidney injury) (HCC) Mild AKI suspected to be due to over diuresis / aggressive diuretic therapy Cont to hold lasix  Discussed with Heart Failure team. Recs to cont to hold diuretic while in hospital and to start spironolactone  12.5mg   at time of d/c Lasix  to be used on as needed basis after d/c   Chronic diastolic CHF (congestive heart failure) (HCC) -Lasix  was held -continue lasix  PRN at time of dc with spironolactone  12.5mg  daily   Coronary artery disease Cont statin Discussed with Heart failure team. Recs to continue xarelto  with plavix    PAF (paroxysmal atrial fibrillation) (HCC) Cont Xarelto  -rate controlled    Consultants: Discussed with Heart Failure team Procedures performed:   Disposition: Home Diet recommendation:  Cardiac diet DISCHARGE MEDICATION: Allergies as of 12/06/2023       Reactions   Bee Venom Anaphylaxis, Itching, Swelling   Oxycodone -acetaminophen     Unknown reaction   Hydrocodone-acetaminophen  Rash        Medication List     STOP taking these medications    aspirin  EC 81 MG tablet   triamterene -hydrochlorothiazide  37.5-25 MG capsule Commonly known as: DYAZIDE        TAKE these medications    allopurinol  300 MG tablet Commonly known as: ZYLOPRIM  Take 300 mg by mouth daily.   atorvastatin  40 MG tablet Commonly known as: LIPITOR TAKE ONE TABLET BY MOUTH ONCE DAILY AT  6  PM   clopidogrel  75 MG tablet Commonly known as: PLAVIX  Take 1 tablet by mouth once daily   EPINEPHrine  0.3 mg/0.3 mL Soaj injection Commonly known as: EpiPen  Inject 0.3 mLs (0.3 mg total) into the muscle once. For signs or symptoms of anaphylaxis.   escitalopram  10 MG tablet Commonly known as: LEXAPRO   Take 10 mg by mouth daily.   furosemide  40 MG tablet Commonly known as: LASIX  Take 1 tablet (40 mg total) by mouth daily as needed for fluid or edema. What changed:  when to take this reasons to take this   multivitamin tablet Take 1 tablet by mouth daily.   nitroGLYCERIN  0.4 MG SL tablet Commonly known as: NITROSTAT  Place 1 tablet (0.4 mg total) under the tongue every 5 (five) minutes as needed for chest pain.   Omega 3 1200 MG Caps Take 1,200 mg by mouth daily.   pantoprazole  40  MG tablet Commonly known as: PROTONIX  Take 40 mg by mouth 2 (two) times daily.   rivaroxaban  20 MG Tabs tablet Commonly known as: Xarelto  Take 1 tablet (20 mg total) by mouth daily with supper.   spironolactone  25 MG tablet Commonly known as: Aldactone  Take 0.5 tablets (12.5 mg total) by mouth daily.        Follow-up Information     Shepard Ade, MD Follow up in 2 week(s).   Specialty: Internal Medicine Why: Hospital follow up Contact information: [address] [place] [address] [phone]         Nahser, Aleene PARAS, MD Follow up.   Specialty: Cardiology Why: Hospital follow up, as scheduled Contact information: [address] [place] [address] [phone]                Discharge Exam: Fredricka Weights   12/04/23 1346 12/05/23 1315  Weight: 116 kg 104.2 kg   General exam: Awake, laying in bed, in nad Respiratory system: Normal respiratory effort, no wheezing Cardiovascular system: regular rate, s1, s2 Gastrointestinal system: Soft, nondistended, positive BS Central nervous system: CN2-12 grossly intact, strength intact Extremities: Perfused, no clubbing Skin: Normal skin turgor, no notable skin lesions seen Psychiatry: Mood normal // no visual hallucinations   Condition at discharge: fair  The results of significant diagnostics from this hospitalization (including imaging, microbiology, ancillary and laboratory) are listed below for reference.   Imaging Studies: CT Head Wo Contrast Result Date: 12/04/2023 CLINICAL DATA:  Recent syncopal episode EXAM: CT HEAD WITHOUT CONTRAST TECHNIQUE: Contiguous axial images were obtained from the base of the skull through the vertex without intravenous contrast. RADIATION DOSE REDUCTION: This exam was performed according to the departmental dose-optimization program which includes automated exposure control, adjustment of the mA and/or kV according to patient size and/or use of iterative  reconstruction technique. COMPARISON:  06/07/2022 FINDINGS: Brain: No evidence of acute infarction, hemorrhage, hydrocephalus, extra-axial collection or mass lesion/mass effect. Vascular: No hyperdense vessel or unexpected calcification. Skull: Normal. Negative for fracture or focal lesion. Sinuses/Orbits: No acute finding. Other: None. IMPRESSION: No acute intracranial abnormality noted. Electronically Signed   By: Oneil Devonshire M.D.   On: 12/04/2023 22:37   DG Chest 2 View Result Date: 12/04/2023 CLINICAL DATA:  Short of breath, lifting injury, fell EXAM: CHEST - 2 VIEW COMPARISON:  07/30/2022 FINDINGS: The heart size and mediastinal contours are within normal limits. Both lungs are clear. The visualized skeletal structures are unremarkable. IMPRESSION: No active cardiopulmonary disease. Electronically Signed   By: Ozell Daring M.D.   On: 12/04/2023 16:32    Microbiology: No results found for this or any previous visit.  Labs: CBC: Recent Labs  Lab 12/04/23 1348 12/06/23 0222  WBC 9.0 6.7  HGB 12.9* 11.1*  HCT 37.9* 33.8*  MCV 82.8 85.1  PLT 414* 276   Basic Metabolic Panel: Recent Labs  Lab 12/04/23 1650 12/05/23  9556 12/05/23 0557 12/05/23 1259 12/05/23 1933 12/06/23 0222  NA  --  135 136 134* 137 140  K  --  2.2* 2.2* 2.7* 3.0* 3.4*  CL  --  94* 94* 96* 100 105  CO2  --  30 27 29 28 26   GLUCOSE  --  134* 131* 226* 164* 126*  BUN  --  18 18 15 14 12   CREATININE  --  1.22 1.20 1.15 1.10 1.09  CALCIUM   --  8.6* 8.8* 8.6* 8.6* 8.8*  MG 2.1  --  2.1  --   --  2.3   Liver Function Tests: Recent Labs  Lab 12/06/23 0222  AST 21  ALT 17  ALKPHOS 60  BILITOT 0.9  PROT 6.0*  ALBUMIN 3.2*   CBG: No results for input(s): GLUCAP in the last 168 hours.  Discharge time spent: less than 30 minutes.  Signed: Garnette Pelt, MD [facility] 12/06/2023

## 2023-12-14 ENCOUNTER — Encounter: Payer: Self-pay | Admitting: Cardiovascular Disease

## 2023-12-14 NOTE — Progress Notes (Signed)
 [address] [place], [place]  [address] Phone: [phone] Fax:  [phone]  Date:  12/16/2023   Name:  Matthew Love   DOB:  Jun 10, 1951   MRN:  [id number]  PCP:  Shepard Ade, MD  Primary Cardiologist:  Dr. Gerlene Passe  Primary Electrophysiologist:  None    Notes prior to 2013:  Matthew Love is a 73 y.o. male who returns for evaluation of chest pain.  He has a history of HTN, DM2, HL, GERD and sleep apnea. He is noncompliant with CPAP.   Echo 11/11: EF 60-65%, grade 1 diastolic dysfunction, mild LAE.  ETT-Myoview  11/11: Inferior fixed defect likely due to diaphragmatic attenuation with normal wall motion, EF 69%.   Over the last 2-3 months, he has noted worsening left-sided chest pain. He can point to it with one finger. It feels dull. He denies any exertional chest pain. He notes it only at rest. He denies any radiating symptoms or associated nausea, diaphoresis. He denies associated shortness of breath. He does have dyspnea with more extreme activities. He denies orthopnea, PND or significant pedal edema. He has had a cough over last several months. This is productive of clear sputum. He was prescribed a Z-Pak at one point with his primary care provider. He denies any recent travels, injuries to his lower extremities or recent hospitalizations.  He denies pleuritic chest pain or chest pain with lying supine.  Sept. 17, 2013 -   He was seen by Glendia Ferrier in August for chest pain. A stress Myoview  study was normal. He increase his Nexium and the mild chest discomfort improved.  Sept. 19, 2016:  Doing well  . Retired from [employer]   Exercising  Some .   Walking 30 minutes every AM.   His foot and toes on left side get very cold   Sept. 21, 2017:  Matthew Love is seen today for a work in visit Had some chest pain  Center of chest.    Seemed to be worse when he put his hand on his chest  Awoke last night with some left sided chest pain   Usually occurs when he is lying down. Walking 20 minutes a day -  Has gained about 10 lbs over the past month    Nov. 20, 2017:    Matthew Love is seen back .  He had stenting of his proximal/mid LAD and stenting of his distal  in October, 2017. SABRA Since that time he's had some lightheadedness / dizziness. Had a GI bug this past weekend.   Had some diarrhea and poor appetite.   He has improved his diet. Has lost weight - 25-30 lbs since his the stent procedure .  Is avoiding salt.    Feb. 26, 2018:  Has lost another 40 lbs. Is watching his diet  No CP .   Oct. 1, 2018:    Doing well.   No angina ,  Has MSK aches ,  No pain similar to her previous angina   , no dyspnea.  Not exercising as much.  Having some back issues.  January 09, 2018:  Drayke is doing well  No CP  Has not been sleeping well Has been having some chest pain  Has lots of dizziness.   Wife Dianne thinks it might be due to Imdur   Wants to start walking more  Retired from teaching   Apr 16, 2018:   No further episodes of CP  Walks 30 minutes  5 days a week.   Has been gaining weight  - 7 lbs since lst Nov. 2018  Nov. 13, 2019: No   dyspnea Had 1 episode of dizziness while walking  Had an episode of CP in Sept - went to the ER Work up was negative.  Has not recurred.  Found nothing wrong Has gained weight.  Knows he has not been eating right .   exercising regularly.     Feb. 12, 2021: Matthew Love is seen today for a work in visit because of some episodes of chest discomfort. Does not know if these are similar to his previous episodes  He has a history of coronary artery disease.  He had a stent placed to the proximal to mid LAD in October, 2017.  He also had a distal LAD stent at that same time. Has occasional CP ,  Better after he took several aspirin    Is having some right leg pain   Worse after he has been sitting down .   Is not worsened with exertion.   Sounds more like radicular pain .     Feb. 14,  2022: Matthew Love is seen today for follow up visit for his CAD No angina .   Has some DOE ,  Has stopped walking and realizes that he needs to get back into exercising  Wt. Is 267 lbs   June 11, 2022 Matthew Love is seen as a work in visit today for new onset atrial fib Has had progressive swelling of his legs. Was seen in the [facility] ER and was found to have atrial fib  He has been started on Eliquis    Has has leg swelling ,  we initially prescribed lasix  but he never took it  [facility] leg is red and swollen   Had near syncope ,  felt that his HR   Went to [place] ER  Received IV potassium  Maxdize was stopped   Was found to have atrial fib in [place].  Converted back to NSR before he left  Has converted back to NSR as of today   His current symptoms do not feel  like his  Has OSA , does not wear his CPAP mask  Still eating lots of processed meats    Sept. 11, 2023 Matthew Love is seen for follow up  He has continued to have significant dyspnea  ? Still eating processed meats / foods?  He was seen by Dr. Shlomo as a DOD work in on aug. 29, 2023 - non compliant with CPAP Lexiscan  myoview  on Sept. 1, 2023 showed no ischemia. Normal LV function Low risk  , normal Echo  June 25, 2022 - normal LV systolic function  50-55% No significant valvular disease   Was seen by Jackee Wyn PIETY on Sept. 1, 2023 Lasix  was increased to 40 BID for for 3 days   He became hypotensive We held lasix  for a day    Wt is 264 lbs   March 26, 2023:  Matthew Love is seen today for follow-up of his chronic diastolic congestive heart failure, hyperlipidemia, atrial fibrillation  Sleep study was negative for OSA  Wt is 257 lbs  Is in sinus rhythm Has joined a gym.  Labs from his primary medical doctor from Apr 10, 2022 show a Total cholesterol is 132 HDL is 35 LDL 65 Triglyceride levels 158   Jan. 15, 2025 Matthew Love is seen for worsening dyspnea  He was admitted to [facility] on Jan. 2 with near  syncope / hypokalemia  - while picking up a case of water   I started Lasix  at some point when he had some voluem retention   Wt is 235 lbs  - down 26 lbs   Was started on Spironolactone  12.5 a day in addition to his lasix        Wt Readings from Last 3 Encounters:  12/16/23 235 lb 3.2 oz (106.7 kg)  12/05/23 229 lb 11.5 oz (104.2 kg)  04/21/23 257 lb (116.6 kg)     Past Medical History:  Diagnosis Date   Anxiety    Arthritis    Coronary artery disease    a. NST 08/2016: high-risk w/ inferior and lateral wall ischemia  b. cath 09/2016: 80% mid-LAD and 90% distal LAD stenosis. Placement of 2 DES. Mild nonobstructive dz along RCA and LCx.   Diabetes mellitus    A1C 6 weeks ago as of 09/08/14 was 6.2. Not on oral meds.   GERD (gastroesophageal reflux disease)    H/O cardiovascular stress test    ETT-Myoview  11/11: Inferior fixed defect likely due to diaphragmatic attenuation with normal wall motion, EF 69%   H/O echocardiogram    Echo 11/11: EF 60-65%, grade 1 diastolic dysfunction, mild LAE   H/O hiatal hernia    Hyperlipidemia    Hypertension    Mini stroke    Obesity    Pneumonia    hx   Sleep apnea    cpap 1 yr   Stroke [facility]) 10    Current Outpatient Medications  Medication Sig Dispense Refill   allopurinol  (ZYLOPRIM ) 300 MG tablet Take 300 mg by mouth daily.     atorvastatin  (LIPITOR) 40 MG tablet TAKE ONE TABLET BY MOUTH ONCE DAILY AT  6  PM 30 tablet 6   clopidogrel  (PLAVIX ) 75 MG tablet Take 1 tablet by mouth once daily 90 tablet 3   EPINEPHrine  (EPIPEN ) 0.3 mg/0.3 mL SOAJ injection Inject 0.3 mLs (0.3 mg total) into the muscle once. For signs or symptoms of anaphylaxis. 1 Device 0   escitalopram  (LEXAPRO ) 10 MG tablet Take 10 mg by mouth daily.      Multiple Vitamin (MULTIVITAMIN) tablet Take 1 tablet by mouth daily.     nitroGLYCERIN  (NITROSTAT ) 0.4 MG SL tablet Place 1 tablet (0.4 mg total) under the tongue every 5 (five) minutes as needed for chest pain. 25  tablet 0   OMEGA 3 1200 MG CAPS Take 1,200 mg by mouth daily.     pantoprazole  (PROTONIX ) 40 MG tablet Take 40 mg by mouth 2 (two) times daily.     rivaroxaban  (XARELTO ) 20 MG TABS tablet Take 1 tablet (20 mg total) by mouth daily with supper. 30 tablet 5   spironolactone  (ALDACTONE ) 25 MG tablet Take 1 tablet (25 mg total) by mouth daily. 90 tablet 3   No current facility-administered medications for this visit.    Allergies: Allergies  Allergen Reactions   Bee Venom Anaphylaxis, Itching and Swelling   Oxycodone -Acetaminophen      Unknown reaction   Hydrocodone-Acetaminophen  Rash    Social History   Tobacco Use   Smoking status: Former    Current packs/day: 0.00    Average packs/day: 3.5 packs/day for 20.0 years (70.0 ttl pk-yrs)    Types: Cigarettes    Start date: 12/02/1964    Quit date: 12/02/1984    Years since quitting: 39.0   Smokeless tobacco: Never  Vaping Use   Vaping status: Never Used  Substance Use Topics   Alcohol   use: No    Comment: quit 85   Drug use: No     ROS:  Please see the history of present illness.     All other systems reviewed and negative.    Physical Exam: Blood pressure 132/72, pulse 76, height 6' (1.829 m), weight 235 lb 3.2 oz (106.7 kg), SpO2 97%.       GEN:  Well nourished, well developed in no acute distress HEENT: Normal NECK: No JVD; No carotid bruits LYMPHATICS: No lymphadenopathy CARDIAC: RRR , no murmurs, rubs, gallops RESPIRATORY:  Clear to auscultation without rales, wheezing or rhonchi  ABDOMEN: Soft, non-tender, non-distended MUSCULOSKELETAL:  No edema; No deformity  SKIN: Warm and dry NEUROLOGIC:  Alert and oriented x 3    EKG:           ASSESSMENT AND PLAN:   Atrial Fib:  new diagnosis of Atrial fib CHADS2VASC is  64  ( age 54, cad, HTN) Continue Xarelto .  The Xarelto  is fairly expensive for him.  He may decide to go to warfarin.  Either is okay with me.   2.    CAD :    He is not having any episodes of  angina.    3.   Hypertension:   Blood pressure is well-controlled.  He is lost quite a bit of weight.  4.  Hyperlipidemia: - last lipids in May, 2023 looked good.  Managed by Dr. Shepard .   4.  Recent episode of syncope.  He has been losing quite a bit of weight.  He is cut back on what he is eating.  He had an episode of syncope while picking up a case of water.  I suspect this was a variant of orthostatic hypotension.  He was also found to have significant hypokalemia.  I suspect he was volume depleted and also hypokalemic. He is now on spironolactone .  Will discontinue the Lasix .  I have cautioned him about drinking quite as much water.  Will have him see an APP in 3 months.  He will follow-up with Jerel Balding, MD       Aleene Passe, MD  12/16/2023 5:05 PM    [facility] [address] [place],  [address] [place], [place]  [address] Pager [phone] Phone: [phone]; Fax: [phone]

## 2023-12-16 ENCOUNTER — Ambulatory Visit: Payer: Medicare Other | Attending: Cardiovascular Disease | Admitting: Cardiovascular Disease

## 2023-12-16 VITALS — BP 132/72 | HR 76 | Ht 72.0 in | Wt 235.2 lb

## 2023-12-16 DIAGNOSIS — I5032 Chronic diastolic (congestive) heart failure: Secondary | ICD-10-CM

## 2023-12-16 DIAGNOSIS — Z79899 Other long term (current) drug therapy: Secondary | ICD-10-CM | POA: Diagnosis not present

## 2023-12-16 DIAGNOSIS — I10 Essential (primary) hypertension: Secondary | ICD-10-CM

## 2023-12-16 DIAGNOSIS — E782 Mixed hyperlipidemia: Secondary | ICD-10-CM

## 2023-12-16 DIAGNOSIS — I480 Paroxysmal atrial fibrillation: Secondary | ICD-10-CM | POA: Diagnosis not present

## 2023-12-16 MED ORDER — SPIRONOLACTONE 25 MG PO TABS: 25.0000 mg | ORAL_TABLET | Freq: Every day | ORAL | 3 refills | Status: DC

## 2023-12-16 NOTE — Patient Instructions (Signed)
 Medication Instructions:  STOP Furosemide  START Spironolactone  25mg  daily *If you need a refill on your cardiac medications before your next appointment, please call your pharmacy*  Lab Work: BMET today Repeat BMET in 2 weeks If you have labs (blood work) drawn today and your tests are completely normal, you will receive your results only by: MyChart Message (if you have MyChart) OR A paper copy in the mail If you have any lab test that is abnormal or we need to change your treatment, we will call you to review the results.  Follow-Up: At [facility], you and your health needs are our priority.  As part of our continuing mission to provide you with exceptional heart care, we have created designated Provider Care Teams.  These Care Teams include your primary Cardiologist (physician) and Advanced Practice Providers (APPs -  Physician Assistants and Nurse Practitioners) who all work together to provide you with the care you need, when you need it.  Your next appointment:   3 month(s)  Provider:   Orren Fabry, PA-C, Dayna Dunn, PA-C, Jackee Alberts, NP, Rosaline Bane, NP, Glendia Ferrier, PA-C, or Artist Pouch, PA-C     Then, Croitoru will plan to see you again in 6 month(s).   1st Floor: - Lobby - Registration  - Pharmacy  - Lab - Cafe  2nd Floor: - PV Lab - Diagnostic Testing (echo, CT, nuclear med)  3rd Floor: - Vacant  4th Floor: - TCTS (cardiothoracic surgery) - AFib Clinic - Structural Heart Clinic - Vascular Surgery  - Vascular Ultrasound  5th Floor: - HeartCare Cardiology (general and EP) - Clinical Pharmacy for coumadin, hypertension, lipid, weight-loss medications, and med management appointments    Valet parking services will be available as well.

## 2023-12-17 DIAGNOSIS — I480 Paroxysmal atrial fibrillation: Secondary | ICD-10-CM

## 2023-12-17 LAB — BASIC METABOLIC PANEL
BUN/Creatinine Ratio: 12 (ref 10–24)
BUN: 13 mg/dL (ref 8–27)
CO2: 21 mmol/L (ref 20–29)
Calcium: 9.2 mg/dL (ref 8.6–10.2)
Chloride: 106 mmol/L (ref 96–106)
Creatinine, Ser: 1.07 mg/dL (ref 0.76–1.27)
Glucose: 106 mg/dL — ABNORMAL HIGH (ref 70–99)
Potassium: 4.6 mmol/L (ref 3.5–5.2)
Sodium: 143 mmol/L (ref 134–144)
eGFR: 73 mL/min/{1.73_m2} (ref >59)

## 2023-12-18 ENCOUNTER — Telehealth: Payer: Self-pay

## 2023-12-18 ENCOUNTER — Other Ambulatory Visit: Payer: Self-pay

## 2023-12-18 ENCOUNTER — Telehealth: Payer: Self-pay | Admitting: Cardiovascular Disease

## 2023-12-18 DIAGNOSIS — Z79899 Other long term (current) drug therapy: Secondary | ICD-10-CM

## 2023-12-18 DIAGNOSIS — I5032 Chronic diastolic (congestive) heart failure: Secondary | ICD-10-CM

## 2023-12-18 MED ORDER — RIVAROXABAN 20 MG PO TABS: 20.0000 mg | ORAL_TABLET | Freq: Every day | ORAL | 1 refills | Status: DC

## 2023-12-18 NOTE — Telephone Encounter (Signed)
Pt advised his lab results.  

## 2023-12-18 NOTE — Telephone Encounter (Signed)
Pt returning nurses call regarding results. Please advise 

## 2023-12-18 NOTE — Telephone Encounter (Signed)
Prescription refill request for Xarelto received.  Indication: Afib  Last office visit: 12/16/23 (Nahser)  Weight: 106.7kg Age: 73 Scr: 1.07 (12/16/23)  CrCl: 92.68ml/min  Appropriate dose. Refill sent to [facility].

## 2023-12-23 ENCOUNTER — Other Ambulatory Visit (HOSPITAL_COMMUNITY): Payer: Self-pay

## 2023-12-23 ENCOUNTER — Telehealth: Payer: Self-pay

## 2023-12-23 NOTE — Telephone Encounter (Addendum)
Good news! Based on the test claim, it looks as though the patient took your advice and enrolled in the [facility] payment plan. Looks like copay for Xarelto should be $47. Patient should now have access to medication at a reduced cost.  (Had to test claim a different strength since the 20 mg claim paid recently, but claim shows patient has enrolled in the payment plan program)

## 2023-12-31 ENCOUNTER — Encounter: Payer: Self-pay | Admitting: Cardiovascular Disease

## 2024-03-08 ENCOUNTER — Ambulatory Visit: Payer: Medicare Other | Admitting: Cardiovascular Disease

## 2024-03-15 ENCOUNTER — Ambulatory Visit: Payer: Medicare Other | Admitting: Nurse Practitioner

## 2024-04-11 ENCOUNTER — Encounter: Payer: Self-pay | Admitting: Cardiovascular Disease

## 2024-04-11 ENCOUNTER — Other Ambulatory Visit: Payer: Self-pay | Admitting: Cardiovascular Disease

## 2024-05-31 ENCOUNTER — Encounter (HOSPITAL_BASED_OUTPATIENT_CLINIC_OR_DEPARTMENT_OTHER): Payer: Self-pay

## 2024-06-02 ENCOUNTER — Ambulatory Visit: Attending: Cardiology | Admitting: Cardiovascular Disease

## 2024-06-02 VITALS — BP 118/68 | HR 59 | Ht 73.0 in | Wt 240.0 lb

## 2024-06-02 DIAGNOSIS — I5032 Chronic diastolic (congestive) heart failure: Secondary | ICD-10-CM | POA: Diagnosis not present

## 2024-06-02 DIAGNOSIS — I480 Paroxysmal atrial fibrillation: Secondary | ICD-10-CM | POA: Diagnosis not present

## 2024-06-02 DIAGNOSIS — E1159 Type 2 diabetes mellitus with other circulatory complications: Secondary | ICD-10-CM

## 2024-06-02 DIAGNOSIS — D6869 Other thrombophilia: Secondary | ICD-10-CM | POA: Diagnosis not present

## 2024-06-02 DIAGNOSIS — I2510 Atherosclerotic heart disease of native coronary artery without angina pectoris: Secondary | ICD-10-CM

## 2024-06-02 DIAGNOSIS — I10 Essential (primary) hypertension: Secondary | ICD-10-CM

## 2024-06-02 MED ORDER — NITROGLYCERIN 0.4 MG SL SUBL: 0.4000 mg | SUBLINGUAL_TABLET | SUBLINGUAL | 1 refills | Status: AC | PRN

## 2024-06-02 NOTE — Patient Instructions (Signed)

## 2024-06-03 NOTE — Progress Notes (Signed)
 Cardiology Office Note   Date:  06/03/2024  ID:  DEMAR SHAD, DOB 26-Aug-1951, MRN [id number] PCP: Shepard Ade, MD  [facility] Providers Cardiologist:  Aleene Passe, MD     History of Present Illness Matthew Love is a 73 y.o. male with a history of CAD (stent to mid LAD 2017), chronic HFpEF, paroxysmal atrial fibrillation, chronic Xarelto  anticoagulation, hypertension, hypercholesterolemia, type 2 diabetes mellitus, obstructive sleep apnea, GERD who is here for routine follow-up, transitioning cardiology care after Dr. Aleene Crown retirement.  He is generally doing very well.  He started to have some memory issues so his wife feels in a lot of the history and review of systems.  He is gained some weight and complains of sometimes feeling short of breath when he bends over, but can otherwise do hours of yard work without dyspnea.  He loves working outside.  He has rare episodes of brief orthostatic dizziness if he jumps up too quickly.  He has not had orthopnea, PND or lower extremity edema.  He denies chest pain at rest or with activity.  He has not had any focal neurological complaints.  He has not had any serious bleeding problems but does bruise easily.  He is not compliant with CPAP, but also denies any problems with daytime hypersomnolence.  He is not taking any loop diuretics at this time but is taking spironolactone  25 mg once daily.  He is also on a very low-dose of metoprolol  tartrate 12.5 mg twice daily and is on chronic anticoagulation as well as antiplatelet therapy with clopidogrel .  He had a low risk nuclear study with normal myocardial perfusion and EF 69% in May 2024.  He had an echo in June 2024 that showed mild left ventricular hypertrophy, normal LV function, no significant valvular abnormalities.  Diastolic function was described as indeterminate.  Metabolic control is good with a very recent LDL cholesterol of only 46 and hemoglobin A1c of 6.0%.  He  has a chronically low HDL of 36 but this is actually a little better than in years past.  Triglycerides are normal and he has normal renal function.    Studies Reviewed EKG Interpretation Date/Time:  Wednesday June 02 2024 08:25:42 EDT Ventricular Rate:  59 PR Interval:  170 QRS Duration:  92 QT Interval:  428 QTC Calculation: 423 R Axis:   -35  Text Interpretation: Sinus bradycardia with Premature supraventricular complexes Left axis deviation When compared with ECG of 04-Dec-2023 13:41, QT has shortened Confirmed by Kanaya Gunnarson [phone]) on 06/02/2024 8:40:16 AM     Risk Assessment/Calculations      CHA2DS2-VASc Score = 5  The patient's score is based upon: CHF History: 1 HTN History: 1 Diabetes History: 1 Stroke History: 0 Vascular Disease History: 1 Age Score: 1 Gender Score: 0       ASSESSMENT AND PLAN: Paroxysmal Atrial Fibrillation (ICD10:  I48.0) The patient's CHA2DS2-VASc score is 5, indicating a 7.2% annual risk of stroke.    Secondary Hypercoagulable State (ICD10:  D68.69) The patient is at significant risk for stroke/thromboembolism based upon his CHA2DS2-VASc Score of 5.  Continue Rivaroxaban  (Xarelto ).    Physical Exam VS:  BP 118/68 (BP Location: Right Arm, Patient Position: Sitting, Cuff Size: Normal)   Pulse (!) 59   Ht 6' 1 (1.854 m)   Wt 240 lb (108.9 kg)   SpO2 97%   BMI 31.66 kg/m        Wt Readings from Last 3 Encounters:  06/02/24 240  lb (108.9 kg)  12/16/23 235 lb 3.2 oz (106.7 kg)  12/05/23 229 lb 11.5 oz (104.2 kg)    GEN: Well nourished, well developed in no acute distress NECK: No JVD; No carotid bruits CARDIAC: RRR, no murmurs, rubs, gallops RESPIRATORY:  Clear to auscultation without rales, wheezing or rhonchi  ABDOMEN: Soft, non-tender, non-distended EXTREMITIES:  No edema; No deformity   ASSESSMENT AND PLAN  CAD: Asymptomatic despite being physically active.  On high-dose statin with effective LDL reduction.  Currently on  clopidogrel , but also requires full anticoagulation for atrial fibrillation.   CHF: Clinically euvolemic and NYHA functional class I.  I do not think he is describing true bendopnea.  He has had problems with severe hypokalemia and volume depletion when taking scheduled loop diuretics.  Can consider addition of an SGLT2 inhibitor. A-fib: No recent symptomatic events Anticoagulation: No serious bleeding complications.  It has been 8 years since his coronary stenting.  If he has any issues of bleeding we can stop his clopidogrel , but he would like to continue his medications unchanged. DM: Hemoglobin A1c minimally elevated at 6.0%, not on any medications. HTN: Blood pressure is very well-controlled on virtually no antihypertensive medications.  He has mild orthostatic dizziness.  If this worsens we will reduce the metoprolol  by switching to metoprolol  succinate 12.5 mg once daily.       Dispo:  Patient Instructions  Medication Instructions:  No changes *If you need a refill on your cardiac medications before your next appointment, please call your pharmacy*  Lab Work: None ordered If you have labs (blood work) drawn today and your tests are completely normal, you will receive your results only by: MyChart Message (if you have MyChart) OR A paper copy in the mail If you have any lab test that is abnormal or we need to change your treatment, we will call you to review the results.  Testing/Procedures: None ordered  Follow-Up: At [facility], you and your health needs are our priority.  As part of our continuing mission to provide you with exceptional heart care, our providers are all part of one team.  This team includes your primary Cardiologist (physician) and Advanced Practice Providers or APPs (Physician Assistants and Nurse Practitioners) who all work together to provide you with the care you need, when you need it.  Your next appointment:   1 year(s)  Provider:   Dr  Francyne  We recommend signing up for the patient portal called MyChart.  Sign up information is provided on this After Visit Summary.  MyChart is used to connect with patients for Virtual Visits (Telemedicine).  Patients are able to view lab/test results, encounter notes, upcoming appointments, etc.  Non-urgent messages can be sent to your provider as well.   To learn more about what you can do with MyChart, go to [email or web address].          Signed, Jerel Francyne, MD

## 2024-06-08 ENCOUNTER — Other Ambulatory Visit: Payer: Self-pay | Admitting: Cardiovascular Disease

## 2024-06-30 ENCOUNTER — Encounter: Payer: Self-pay | Admitting: Cardiovascular Disease

## 2024-06-30 DIAGNOSIS — R0602 Shortness of breath: Secondary | ICD-10-CM

## 2024-07-03 NOTE — Telephone Encounter (Signed)
 Can we please repeat an echo for shortness of breath

## 2024-07-20 ENCOUNTER — Ambulatory Visit: Payer: Self-pay | Admitting: Cardiovascular Disease

## 2024-07-20 ENCOUNTER — Ambulatory Visit (HOSPITAL_COMMUNITY)
Admission: RE | Admit: 2024-07-20 | Discharge: 2024-07-20 | Disposition: A | Discharge status: Home / Self Care | Source: Ambulatory Visit | Attending: Cardiovascular Disease | Admitting: Cardiovascular Disease

## 2024-07-20 DIAGNOSIS — R0602 Shortness of breath: Secondary | ICD-10-CM | POA: Insufficient documentation

## 2024-07-20 DIAGNOSIS — R0609 Other forms of dyspnea: Secondary | ICD-10-CM

## 2024-07-20 LAB — ECHOCARDIOGRAM COMPLETE
Area-P 1/2: 2.99 cm2
S' Lateral: 2.8 cm

## 2024-07-23 ENCOUNTER — Encounter: Payer: Self-pay | Admitting: Cardiovascular Disease

## 2024-07-23 NOTE — Telephone Encounter (Signed)
 Error

## 2024-07-26 NOTE — Telephone Encounter (Signed)
 Lets go ahead and schedule him for a PET scan.  Tell him this is more accurate than the SPECT scan that he had a year ago, but may take a little longer to schedule.  A cardiac CT would not be useful since he has a previous coronary stent. I would also want him to have pulmonary function tests in the meantime, please.  Reason: exertional dyspnea

## 2024-07-26 NOTE — Telephone Encounter (Signed)
 Called and made patient aware that per Dr. Francyne ordered placed for PET Scan and Pulmonary function test. Patient verbalized an understanding.

## 2024-08-01 ENCOUNTER — Emergency Department (HOSPITAL_COMMUNITY)

## 2024-08-01 ENCOUNTER — Encounter (HOSPITAL_COMMUNITY): Payer: Self-pay

## 2024-08-01 ENCOUNTER — Observation Stay (HOSPITAL_COMMUNITY)
Admission: EM | Admit: 2024-08-01 | Discharge: 2024-08-03 | Disposition: A | Discharge status: Home / Self Care | Attending: Internal Medicine | Admitting: Internal Medicine

## 2024-08-01 ENCOUNTER — Other Ambulatory Visit: Payer: Self-pay

## 2024-08-01 DIAGNOSIS — E785 Hyperlipidemia, unspecified: Secondary | ICD-10-CM | POA: Diagnosis present

## 2024-08-01 DIAGNOSIS — F419 Anxiety disorder, unspecified: Secondary | ICD-10-CM | POA: Diagnosis not present

## 2024-08-01 DIAGNOSIS — I480 Paroxysmal atrial fibrillation: Secondary | ICD-10-CM | POA: Diagnosis not present

## 2024-08-01 DIAGNOSIS — K317 Polyp of stomach and duodenum: Secondary | ICD-10-CM | POA: Insufficient documentation

## 2024-08-01 DIAGNOSIS — Z79899 Other long term (current) drug therapy: Secondary | ICD-10-CM | POA: Insufficient documentation

## 2024-08-01 DIAGNOSIS — K648 Other hemorrhoids: Secondary | ICD-10-CM | POA: Diagnosis present

## 2024-08-01 DIAGNOSIS — K31819 Angiodysplasia of stomach and duodenum without bleeding: Secondary | ICD-10-CM

## 2024-08-01 DIAGNOSIS — D62 Acute posthemorrhagic anemia: Secondary | ICD-10-CM | POA: Insufficient documentation

## 2024-08-01 DIAGNOSIS — K2950 Unspecified chronic gastritis without bleeding: Secondary | ICD-10-CM | POA: Diagnosis not present

## 2024-08-01 DIAGNOSIS — I10 Essential (primary) hypertension: Secondary | ICD-10-CM | POA: Diagnosis present

## 2024-08-01 DIAGNOSIS — G4733 Obstructive sleep apnea (adult) (pediatric): Secondary | ICD-10-CM | POA: Diagnosis not present

## 2024-08-01 DIAGNOSIS — I2510 Atherosclerotic heart disease of native coronary artery without angina pectoris: Secondary | ICD-10-CM | POA: Diagnosis not present

## 2024-08-01 DIAGNOSIS — I5032 Chronic diastolic (congestive) heart failure: Secondary | ICD-10-CM | POA: Diagnosis present

## 2024-08-01 DIAGNOSIS — Z87891 Personal history of nicotine dependence: Secondary | ICD-10-CM | POA: Insufficient documentation

## 2024-08-01 DIAGNOSIS — K922 Gastrointestinal hemorrhage, unspecified: Secondary | ICD-10-CM | POA: Diagnosis not present

## 2024-08-01 DIAGNOSIS — D649 Anemia, unspecified: Secondary | ICD-10-CM | POA: Diagnosis present

## 2024-08-01 DIAGNOSIS — K219 Gastro-esophageal reflux disease without esophagitis: Secondary | ICD-10-CM | POA: Diagnosis present

## 2024-08-01 DIAGNOSIS — E782 Mixed hyperlipidemia: Secondary | ICD-10-CM

## 2024-08-01 DIAGNOSIS — D6489 Other specified anemias: Secondary | ICD-10-CM | POA: Insufficient documentation

## 2024-08-01 DIAGNOSIS — D509 Iron deficiency anemia, unspecified: Secondary | ICD-10-CM | POA: Insufficient documentation

## 2024-08-01 DIAGNOSIS — K921 Melena: Secondary | ICD-10-CM | POA: Diagnosis not present

## 2024-08-01 DIAGNOSIS — E119 Type 2 diabetes mellitus without complications: Secondary | ICD-10-CM | POA: Insufficient documentation

## 2024-08-01 DIAGNOSIS — Z8673 Personal history of transient ischemic attack (TIA), and cerebral infarction without residual deficits: Secondary | ICD-10-CM | POA: Diagnosis not present

## 2024-08-01 DIAGNOSIS — D500 Iron deficiency anemia secondary to blood loss (chronic): Principal | ICD-10-CM

## 2024-08-01 DIAGNOSIS — K5730 Diverticulosis of large intestine without perforation or abscess without bleeding: Secondary | ICD-10-CM | POA: Insufficient documentation

## 2024-08-01 DIAGNOSIS — I110 Hypertensive heart disease with heart failure: Secondary | ICD-10-CM | POA: Diagnosis not present

## 2024-08-01 DIAGNOSIS — E1159 Type 2 diabetes mellitus with other circulatory complications: Secondary | ICD-10-CM

## 2024-08-01 DIAGNOSIS — N179 Acute kidney failure, unspecified: Secondary | ICD-10-CM | POA: Insufficient documentation

## 2024-08-01 DIAGNOSIS — E669 Obesity, unspecified: Secondary | ICD-10-CM | POA: Diagnosis present

## 2024-08-01 DIAGNOSIS — G4730 Sleep apnea, unspecified: Secondary | ICD-10-CM | POA: Diagnosis present

## 2024-08-01 LAB — URINALYSIS, ROUTINE W REFLEX MICROSCOPIC
Bilirubin Urine: NEGATIVE
Glucose, UA: NEGATIVE mg/dL
Hgb urine dipstick: NEGATIVE
Ketones, ur: NEGATIVE mg/dL
Leukocytes,Ua: NEGATIVE
Nitrite: NEGATIVE
Protein, ur: NEGATIVE mg/dL
Specific Gravity, Urine: 1.011 (ref 1.005–1.030)
pH: 6 (ref 5.0–8.0)

## 2024-08-01 LAB — CBC
HCT: 24.7 % — ABNORMAL LOW (ref 39.0–52.0)
HCT: 23.3 % — ABNORMAL LOW (ref 39.0–52.0)
Hemoglobin: 7 g/dL — ABNORMAL LOW (ref 13.0–17.0)
Hemoglobin: 6.8 g/dL — CL (ref 13.0–17.0)
MCH: 20.8 pg — ABNORMAL LOW (ref 26.0–34.0)
MCH: 21.3 pg — ABNORMAL LOW (ref 26.0–34.0)
MCHC: 28.3 g/dL — ABNORMAL LOW (ref 30.0–36.0)
MCHC: 29.2 g/dL — ABNORMAL LOW (ref 30.0–36.0)
MCV: 73.3 fL — ABNORMAL LOW (ref 80.0–100.0)
MCV: 73 fL — ABNORMAL LOW (ref 80.0–100.0)
Platelets: 435 K/uL — ABNORMAL HIGH (ref 150–400)
Platelets: 317 K/uL (ref 150–400)
RBC: 3.37 MIL/uL — ABNORMAL LOW (ref 4.22–5.81)
RBC: 3.19 MIL/uL — ABNORMAL LOW (ref 4.22–5.81)
RDW: 15.6 % — ABNORMAL HIGH (ref 11.5–15.5)
RDW: 15.6 % — ABNORMAL HIGH (ref 11.5–15.5)
WBC: 7.6 K/uL (ref 4.0–10.5)
WBC: 7.7 K/uL (ref 4.0–10.5)
nRBC: 0 % (ref 0.0–0.2)
nRBC: 0 % (ref 0.0–0.2)

## 2024-08-01 LAB — BASIC METABOLIC PANEL WITH GFR
Anion gap: 11 (ref 5–15)
BUN: 23 mg/dL (ref 8–23)
CO2: 21 mmol/L — ABNORMAL LOW (ref 22–32)
Calcium: 8.9 mg/dL (ref 8.9–10.3)
Chloride: 106 mmol/L (ref 98–111)
Creatinine, Ser: 1.49 mg/dL — ABNORMAL HIGH (ref 0.61–1.24)
GFR, Estimated: 49 mL/min — ABNORMAL LOW (ref >60)
Glucose, Bld: 144 mg/dL — ABNORMAL HIGH (ref 70–99)
Potassium: 3.8 mmol/L (ref 3.5–5.1)
Sodium: 138 mmol/L (ref 135–145)

## 2024-08-01 LAB — HEPATIC FUNCTION PANEL
ALT: 22 U/L (ref 0–44)
AST: 24 U/L (ref 15–41)
Albumin: 3.4 g/dL — ABNORMAL LOW (ref 3.5–5.0)
Alkaline Phosphatase: 54 U/L (ref 38–126)
Bilirubin, Direct: 0.2 mg/dL (ref 0.0–0.2)
Indirect Bilirubin: 0.9 mg/dL (ref 0.3–0.9)
Total Bilirubin: 1.1 mg/dL (ref 0.0–1.2)
Total Protein: 6.1 g/dL — ABNORMAL LOW (ref 6.5–8.1)

## 2024-08-01 LAB — TROPONIN I (HIGH SENSITIVITY)
Troponin I (High Sensitivity): 7 ng/L (ref <18)
Troponin I (High Sensitivity): 7 ng/L (ref <18)

## 2024-08-01 LAB — PREPARE RBC (CROSSMATCH)

## 2024-08-01 LAB — ABO/RH: ABO/RH(D): A POS

## 2024-08-01 LAB — IRON AND TIBC
Iron: 22 ug/dL — ABNORMAL LOW (ref 45–182)
Saturation Ratios: 5 % — ABNORMAL LOW (ref 17.9–39.5)
TIBC: 424 ug/dL (ref 250–450)
UIBC: 402 ug/dL

## 2024-08-01 LAB — BRAIN NATRIURETIC PEPTIDE: B Natriuretic Peptide: 145.9 pg/mL — ABNORMAL HIGH (ref 0.0–100.0)

## 2024-08-01 LAB — FERRITIN: Ferritin: 3 ng/mL — ABNORMAL LOW (ref 24–336)

## 2024-08-01 LAB — POC OCCULT BLOOD, ED: Fecal Occult Bld: POSITIVE — AB

## 2024-08-01 MED ORDER — POLYETHYLENE GLYCOL 3350 17 G PO PACK: 17.0000 g | PACK | Freq: Every day | ORAL | Status: DC | PRN

## 2024-08-01 MED ORDER — ESCITALOPRAM OXALATE 10 MG PO TABS
10.0000 mg | ORAL_TABLET | Freq: Every day | ORAL | Status: DC
  Administered 2024-08-02 – 2024-08-03 (×2): 10 mg via ORAL
  Filled 2024-08-01 (×3): qty 1

## 2024-08-01 MED ORDER — SODIUM CHLORIDE 0.9% FLUSH
3.0000 mL | Freq: Two times a day (BID) | INTRAVENOUS | Status: DC
  Administered 2024-08-01 – 2024-08-03 (×4): 3 mL via INTRAVENOUS

## 2024-08-01 MED ORDER — ACETAMINOPHEN 325 MG PO TABS: 650.0000 mg | ORAL_TABLET | Freq: Four times a day (QID) | ORAL | Status: DC | PRN

## 2024-08-01 MED ORDER — METOPROLOL TARTRATE 12.5 MG HALF TABLET
12.5000 mg | ORAL_TABLET | Freq: Two times a day (BID) | ORAL | Status: DC
  Administered 2024-08-01 – 2024-08-03 (×3): 12.5 mg via ORAL
  Filled 2024-08-01 (×4): qty 1

## 2024-08-01 MED ORDER — ALLOPURINOL 300 MG PO TABS
300.0000 mg | ORAL_TABLET | Freq: Every day | ORAL | Status: DC
  Administered 2024-08-02 – 2024-08-03 (×2): 300 mg via ORAL
  Filled 2024-08-01: qty 3
  Filled 2024-08-01 (×2): qty 1

## 2024-08-01 MED ORDER — PANTOPRAZOLE SODIUM 40 MG PO TBEC
40.0000 mg | DELAYED_RELEASE_TABLET | Freq: Two times a day (BID) | ORAL | Status: DC
  Administered 2024-08-01: 40 mg via ORAL
  Filled 2024-08-01: qty 1

## 2024-08-01 MED ORDER — SPIRONOLACTONE 25 MG PO TABS
25.0000 mg | ORAL_TABLET | Freq: Every day | ORAL | Status: DC
  Filled 2024-08-01: qty 1

## 2024-08-01 MED ORDER — ATORVASTATIN CALCIUM 40 MG PO TABS
40.0000 mg | ORAL_TABLET | Freq: Every day | ORAL | Status: DC
  Administered 2024-08-01 – 2024-08-02 (×2): 40 mg via ORAL
  Filled 2024-08-01 (×2): qty 1

## 2024-08-01 MED ORDER — INSULIN ASPART 100 UNIT/ML IJ SOLN
0.0000 [IU] | Freq: Three times a day (TID) | INTRAMUSCULAR | Status: DC
  Administered 2024-08-02: 3 [IU] via SUBCUTANEOUS
  Administered 2024-08-02: 2 [IU] via SUBCUTANEOUS

## 2024-08-01 MED ORDER — ACETAMINOPHEN 650 MG RE SUPP: 650.0000 mg | Freq: Four times a day (QID) | RECTAL | Status: DC | PRN

## 2024-08-01 MED ORDER — SODIUM CHLORIDE 0.9% IV SOLUTION: Freq: Once | INTRAVENOUS | Status: AC

## 2024-08-01 NOTE — ED Provider Notes (Signed)
 [place] EMERGENCY DEPARTMENT AT [facility] Provider Note   CSN: [id number] Arrival date & time: 08/01/24  1307     Patient presents with: No chief complaint on file.   Matthew Love is a 73 y.o. male.   Pt complains of weakness and shortness of breath. Pt reports symptoms started a month ago but has increased.  Patient denies any fever or chills.  He denies any nausea or vomiting he has not had any coughing.  Patient reports he has seen some dark stools.  Patient has a past medical history of coronary artery disease a CVA congestive heart failure hypertension GERD diabetes hyperlipidemia and atrial fibrillation.    The history is provided by the patient and the spouse.       Prior to Admission medications   Medication Sig Start Date End Date Taking? Authorizing Provider  allopurinol  (ZYLOPRIM ) 300 MG tablet Take 300 mg by mouth daily.    [provider]  atorvastatin  (LIPITOR) 40 MG tablet TAKE ONE TABLET BY MOUTH ONCE DAILY AT  6  PM 03/18/17   Verlin Lonni JONETTA, MD  clopidogrel  (PLAVIX ) 75 MG tablet Take 1 tablet by mouth once daily 01/22/21   Nahser, Aleene PARAS, MD  EPINEPHrine  (EPIPEN ) 0.3 mg/0.3 mL SOAJ injection Inject 0.3 mLs (0.3 mg total) into the muscle once. For signs or symptoms of anaphylaxis. Patient not taking: Reported on 06/02/2024 02/20/14   Horton, Charmaine FALCON, MD  escitalopram  (LEXAPRO ) 10 MG tablet Take 10 mg by mouth daily.  02/11/13   [provider]  metoprolol  tartrate (LOPRESSOR ) 25 MG tablet Take 1/2 (one-half) tablet by mouth twice daily 06/10/24   Croitoru, Mihai, MD  Multiple Vitamin (MULTIVITAMIN) tablet Take 1 tablet by mouth daily.    [provider]  nitroGLYCERIN  (NITROSTAT ) 0.4 MG SL tablet Place 1 tablet (0.4 mg total) under the tongue every 5 (five) minutes as needed for chest pain. Make sure you are sitting when you take this medication. Do not take more than 3 doses in 15 minutes. If you are still having  chest pain after 3 doses, then call 911. 06/02/24   Croitoru, Mihai, MD  OMEGA 3 1200 MG CAPS Take 1,200 mg by mouth daily.    [provider]  pantoprazole  (PROTONIX ) 40 MG tablet Take 40 mg by mouth 2 (two) times daily. 07/17/17   [provider]  rivaroxaban  (XARELTO ) 20 MG TABS tablet Take 1 tablet (20 mg total) by mouth daily with supper. 12/18/23   Nahser, Aleene PARAS, MD  spironolactone  (ALDACTONE ) 25 MG tablet Take 1 tablet (25 mg total) by mouth daily. 12/16/23   Nahser, Aleene PARAS, MD    Allergies: Bee venom, Oxycodone -acetaminophen , and Hydrocodone-acetaminophen     Review of Systems  All other systems reviewed and are negative.   Updated Vital Signs BP (!) 123/59 (BP Location: Right Arm)   Pulse 67   Temp 97.7 F (36.5 C) (Oral)   Resp (!) 24   Ht 6' 2 (1.88 m)   Wt 104.3 kg   SpO2 100%   BMI 29.53 kg/m   Physical Exam Vitals and nursing note reviewed.  Constitutional:      Appearance: He is well-developed.  HENT:     Head: Normocephalic.     Mouth/Throat:     Mouth: Mucous membranes are moist.  Cardiovascular:     Rate and Rhythm: Normal rate.  Pulmonary:     Effort: Pulmonary effort is normal.  Abdominal:  General: There is no distension.  Genitourinary:    Comments: Black tarry stool Musculoskeletal:        General: Normal range of motion.     Cervical back: Normal range of motion.  Skin:    General: Skin is warm.  Neurological:     General: No focal deficit present.     Mental Status: He is alert and oriented to person, place, and time.  Psychiatric:        Mood and Affect: Mood normal.     (all labs ordered are listed, but only abnormal results are displayed) Labs Reviewed  BASIC METABOLIC PANEL WITH GFR - Abnormal; Notable for the following components:      Result Value   CO2 21 (*)    Glucose, Bld 144 (*)    Creatinine, Ser 1.49 (*)    GFR, Estimated 49 (*)    All other components within normal limits  CBC - Abnormal;  Notable for the following components:   RBC 3.37 (*)    Hemoglobin 7.0 (*)    HCT 24.7 (*)    MCV 73.3 (*)    MCH 20.8 (*)    MCHC 28.3 (*)    RDW 15.6 (*)    Platelets 435 (*)    All other components within normal limits  BRAIN NATRIURETIC PEPTIDE - Abnormal; Notable for the following components:   B Natriuretic Peptide 145.9 (*)    All other components within normal limits  URINALYSIS, ROUTINE W REFLEX MICROSCOPIC  HEPATIC FUNCTION PANEL  POC OCCULT BLOOD, ED  TYPE AND SCREEN  ABO/RH  TROPONIN I (HIGH SENSITIVITY)  TROPONIN I (HIGH SENSITIVITY)    EKG: EKG Interpretation Date/Time:  Sunday August 01 2024 13:38:21 EDT Ventricular Rate:  73 PR Interval:    QRS Duration:  98 QT Interval:  428 QTC Calculation: 472 R Axis:   -20  Text Interpretation: Atrial fibrillation Borderline left axis deviation Hx of Atrial fib in the past Confirmed by Zackowski, Scott [phone]) on 08/01/2024 1:42:34 PM  Radiology: [facility] Chest 2 View Result Date: 08/01/2024 EXAM: 2 VIEW(S) XRAY OF THE CHEST 08/01/2024 02:11:00 PM COMPARISON: 12/04/23 chest radiograph CLINICAL HISTORY: shob. Ordered for shortness of breath. Only on room air currently ; Per triage notes: Patient here with complaints of shortness of breath for over a month and it has been getting worse. Denies chest pain. Patient has a history of CHF. FINDINGS: LUNGS AND PLEURA: No focal pulmonary opacity. No pulmonary edema. No pleural effusion. No pneumothorax. HEART AND MEDIASTINUM: No acute abnormality of the cardiac and mediastinal silhouettes. BONES AND SOFT TISSUES: Multilevel degenerative changes of thoracic spine. No acute osseous abnormality. IMPRESSION: 1. No acute process. Electronically signed by: Selinda Blue MD 08/01/2024 02:25 PM EDT RP Workstation: [id number]     Procedures   Medications Ordered in the ED - No data to display                                  Medical Decision Making Patient complains of increasing weakness and  shortness of breath.  Patient reports he has a past medical history of congestive heart failure.  He does not have any swelling in his extremities  Amount and/or Complexity of Data Reviewed Independent Historian: spouse    Details: Patient is here with his wife who is supportive External Data Reviewed: notes.    Details: Audiology notes reviewed Labs: ordered.    Details:  Labs ordered reviewed and interpreted hemoglobin is 7.  Creatinine is 1.49 GFR is 40.  BNP is 145.  Hemoccult stool is positive to give Radiology: ordered.    Details: Chest x-ray shows no acute findings ECG/medicine tests: ordered and independent interpretation performed. Decision-making details documented in ED Course.    Details: EKG shows atrial fibrillation Discussion of management or test interpretation with external provider(s): Hospitalist consulted for admission  Risk Prescription drug management.        Final diagnoses:  Iron  deficiency anemia due to chronic blood loss  Gastrointestinal hemorrhage, unspecified gastrointestinal hemorrhage type    ED Discharge Orders     None          Flint Sonny MARLA DEVONNA 08/01/24 1619    Geraldene Hamilton, MD 08/01/24 2124

## 2024-08-01 NOTE — ED Triage Notes (Addendum)
 Patient here with complaints of shortness of breath for over a month and it has been getting worse. Denies chest pain. Patient has a history of CHF.

## 2024-08-01 NOTE — Progress Notes (Signed)
 Reason for Consult: Melena and anemia Referring Physician: [facility]  Kayode D Endsley HPI: This is a 73 year old male with a PMH of CAD, GERD, CVA, and sleep apnea admitted for complaints of SOB and weakness.  He believes his symptoms started one month ago and it was progressive.  There is a history of heart disease and he followed up with Cardiology, but the work up was negative.  Because he felt worse with DOE and fatigue, he presented to the ER.  In the ER he was noted to have an HGB of 7.0 g/dL as well as heme positive stool.  Blood work on 12/06/2023 showed that his HGB was 11.1 g/dL.  The last colonoscopy with Dr. Abran was on 08/31/2020 and two SSAs were removed.  He has a history of GERD and his PCP treats him with pantoprazole  40 mg BID.  The patient denies any issues with dysphagia, nausea, vomiting, or hematemesis.  For the past month he did notice having some intermittent melena.  Past Medical History:  Diagnosis Date   Anxiety    Arthritis    Coronary artery disease    a. NST 08/2016: high-risk w/ inferior and lateral wall ischemia  b. cath 09/2016: 80% mid-LAD and 90% distal LAD stenosis. Placement of 2 DES. Mild nonobstructive dz along RCA and LCx.   Diabetes mellitus    A1C 6 weeks ago as of 09/08/14 was 6.2. Not on oral meds.   GERD (gastroesophageal reflux disease)    H/O cardiovascular stress test    ETT-Myoview  11/11: Inferior fixed defect likely due to diaphragmatic attenuation with normal wall motion, EF 69%   H/O echocardiogram    Echo 11/11: EF 60-65%, grade 1 diastolic dysfunction, mild LAE   H/O hiatal hernia    Hyperlipidemia    Hypertension    Mini stroke    Mini stroke    Obesity    Pneumonia    hx   Sleep apnea    cpap 1 yr   Stroke [facility]) 2010    Past Surgical History:  Procedure Laterality Date   BUNIONECTOMY     CARDIAC CATHETERIZATION N/A 09/06/2016   Procedure: Left Heart Cath and Coronary Angiography;  Surgeon: Lonni JONETTA Cash, MD;   Location: [facility] INVASIVE CV LAB;  Service: Cardiovascular;  Laterality: N/A;   CARDIAC CATHETERIZATION N/A 09/06/2016   Procedure: Coronary Stent Intervention;  Surgeon: Lonni JONETTA Cash, MD;  Location: [facility] INVASIVE CV LAB;  Service: Cardiovascular;  Laterality: N/A;   COLONOSCOPY  2011   CORONARY STENT PLACEMENT  09/06/2016   Severe stenosis mid LAD, s/p successful PTCA/DES x 1   FOOT SURGERY Left    bunion   Hydrocell Removal     KNEE SURGERY Left    arthroscopy   NASAL SEPTOPLASTY W/ TURBINOPLASTY N/A 09/08/2014   Procedure: NASAL SEPTOPLASTY WITH TURBINATE REDUCTION;  Surgeon: Marlyce Finer, MD;  Location: [facility] OR;  Service: ENT;  Laterality: N/A;   TONSILLECTOMY      Family History  Problem Relation Age of Onset   Prostate cancer Father    Colon cancer Father    Heart attack Mother    Heart disease Other        paternal grandparents   Alzheimer's disease Maternal Grandmother    Stomach cancer Neg Hx     Social History:  reports that he quit smoking about 39 years ago. His smoking use included cigarettes. He started smoking about 59 years ago. He has a 70 pack-year smoking  history. He has never used smokeless tobacco. He reports that he does not drink alcohol  and does not use drugs.  Allergies:  Allergies  Allergen Reactions   Bee Venom Anaphylaxis, Itching and Swelling    Other Reaction(s): Not available   Oxycodone -Acetaminophen      Unknown reaction   Hydrocodone-Acetaminophen  Rash    Medications: Scheduled:  sodium chloride    Intravenous Once   allopurinol   300 mg Oral Daily   atorvastatin   40 mg Oral q1800   escitalopram   10 mg Oral Daily   metoprolol  tartrate  12.5 mg Oral BID   pantoprazole   40 mg Oral BID   sodium chloride  flush  3 mL Intravenous Q12H   spironolactone   25 mg Oral Daily   Continuous:  Results for orders placed or performed during the hospital encounter of 08/01/24 (from the past 24 hours)  Basic metabolic panel     Status: Abnormal    Collection Time: 08/01/24  1:17 PM  Result Value Ref Range   Sodium 138 135 - 145 mmol/L   Potassium 3.8 3.5 - 5.1 mmol/L   Chloride 106 98 - 111 mmol/L   CO2 21 (L) 22 - 32 mmol/L   Glucose, Bld 144 (H) 70 - 99 mg/dL   BUN 23 8 - 23 mg/dL   Creatinine, Ser 8.50 (H) 0.61 - 1.24 mg/dL   Calcium  8.9 8.9 - 10.3 mg/dL   GFR, Estimated 49 (L) >60 mL/min   Anion gap 11 5 - 15  CBC     Status: Abnormal   Collection Time: 08/01/24  1:17 PM  Result Value Ref Range   WBC 7.6 4.0 - 10.5 K/uL   RBC 3.37 (L) 4.22 - 5.81 MIL/uL   Hemoglobin 7.0 (L) 13.0 - 17.0 g/dL   HCT 75.2 (L) 60.9 - 47.9 %   MCV 73.3 (L) 80.0 - 100.0 fL   MCH 20.8 (L) 26.0 - 34.0 pg   MCHC 28.3 (L) 30.0 - 36.0 g/dL   RDW 84.3 (H) 88.4 - 84.4 %   Platelets 435 (H) 150 - 400 K/uL   nRBC 0.0 0.0 - 0.2 %  BNP (Order if Patient has history of Heart Failure)     Status: Abnormal   Collection Time: 08/01/24  1:17 PM  Result Value Ref Range   B Natriuretic Peptide 145.9 (H) 0.0 - 100.0 pg/mL  Troponin I (High Sensitivity)     Status: None   Collection Time: 08/01/24  1:17 PM  Result Value Ref Range   Troponin I (High Sensitivity) 7 <18 ng/L  ABO/Rh     Status: None   Collection Time: 08/01/24  1:22 PM  Result Value Ref Range   ABO/RH(D)      A POS Performed at [facility], [address], [place], [place] [address]   Type and screen [facility]     Status: None (Preliminary result)   Collection Time: 08/01/24  2:23 PM  Result Value Ref Range   ABO/RH(D) A POS    Antibody Screen NEG    Sample Expiration 08/04/2024,2359    Unit Number [id number]    Blood Component Type RED CELLS,LR    Unit division 00    Status of Unit ISSUED    Transfusion Status OK TO TRANSFUSE    Crossmatch Result      Compatible Performed at [facility], [address], [place], [place] [address]    Unit Number [id number]    Blood Component Type  RED CELLS,LR    Unit division 00    Status of Unit ALLOCATED     Transfusion Status OK TO TRANSFUSE    Crossmatch Result Compatible   Hepatic function panel     Status: Abnormal   Collection Time: 08/01/24  3:18 PM  Result Value Ref Range   Total Protein 6.1 (L) 6.5 - 8.1 g/dL   Albumin 3.4 (L) 3.5 - 5.0 g/dL   AST 24 15 - 41 U/L   ALT 22 0 - 44 U/L   Alkaline Phosphatase 54 38 - 126 U/L   Total Bilirubin 1.1 0.0 - 1.2 mg/dL   Bilirubin, Direct 0.2 0.0 - 0.2 mg/dL   Indirect Bilirubin 0.9 0.3 - 0.9 mg/dL  Troponin I (High Sensitivity)     Status: None   Collection Time: 08/01/24  3:18 PM  Result Value Ref Range   Troponin I (High Sensitivity) 7 <18 ng/L  Prepare RBC (crossmatch)     Status: None   Collection Time: 08/01/24  3:55 PM  Result Value Ref Range   Order Confirmation      ORDER PROCESSED BY BLOOD BANK Performed at [facility], [address], [place], [place] [address]   Urinalysis, Routine w reflex microscopic -Urine, Clean Catch     Status: None   Collection Time: 08/01/24  3:56 PM  Result Value Ref Range   Color, Urine YELLOW YELLOW   APPearance CLEAR CLEAR   Specific Gravity, Urine 1.011 1.005 - 1.030   pH 6.0 5.0 - 8.0   Glucose, UA NEGATIVE NEGATIVE mg/dL   Hgb urine dipstick NEGATIVE NEGATIVE   Bilirubin Urine NEGATIVE NEGATIVE   Ketones, ur NEGATIVE NEGATIVE mg/dL   Protein, ur NEGATIVE NEGATIVE mg/dL   Nitrite NEGATIVE NEGATIVE   Leukocytes,Ua NEGATIVE NEGATIVE  POC occult blood, ED     Status: Abnormal   Collection Time: 08/01/24  4:44 PM  Result Value Ref Range   Fecal Occult Bld POSITIVE (A) NEGATIVE     DG Chest 2 View Result Date: 08/01/2024 EXAM: 2 VIEW(S) XRAY OF THE CHEST 08/01/2024 02:11:00 PM COMPARISON: 12/04/23 chest radiograph CLINICAL HISTORY: shob. Ordered for shortness of breath. Only on room air currently ; Per triage notes: Patient here with complaints of shortness of breath for over a month and it has been getting worse. Denies chest pain. Patient has a history of CHF. FINDINGS: LUNGS AND  PLEURA: No focal pulmonary opacity. No pulmonary edema. No pleural effusion. No pneumothorax. HEART AND MEDIASTINUM: No acute abnormality of the cardiac and mediastinal silhouettes. BONES AND SOFT TISSUES: Multilevel degenerative changes of thoracic spine. No acute osseous abnormality. IMPRESSION: 1. No acute process. Electronically signed by: Selinda Blue MD 08/01/2024 02:25 PM EDT RP Workstation: [id number]    ROS:  As stated above in the HPI otherwise negative.  Blood pressure (!) 138/55, pulse 76, temperature 98 F (36.7 C), temperature source Oral, resp. rate 13, height 6' 2 (1.88 m), weight 104.3 kg, SpO2 100%.    PE: Gen: NAD, Alert and Oriented HEENT:  [place]/AT, EOMI Neck: Supple, no LAD Lungs: CTA Bilaterally CV: RRR without M/G/R ABD: Soft, NTND, +BS Ext: No C/C/E  Assessment/Plan: 1) Melena. 2) Heme positive stool. 3) Anemia.   Further evaluation with an EGD is required.  Unclear if he truly has a peptic source for his bleeding as he takes pantoprazole .  It is possible that he is a primary failure of the PPI.  Plan: 1) EGD tomorrow.  Elyssia Strausser D 08/01/2024, 4:59  PM

## 2024-08-01 NOTE — ED Notes (Signed)
 Patient transported to X-ray

## 2024-08-01 NOTE — H&P (Signed)
 History and Physical   Matthew Love FMW:[id number] DOB: 12/03/1950 DOA: 08/01/2024  PCP: Shepard Ade, MD   Patient coming from: Home  Chief Complaint: Weakness, shortness of breath  HPI: Matthew Love is a 74 y.o. male with medical history significant of hypertension, hyperlipidemia, GERD, CVA, CAD, diabetes, atrial fibrillation, chronic diastolic CHF, obesity, OSA, anxiety presenting with worsening weakness and shortness of breath.  Patient reports gradually worsening weakness and shortness of breath for the past month.  Has had some dark and tarry stools.  Denies fevers, chills, chest pain, abdominal pain, constipation, diarrhea, nausea, vomiting.  ED Course: Vital signs in the ED notable for blood pressure in the 120s systolic, heart rate in the 50s-60s.  Lab workup included BMP with bicarb 21, creatinine 1.49 increased from baseline 1.1, glucose 144.  LFTs pending.  CBC with hemoglobin 7.0 down from 11 7 months ago, platelets 435.  BNP 145.  Troponin negative with repeat pending.  FOBT positive.  Urinalysis pending.  Chest x-ray showed no acute abnormality.  Patient ordered 2 units of PRBCs in the ED.  Review of Systems: As per HPI otherwise all other systems reviewed and are negative.  Past Medical History:  Diagnosis Date   Anxiety    Arthritis    Coronary artery disease    a. NST 08/2016: high-risk w/ inferior and lateral wall ischemia  b. cath 09/2016: 80% mid-LAD and 90% distal LAD stenosis. Placement of 2 DES. Mild nonobstructive dz along RCA and LCx.   Diabetes mellitus    A1C 6 weeks ago as of 09/08/14 was 6.2. Not on oral meds.   GERD (gastroesophageal reflux disease)    H/O cardiovascular stress test    ETT-Myoview  11/11: Inferior fixed defect likely due to diaphragmatic attenuation with normal wall motion, EF 69%   H/O echocardiogram    Echo 11/11: EF 60-65%, grade 1 diastolic dysfunction, mild LAE   H/O hiatal hernia    Hyperlipidemia     Hypertension    Mini stroke    Mini stroke    Obesity    Pneumonia    hx   Sleep apnea    cpap 1 yr   Stroke [facility]) 2010    Past Surgical History:  Procedure Laterality Date   BUNIONECTOMY     CARDIAC CATHETERIZATION N/A 09/06/2016   Procedure: Left Heart Cath and Coronary Angiography;  Surgeon: Lonni JONETTA Cash, MD;  Location: [facility] INVASIVE CV LAB;  Service: Cardiovascular;  Laterality: N/A;   CARDIAC CATHETERIZATION N/A 09/06/2016   Procedure: Coronary Stent Intervention;  Surgeon: Lonni JONETTA Cash, MD;  Location: [facility] INVASIVE CV LAB;  Service: Cardiovascular;  Laterality: N/A;   COLONOSCOPY  2011   CORONARY STENT PLACEMENT  09/06/2016   Severe stenosis mid LAD, s/p successful PTCA/DES x 1   FOOT SURGERY Left    bunion   Hydrocell Removal     KNEE SURGERY Left    arthroscopy   NASAL SEPTOPLASTY W/ TURBINOPLASTY N/A 09/08/2014   Procedure: NASAL SEPTOPLASTY WITH TURBINATE REDUCTION;  Surgeon: Marlyce Finer, MD;  Location: [facility] OR;  Service: ENT;  Laterality: N/A;   TONSILLECTOMY      Social History  reports that he quit smoking about 39 years ago. His smoking use included cigarettes. He started smoking about 59 years ago. He has a 70 pack-year smoking history. He has never used smokeless tobacco. He reports that he does not drink alcohol  and does not use drugs.  Allergies  Allergen Reactions   Bee Venom  Anaphylaxis, Itching and Swelling    Other Reaction(s): Not available   Oxycodone -Acetaminophen      Unknown reaction   Hydrocodone-Acetaminophen  Rash    Family History  Problem Relation Age of Onset   Prostate cancer Father    Colon cancer Father    Heart attack Mother    Heart disease Other        paternal grandparents   Alzheimer's disease Maternal Grandmother    Stomach cancer Neg Hx   Reviewed on admission  Prior to Admission medications   Medication Sig Start Date End Date Taking? Authorizing Provider  allopurinol  (ZYLOPRIM ) 300 MG tablet Take 300 mg by  mouth daily.    [provider]  atorvastatin  (LIPITOR) 40 MG tablet TAKE ONE TABLET BY MOUTH ONCE DAILY AT  6  PM 03/18/17   Verlin Lonni BIRCH, MD  clopidogrel  (PLAVIX ) 75 MG tablet Take 1 tablet by mouth once daily 01/22/21   Nahser, Aleene PARAS, MD  EPINEPHrine  (EPIPEN ) 0.3 mg/0.3 mL SOAJ injection Inject 0.3 mLs (0.3 mg total) into the muscle once. For signs or symptoms of anaphylaxis. Patient not taking: Reported on 06/02/2024 02/20/14   Horton, Charmaine FALCON, MD  escitalopram  (LEXAPRO ) 10 MG tablet Take 10 mg by mouth daily.  02/11/13   [provider]  metoprolol  tartrate (LOPRESSOR ) 25 MG tablet Take 1/2 (one-half) tablet by mouth twice daily 06/10/24   Croitoru, Mihai, MD  Multiple Vitamin (MULTIVITAMIN) tablet Take 1 tablet by mouth daily.    [provider]  nitroGLYCERIN  (NITROSTAT ) 0.4 MG SL tablet Place 1 tablet (0.4 mg total) under the tongue every 5 (five) minutes as needed for chest pain. Make sure you are sitting when you take this medication. Do not take more than 3 doses in 15 minutes. If you are still having chest pain after 3 doses, then call 911. 06/02/24   Croitoru, Mihai, MD  OMEGA 3 1200 MG CAPS Take 1,200 mg by mouth daily.    [provider]  pantoprazole  (PROTONIX ) 40 MG tablet Take 40 mg by mouth 2 (two) times daily. 07/17/17   [provider]  rivaroxaban  (XARELTO ) 20 MG TABS tablet Take 1 tablet (20 mg total) by mouth daily with supper. 12/18/23   Nahser, Aleene PARAS, MD  spironolactone  (ALDACTONE ) 25 MG tablet Take 1 tablet (25 mg total) by mouth daily. 12/16/23   Nahser, Aleene PARAS, MD    Physical Exam: Vitals:   08/01/24 1515 08/01/24 1530 08/01/24 1545 08/01/24 1600  BP: 124/60 (!) 122/57 102/63 123/62  Pulse: 63 68 63 (!) 58  Resp: 12 18 15 14   Temp:      TempSrc:      SpO2: 100% 100% 100% 100%  Weight:      Height:        Physical Exam Constitutional:      General: He is not in acute distress.    Appearance: Normal  appearance.  HENT:     Head: Normocephalic and atraumatic.     Mouth/Throat:     Mouth: Mucous membranes are moist.     Pharynx: Oropharynx is clear.  Eyes:     Extraocular Movements: Extraocular movements intact.     Pupils: Pupils are equal, round, and reactive to light.  Cardiovascular:     Rate and Rhythm: Normal rate and regular rhythm.     Pulses: Normal pulses.     Heart sounds: Normal heart sounds.  Pulmonary:     Effort: Pulmonary effort is normal. No respiratory distress.  Breath sounds: Normal breath sounds.  Abdominal:     General: Bowel sounds are normal. There is no distension.     Palpations: Abdomen is soft.     Tenderness: There is no abdominal tenderness.  Musculoskeletal:        General: No swelling or deformity.  Skin:    General: Skin is warm and dry.  Neurological:     General: No focal deficit present.     Mental Status: Mental status is at baseline.     Labs on Admission: I have personally reviewed following labs and imaging studies  CBC: Recent Labs  Lab 08/01/24 1317  WBC 7.6  HGB 7.0*  HCT 24.7*  MCV 73.3*  PLT 435*    Basic Metabolic Panel: Recent Labs  Lab 08/01/24 1317  NA 138  K 3.8  CL 106  CO2 21*  GLUCOSE 144*  BUN 23  CREATININE 1.49*  CALCIUM  8.9    GFR: Estimated Creatinine Clearance: 56.8 mL/min (A) (by C-G formula based on SCr of 1.49 mg/dL (H)).  Liver Function Tests: No results for input(s): AST, ALT, ALKPHOS, BILITOT, PROT, ALBUMIN in the last 168 hours.  Urine analysis:    Component Value Date/Time   COLORURINE YELLOW 08/01/2024 1556   APPEARANCEUR CLEAR 08/01/2024 1556   LABSPEC 1.011 08/01/2024 1556   PHURINE 6.0 08/01/2024 1556   GLUCOSEU NEGATIVE 08/01/2024 1556   HGBUR NEGATIVE 08/01/2024 1556   BILIRUBINUR NEGATIVE 08/01/2024 1556   KETONESUR NEGATIVE 08/01/2024 1556   PROTEINUR NEGATIVE 08/01/2024 1556   NITRITE NEGATIVE 08/01/2024 1556   LEUKOCYTESUR NEGATIVE 08/01/2024 1556     Radiological Exams on Admission: DG Chest 2 View Result Date: 08/01/2024 EXAM: 2 VIEW(S) XRAY OF THE CHEST 08/01/2024 02:11:00 PM COMPARISON: 12/04/23 chest radiograph CLINICAL HISTORY: shob. Ordered for shortness of breath. Only on room air currently ; Per triage notes: Patient here with complaints of shortness of breath for over a month and it has been getting worse. Denies chest pain. Patient has a history of CHF. FINDINGS: LUNGS AND PLEURA: No focal pulmonary opacity. No pulmonary edema. No pleural effusion. No pneumothorax. HEART AND MEDIASTINUM: No acute abnormality of the cardiac and mediastinal silhouettes. BONES AND SOFT TISSUES: Multilevel degenerative changes of thoracic spine. No acute osseous abnormality. IMPRESSION: 1. No acute process. Electronically signed by: Selinda Blue MD 08/01/2024 02:25 PM EDT RP Workstation: [id number]   EKG: Independently reviewed.  Atrial fibrillation, 73 bpm, nonspecific T wave changes.  Low voltage multiple leads.  Assessment/Plan Active Problems:   Coronary artery disease   Chronic diastolic CHF (congestive heart failure) (HCC)   PAF (paroxysmal atrial fibrillation) (HCC)   Hypertension   GERD (gastroesophageal reflux disease)   Diabetes mellitus (HCC)   OSA (obstructive sleep apnea)   Obesity   Hyperlipidemia   History of CVA (cerebrovascular accident)   Anxiety   GI bleed Symptomatic anemia > A month of ongoing weakness and shortness of breath.  Thought this may be due to his CHF, but on arrival found to have hemoglobin of 7.0 down from 11 seven months ago. > Has had black and tarry stools.  FOBT positive in the ED.  2 units ordered for transfusion. - Monitor on telemetry overnight - Continue with 2 units PRBC transfusion - Consult to GI - Trend CBC - Add-on Iron  studies - Supportive care  AKI > Creatinine elevated 1.49 with baseline 1.1.   > BNP is indeterminate at 145.  Will monitor for volume overloaded in the setting of PRBC  transfusion.   > Hold off on IV fluids given he is receiving blood products in the setting of CHF.  Will monitor renal function for now. - Trend renal function and electrolytes   Chronic diastolic CHF > Last echo was recently done and showed EF 60-65%, indeterminate diastolic function, normal RV function. > Not currently on loop diuretic. - Continue home spironolactone , metoprolol  - Will monitor for volume overload in the setting of 2 units PRBCs being ordered - Trend renal function and electrolytes  Hypertension - Continue metoprolol , spironolactone   Hyperlipidemia - Continue atorvastatin   GERD - Continue PPI  History of CVA - Continue home atorvastatin  - Holding Xarelto  and Plavix  as above  Atrial fibrillation - Continue metoprolol  - Holding Xarelto  as above  CAD - Continue home atorvastatin , metoprolol  - Holding Xarelto  and Plavix  as above  Diabetes - SSI  Anxiety - Continue home Lexapro   Obesity - Noted  OSA - CPAP qhs  DVT prophylaxis: SCDs for now Code Status:   Full Family Communication:  Updated at bedside  Disposition Plan:   Patient is from:  Home  Anticipated DC to:  Home  Anticipated DC date:  1 to 3 days  Anticipated DC barriers: None  Consults called:  Gastroenterology Admission status:  Observation, telemetry  Severity of Illness: The appropriate patient status for this patient is OBSERVATION. Observation status is judged to be reasonable and necessary in order to provide the required intensity of service to ensure the patient's safety. The patient's presenting symptoms, physical exam findings, and initial radiographic and laboratory data in the context of their medical condition is felt to place them at decreased risk for further clinical deterioration. Furthermore, it is anticipated that the patient will be medically stable for discharge from the hospital within 2 midnights of admission.    Marsa KATHEE Scurry MD [facility]  How to  contact the [facility] Attending or Consulting provider 7A - 7P or covering provider during after hours 7P -7A, for this patient?   Check the care team in [facility] and look for a) attending/consulting [facility] provider listed and b) the [facility] team listed Log into [email or web address] and use [facility]'s universal password to access. If you do not have the password, please contact the hospital operator. Locate the [facility] provider you are looking for under [facility] and page to a number that you can be directly reached. If you still have difficulty reaching the provider, please page the [facility] (Director on Call) for the Hospitalists listed on amion for assistance.  08/01/2024, 4:16 PM

## 2024-08-01 NOTE — H&P (View-Only) (Signed)
 Reason for Consult: Melena and anemia Referring Physician: [facility]  Kayode D Endsley HPI: This is a 73 year old male with a PMH of CAD, GERD, CVA, and sleep apnea admitted for complaints of SOB and weakness.  He believes his symptoms started one month ago and it was progressive.  There is a history of heart disease and he followed up with Cardiology, but the work up was negative.  Because he felt worse with DOE and fatigue, he presented to the ER.  In the ER he was noted to have an HGB of 7.0 g/dL as well as heme positive stool.  Blood work on 12/06/2023 showed that his HGB was 11.1 g/dL.  The last colonoscopy with Dr. Abran was on 08/31/2020 and two SSAs were removed.  He has a history of GERD and his PCP treats him with pantoprazole  40 mg BID.  The patient denies any issues with dysphagia, nausea, vomiting, or hematemesis.  For the past month he did notice having some intermittent melena.  Past Medical History:  Diagnosis Date   Anxiety    Arthritis    Coronary artery disease    a. NST 08/2016: high-risk w/ inferior and lateral wall ischemia  b. cath 09/2016: 80% mid-LAD and 90% distal LAD stenosis. Placement of 2 DES. Mild nonobstructive dz along RCA and LCx.   Diabetes mellitus    A1C 6 weeks ago as of 09/08/14 was 6.2. Not on oral meds.   GERD (gastroesophageal reflux disease)    H/O cardiovascular stress test    ETT-Myoview  11/11: Inferior fixed defect likely due to diaphragmatic attenuation with normal wall motion, EF 69%   H/O echocardiogram    Echo 11/11: EF 60-65%, grade 1 diastolic dysfunction, mild LAE   H/O hiatal hernia    Hyperlipidemia    Hypertension    Mini stroke    Mini stroke    Obesity    Pneumonia    hx   Sleep apnea    cpap 1 yr   Stroke [facility]) 2010    Past Surgical History:  Procedure Laterality Date   BUNIONECTOMY     CARDIAC CATHETERIZATION N/A 09/06/2016   Procedure: Left Heart Cath and Coronary Angiography;  Surgeon: Lonni JONETTA Cash, MD;   Location: [facility] INVASIVE CV LAB;  Service: Cardiovascular;  Laterality: N/A;   CARDIAC CATHETERIZATION N/A 09/06/2016   Procedure: Coronary Stent Intervention;  Surgeon: Lonni JONETTA Cash, MD;  Location: [facility] INVASIVE CV LAB;  Service: Cardiovascular;  Laterality: N/A;   COLONOSCOPY  2011   CORONARY STENT PLACEMENT  09/06/2016   Severe stenosis mid LAD, s/p successful PTCA/DES x 1   FOOT SURGERY Left    bunion   Hydrocell Removal     KNEE SURGERY Left    arthroscopy   NASAL SEPTOPLASTY W/ TURBINOPLASTY N/A 09/08/2014   Procedure: NASAL SEPTOPLASTY WITH TURBINATE REDUCTION;  Surgeon: Marlyce Finer, MD;  Location: [facility] OR;  Service: ENT;  Laterality: N/A;   TONSILLECTOMY      Family History  Problem Relation Age of Onset   Prostate cancer Father    Colon cancer Father    Heart attack Mother    Heart disease Other        paternal grandparents   Alzheimer's disease Maternal Grandmother    Stomach cancer Neg Hx     Social History:  reports that he quit smoking about 39 years ago. His smoking use included cigarettes. He started smoking about 59 years ago. He has a 70 pack-year smoking  history. He has never used smokeless tobacco. He reports that he does not drink alcohol  and does not use drugs.  Allergies:  Allergies  Allergen Reactions   Bee Venom Anaphylaxis, Itching and Swelling    Other Reaction(s): Not available   Oxycodone -Acetaminophen      Unknown reaction   Hydrocodone-Acetaminophen  Rash    Medications: Scheduled:  sodium chloride    Intravenous Once   allopurinol   300 mg Oral Daily   atorvastatin   40 mg Oral q1800   escitalopram   10 mg Oral Daily   metoprolol  tartrate  12.5 mg Oral BID   pantoprazole   40 mg Oral BID   sodium chloride  flush  3 mL Intravenous Q12H   spironolactone   25 mg Oral Daily   Continuous:  Results for orders placed or performed during the hospital encounter of 08/01/24 (from the past 24 hours)  Basic metabolic panel     Status: Abnormal    Collection Time: 08/01/24  1:17 PM  Result Value Ref Range   Sodium 138 135 - 145 mmol/L   Potassium 3.8 3.5 - 5.1 mmol/L   Chloride 106 98 - 111 mmol/L   CO2 21 (L) 22 - 32 mmol/L   Glucose, Bld 144 (H) 70 - 99 mg/dL   BUN 23 8 - 23 mg/dL   Creatinine, Ser 8.50 (H) 0.61 - 1.24 mg/dL   Calcium  8.9 8.9 - 10.3 mg/dL   GFR, Estimated 49 (L) >60 mL/min   Anion gap 11 5 - 15  CBC     Status: Abnormal   Collection Time: 08/01/24  1:17 PM  Result Value Ref Range   WBC 7.6 4.0 - 10.5 K/uL   RBC 3.37 (L) 4.22 - 5.81 MIL/uL   Hemoglobin 7.0 (L) 13.0 - 17.0 g/dL   HCT 75.2 (L) 60.9 - 47.9 %   MCV 73.3 (L) 80.0 - 100.0 fL   MCH 20.8 (L) 26.0 - 34.0 pg   MCHC 28.3 (L) 30.0 - 36.0 g/dL   RDW 84.3 (H) 88.4 - 84.4 %   Platelets 435 (H) 150 - 400 K/uL   nRBC 0.0 0.0 - 0.2 %  BNP (Order if Patient has history of Heart Failure)     Status: Abnormal   Collection Time: 08/01/24  1:17 PM  Result Value Ref Range   B Natriuretic Peptide 145.9 (H) 0.0 - 100.0 pg/mL  Troponin I (High Sensitivity)     Status: None   Collection Time: 08/01/24  1:17 PM  Result Value Ref Range   Troponin I (High Sensitivity) 7 <18 ng/L  ABO/Rh     Status: None   Collection Time: 08/01/24  1:22 PM  Result Value Ref Range   ABO/RH(D)      A POS Performed at [facility], [address], [place], [place] [address]   Type and screen [facility]     Status: None (Preliminary result)   Collection Time: 08/01/24  2:23 PM  Result Value Ref Range   ABO/RH(D) A POS    Antibody Screen NEG    Sample Expiration 08/04/2024,2359    Unit Number [id number]    Blood Component Type RED CELLS,LR    Unit division 00    Status of Unit ISSUED    Transfusion Status OK TO TRANSFUSE    Crossmatch Result      Compatible Performed at [facility], [address], [place], [place] [address]    Unit Number [id number]    Blood Component Type  RED CELLS,LR    Unit division 00    Status of Unit ALLOCATED     Transfusion Status OK TO TRANSFUSE    Crossmatch Result Compatible   Hepatic function panel     Status: Abnormal   Collection Time: 08/01/24  3:18 PM  Result Value Ref Range   Total Protein 6.1 (L) 6.5 - 8.1 g/dL   Albumin 3.4 (L) 3.5 - 5.0 g/dL   AST 24 15 - 41 U/L   ALT 22 0 - 44 U/L   Alkaline Phosphatase 54 38 - 126 U/L   Total Bilirubin 1.1 0.0 - 1.2 mg/dL   Bilirubin, Direct 0.2 0.0 - 0.2 mg/dL   Indirect Bilirubin 0.9 0.3 - 0.9 mg/dL  Troponin I (High Sensitivity)     Status: None   Collection Time: 08/01/24  3:18 PM  Result Value Ref Range   Troponin I (High Sensitivity) 7 <18 ng/L  Prepare RBC (crossmatch)     Status: None   Collection Time: 08/01/24  3:55 PM  Result Value Ref Range   Order Confirmation      ORDER PROCESSED BY BLOOD BANK Performed at [facility], [address], [place], [place] [address]   Urinalysis, Routine w reflex microscopic -Urine, Clean Catch     Status: None   Collection Time: 08/01/24  3:56 PM  Result Value Ref Range   Color, Urine YELLOW YELLOW   APPearance CLEAR CLEAR   Specific Gravity, Urine 1.011 1.005 - 1.030   pH 6.0 5.0 - 8.0   Glucose, UA NEGATIVE NEGATIVE mg/dL   Hgb urine dipstick NEGATIVE NEGATIVE   Bilirubin Urine NEGATIVE NEGATIVE   Ketones, ur NEGATIVE NEGATIVE mg/dL   Protein, ur NEGATIVE NEGATIVE mg/dL   Nitrite NEGATIVE NEGATIVE   Leukocytes,Ua NEGATIVE NEGATIVE  POC occult blood, ED     Status: Abnormal   Collection Time: 08/01/24  4:44 PM  Result Value Ref Range   Fecal Occult Bld POSITIVE (A) NEGATIVE     DG Chest 2 View Result Date: 08/01/2024 EXAM: 2 VIEW(S) XRAY OF THE CHEST 08/01/2024 02:11:00 PM COMPARISON: 12/04/23 chest radiograph CLINICAL HISTORY: shob. Ordered for shortness of breath. Only on room air currently ; Per triage notes: Patient here with complaints of shortness of breath for over a month and it has been getting worse. Denies chest pain. Patient has a history of CHF. FINDINGS: LUNGS AND  PLEURA: No focal pulmonary opacity. No pulmonary edema. No pleural effusion. No pneumothorax. HEART AND MEDIASTINUM: No acute abnormality of the cardiac and mediastinal silhouettes. BONES AND SOFT TISSUES: Multilevel degenerative changes of thoracic spine. No acute osseous abnormality. IMPRESSION: 1. No acute process. Electronically signed by: Selinda Blue MD 08/01/2024 02:25 PM EDT RP Workstation: [id number]    ROS:  As stated above in the HPI otherwise negative.  Blood pressure (!) 138/55, pulse 76, temperature 98 F (36.7 C), temperature source Oral, resp. rate 13, height 6' 2 (1.88 m), weight 104.3 kg, SpO2 100%.    PE: Gen: NAD, Alert and Oriented HEENT:  [place]/AT, EOMI Neck: Supple, no LAD Lungs: CTA Bilaterally CV: RRR without M/G/R ABD: Soft, NTND, +BS Ext: No C/C/E  Assessment/Plan: 1) Melena. 2) Heme positive stool. 3) Anemia.   Further evaluation with an EGD is required.  Unclear if he truly has a peptic source for his bleeding as he takes pantoprazole .  It is possible that he is a primary failure of the PPI.  Plan: 1) EGD tomorrow.  Elyssia Strausser D 08/01/2024, 4:59  PM

## 2024-08-01 NOTE — ED Notes (Signed)
 Warm blankets/pillows provided

## 2024-08-02 ENCOUNTER — Observation Stay (HOSPITAL_COMMUNITY): Admitting: Anesthesiology

## 2024-08-02 ENCOUNTER — Encounter (HOSPITAL_COMMUNITY): Payer: Self-pay | Admitting: Internal Medicine

## 2024-08-02 ENCOUNTER — Encounter (HOSPITAL_COMMUNITY)
Admission: EM | Disposition: A | Discharge status: Home / Self Care | Payer: Self-pay | Source: Home / Self Care | Attending: Emergency Medicine

## 2024-08-02 DIAGNOSIS — I5032 Chronic diastolic (congestive) heart failure: Secondary | ICD-10-CM | POA: Diagnosis not present

## 2024-08-02 DIAGNOSIS — D131 Benign neoplasm of stomach: Secondary | ICD-10-CM

## 2024-08-02 DIAGNOSIS — I110 Hypertensive heart disease with heart failure: Secondary | ICD-10-CM

## 2024-08-02 DIAGNOSIS — D649 Anemia, unspecified: Secondary | ICD-10-CM | POA: Diagnosis not present

## 2024-08-02 DIAGNOSIS — K922 Gastrointestinal hemorrhage, unspecified: Secondary | ICD-10-CM | POA: Diagnosis not present

## 2024-08-02 DIAGNOSIS — K31819 Angiodysplasia of stomach and duodenum without bleeding: Secondary | ICD-10-CM | POA: Diagnosis not present

## 2024-08-02 DIAGNOSIS — D509 Iron deficiency anemia, unspecified: Secondary | ICD-10-CM

## 2024-08-02 DIAGNOSIS — K219 Gastro-esophageal reflux disease without esophagitis: Secondary | ICD-10-CM | POA: Diagnosis not present

## 2024-08-02 HISTORY — PX: ESOPHAGOGASTRODUODENOSCOPY: SHX5428

## 2024-08-02 LAB — COMPREHENSIVE METABOLIC PANEL WITH GFR
ALT: 23 U/L (ref 0–44)
AST: 20 U/L (ref 15–41)
Albumin: 3.3 g/dL — ABNORMAL LOW (ref 3.5–5.0)
Alkaline Phosphatase: 54 U/L (ref 38–126)
Anion gap: 7 (ref 5–15)
BUN: 18 mg/dL (ref 8–23)
CO2: 27 mmol/L (ref 22–32)
Calcium: 8.9 mg/dL (ref 8.9–10.3)
Chloride: 106 mmol/L (ref 98–111)
Creatinine, Ser: 1.3 mg/dL — ABNORMAL HIGH (ref 0.61–1.24)
GFR, Estimated: 58 mL/min — ABNORMAL LOW (ref >60)
Glucose, Bld: 113 mg/dL — ABNORMAL HIGH (ref 70–99)
Potassium: 3.7 mmol/L (ref 3.5–5.1)
Sodium: 140 mmol/L (ref 135–145)
Total Bilirubin: 2.3 mg/dL — ABNORMAL HIGH (ref 0.0–1.2)
Total Protein: 5.9 g/dL — ABNORMAL LOW (ref 6.5–8.1)

## 2024-08-02 LAB — TYPE AND SCREEN
ABO/RH(D): A POS
Antibody Screen: NEGATIVE
Unit division: 0
Unit division: 0

## 2024-08-02 LAB — GLUCOSE, CAPILLARY: Glucose-Capillary: 111 mg/dL — ABNORMAL HIGH (ref 70–99)

## 2024-08-02 LAB — BPAM RBC
Blood Product Expiration Date: 202509252359
Blood Product Expiration Date: 202509252359
ISSUE DATE / TIME: 202508311607
ISSUE DATE / TIME: 202508312239
Unit Type and Rh: 6200
Unit Type and Rh: 6200

## 2024-08-02 LAB — CBC
HCT: 28.1 % — ABNORMAL LOW (ref 39.0–52.0)
Hemoglobin: 8.4 g/dL — ABNORMAL LOW (ref 13.0–17.0)
MCH: 22.3 pg — ABNORMAL LOW (ref 26.0–34.0)
MCHC: 29.9 g/dL — ABNORMAL LOW (ref 30.0–36.0)
MCV: 74.5 fL — ABNORMAL LOW (ref 80.0–100.0)
Platelets: 340 K/uL (ref 150–400)
RBC: 3.77 MIL/uL — ABNORMAL LOW (ref 4.22–5.81)
RDW: 17.3 % — ABNORMAL HIGH (ref 11.5–15.5)
WBC: 8.1 K/uL (ref 4.0–10.5)
nRBC: 0 % (ref 0.0–0.2)

## 2024-08-02 SURGERY — EGD (ESOPHAGOGASTRODUODENOSCOPY)
Anesthesia: Monitor Anesthesia Care

## 2024-08-02 MED ORDER — PROPOFOL 500 MG/50ML IV EMUL
INTRAVENOUS | Status: DC | PRN
  Administered 2024-08-02: 120 ug/kg/min via INTRAVENOUS

## 2024-08-02 MED ORDER — PEG 3350-KCL-NA BICARB-NACL 420 G PO SOLR
4000.0000 mL | Freq: Once | ORAL | Status: AC
  Administered 2024-08-02: 4000 mL via ORAL
  Filled 2024-08-02: qty 4000

## 2024-08-02 MED ORDER — PROPOFOL 10 MG/ML IV BOLUS
INTRAVENOUS | Status: DC | PRN
Start: 2024-08-02 — End: 2024-08-02
  Administered 2024-08-02: 50 mg via INTRAVENOUS

## 2024-08-02 MED ORDER — PANTOPRAZOLE SODIUM 40 MG IV SOLR
40.0000 mg | Freq: Two times a day (BID) | INTRAVENOUS | Status: DC
  Administered 2024-08-02 – 2024-08-03 (×3): 40 mg via INTRAVENOUS
  Filled 2024-08-02 (×3): qty 10

## 2024-08-02 MED ORDER — LIDOCAINE 2% (20 MG/ML) 5 ML SYRINGE
INTRAMUSCULAR | Status: DC | PRN
  Administered 2024-08-02: 50 mg via INTRAVENOUS

## 2024-08-02 MED ORDER — SODIUM CHLORIDE 0.9 % IV SOLN: INTRAVENOUS | Status: DC

## 2024-08-02 MED ORDER — PHENYLEPHRINE 80 MCG/ML (10ML) SYRINGE FOR IV PUSH (FOR BLOOD PRESSURE SUPPORT)
PREFILLED_SYRINGE | INTRAVENOUS | Status: DC | PRN
  Administered 2024-08-02: 120 ug via INTRAVENOUS

## 2024-08-02 NOTE — Assessment & Plan Note (Signed)
 08-02-2024 GI consulted. IV protonix  bid. Pt getting EGD today.  08-03-2024 for colonoscopy today. If nothing is significant, he can go home later today.  *update. Colonoscopy negative for source of bleeding. If he has future bleeding, he may need video capsule endoscopy. DC to home with po nu-iron  150 mg bid. Repeat CBC in 4 weeks at PCP. He may need regular CBC in PCP office every 3 months to check HgB.

## 2024-08-02 NOTE — Plan of Care (Signed)

## 2024-08-02 NOTE — Assessment & Plan Note (Signed)
 08-02-2024 on IV protonix  bid.  08-03-2024 change to PO protonix  after his colonoscopy today.

## 2024-08-02 NOTE — Subjective & Objective (Signed)
 Pt seen and examined. Tolerated PRBC 2U transfusion without difficulty. HgB up to 8.4 g/dl today. Awaiting EGD today.

## 2024-08-02 NOTE — Anesthesia Preprocedure Evaluation (Signed)
 Anesthesia Evaluation  Patient identified by MRN, date of birth, ID band Patient awake    Reviewed: Allergy & Precautions, NPO status , Patient's Chart, lab work & pertinent test results  Airway Mallampati: III  TM Distance: >3 FB Neck ROM: Full    Dental   Pulmonary sleep apnea , former smoker   Pulmonary exam normal        Cardiovascular hypertension, Pt. on medications + CAD and + Cardiac Stents  Normal cardiovascular exam     Neuro/Psych CVA    GI/Hepatic Neg liver ROS, hiatal hernia,GERD  ,,  Endo/Other  diabetes    Renal/GU Renal InsufficiencyRenal disease     Musculoskeletal  (+) Arthritis ,    Abdominal   Peds  Hematology  (+) Blood dyscrasia, anemia   Anesthesia Other Findings   Reproductive/Obstetrics                              Anesthesia Physical Anesthesia Plan  ASA: 3  Anesthesia Plan: MAC   Post-op Pain Management:    Induction:   PONV Risk Score and Plan: 1 and Propofol  infusion  Airway Management Planned: Natural Airway and Nasal Cannula  Additional Equipment:   Intra-op Plan:   Post-operative Plan:   Informed Consent: I have reviewed the patients History and Physical, chart, labs and discussed the procedure including the risks, benefits and alternatives for the proposed anesthesia with the patient or authorized representative who has indicated his/her understanding and acceptance.       Plan Discussed with:   Anesthesia Plan Comments:         Anesthesia Quick Evaluation

## 2024-08-02 NOTE — Assessment & Plan Note (Signed)
 08-02-2024 stable.  08-03-2024 stable.

## 2024-08-02 NOTE — Assessment & Plan Note (Signed)
 08-02-2024 stable.continue lipitor 40 mg daily.  08-03-2024 stable.

## 2024-08-02 NOTE — Op Note (Signed)
 [facility] Patient Name: Matthew Love Procedure Date : 08/02/2024 MRN: [id number] Attending MD: Belvie Just , MD, [id number] Date of Birth: 1951-06-18 CSN: [id number] Age: 73 Admit Type: Inpatient Procedure:                Upper GI endoscopy Indications:              Heme positive stool, Melena Providers:                Belvie Just, MD, Particia Fischer, RN, Curtistine Bishop, Technician Referring MD:              Medicines:                Propofol  per Anesthesia Complications:            No immediate complications. Estimated Blood Loss:     Estimated blood loss: none. Procedure:                Pre-Anesthesia Assessment:                           - Prior to the procedure, a History and Physical                            was performed, and patient medications and                            allergies were reviewed. The patient's tolerance of                            previous anesthesia was also reviewed. The risks                            and benefits of the procedure and the sedation                            options and risks were discussed with the patient.                            All questions were answered, and informed consent                            was obtained. Prior Anticoagulants: The patient has                            taken Xarelto  (rivaroxaban ), last dose was 1 day                            prior to procedure. ASA Grade Assessment: III - A                            patient with severe systemic disease. After  reviewing the risks and benefits, the patient was                            deemed in satisfactory condition to undergo the                            procedure.                           - Sedation was administered by an anesthesia                            professional. Deep sedation was attained.                           After obtaining informed consent, the endoscope was                             passed under direct vision. Throughout the                            procedure, the patient's blood pressure, pulse, and                            oxygen saturations were monitored continuously. The                            GIF-H190 ([id number]) Olympus endoscope was introduced                            through the mouth, and advanced to the second part                            of duodenum. The upper GI endoscopy was                            accomplished without difficulty. The patient                            tolerated the procedure well. Scope In: Scope Out: Findings:      The esophagus was normal.      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 Coagulation for tissue destruction using monopolar probe       was successful.      Multiple 5 mm sessile polyps with no bleeding and no stigmata of recent       bleeding were found in the gastric fundus and in the gastric body. The       polyp was removed with a cold snare. Resection and retrieval were       complete.      The examined duodenum was normal.      The current findings cannot explain the patient's significnt anemia. APC       was applied to ablate the GAVE and a sampling of a polyp was obtained.  The polyp was consistent with a fundic gland polyp. Impression:               - Normal esophagus.                           - Gastric antral vascular ectasia without bleeding.                            Treated with a monopolar probe.                           - Multiple gastric polyps. Resected and retrieved.                           - Normal examined duodenum. Recommendation:           - Return patient to hospital ward for ongoing care.                           - Clear liquid diet.                           - Continue present medications.                           - ? Repeat EGD in the future, as an outpatient, to                             reassess the GAVE.                           - Prep for a colonoscopy tomorrow with  GI. Procedure Code(s):        --- Professional ---                           [phone], Esophagogastroduodenoscopy, flexible,                            transoral; with ablation of tumor(s), polyp(s), or                            other lesion(s) (includes pre- and post-dilation                            and guide wire passage, when performed)                           43251, 59, Esophagogastroduodenoscopy, flexible,                            transoral; with removal of tumor(s), polyp(s), or                            other lesion(s) by snare technique Diagnosis Code(s):        --- Professional ---  K31.819, Angiodysplasia of stomach and duodenum                            without bleeding                           K31.7, Polyp of stomach and duodenum                           R19.5, Other fecal abnormalities                           K92.1, Melena (includes Hematochezia) CPT copyright 2022 [employer]. All rights reserved. The codes documented in this report are preliminary and upon coder review may  be revised to meet current compliance requirements. Belvie Just, MD Belvie Just, MD 08/02/2024 10:08:34 AM This report has been signed electronically. Number of Addenda: 0

## 2024-08-02 NOTE — Anesthesia Postprocedure Evaluation (Signed)
 Anesthesia Post Note  Patient: Matthew Love  Procedure(s) Performed: EGD (ESOPHAGOGASTRODUODENOSCOPY)     Patient location during evaluation: PACU Anesthesia Type: MAC Level of consciousness: awake and alert Pain management: pain level controlled Vital Signs Assessment: post-procedure vital signs reviewed and stable Respiratory status: spontaneous breathing, nonlabored ventilation, respiratory function stable and patient connected to nasal cannula oxygen Cardiovascular status: stable and blood pressure returned to baseline Postop Assessment: no apparent nausea or vomiting Anesthetic complications: no   No notable events documented.  Last Vitals:  Vitals:   08/02/24 1108 08/02/24 1743  BP: 132/66 (!) 142/74  Pulse: 60 61  Resp: 20 20  Temp: 37 C   SpO2: 100% 100%    Last Pain:  Vitals:   08/02/24 1108  TempSrc: Oral  PainSc:                  Meranda Dechaine E

## 2024-08-02 NOTE — Anesthesia Procedure Notes (Signed)
 Date/Time: 08/02/2024 9:36 AM  Performed by: Emmitt Millman, CRNAPre-anesthesia Checklist: Patient identified, Emergency Drugs available, Suction available and Patient being monitored Patient Re-evaluated:Patient Re-evaluated prior to induction Oxygen Delivery Method: Nasal cannula Preoxygenation: Pre-oxygenation with 100% oxygen Induction Type: IV induction

## 2024-08-02 NOTE — Progress Notes (Signed)
 PROGRESS NOTE    Matthew Love  FMW:[id number] DOB: 1951/07/25 DOA: 08/01/2024 PCP: Shepard Ade, MD  Subjective: Pt seen and examined. Tolerated PRBC 2U transfusion without difficulty. HgB up to 8.4 g/dl today. Awaiting EGD today.    Hospital Course: CC: SOB HPI: Matthew Love is a 73 y.o. male with medical history significant of hypertension, hyperlipidemia, GERD, CVA, CAD, diabetes, atrial fibrillation, chronic diastolic CHF, obesity, OSA, anxiety presenting with worsening weakness and shortness of breath.   Patient reports gradually worsening weakness and shortness of breath for the past month.  Has had some dark and tarry stools.   Denies fevers, chills, chest pain, abdominal pain, constipation, diarrhea, nausea, vomiting.  Significant Events: Admitted 08/01/2024 for Symptomatic anemia with HgB of 7.0 and GI bleeding   Admission Labs: WBC 7.6, HgB 6.0, plt 435 BNP 145 Na 138, K 3.8, CO2 of 21, BUN 23, Scr 1.49, glu 144 UA negative T prot 6.1, alb 3.4, AST 24, ALT 22, alk phos 54, T. Bili 1.1 Hemoccult Positive Fe 22, TIBC 424, %sat 5, ferritin 3  Admission Imaging Studies: CXR No acute process   Significant Labs:   Significant Imaging Studies:   Antibiotic Therapy: Anti-infectives (From admission, onward)    None       Procedures:   Consultants: GI    Assessment and Plan: * GI bleed 08-02-2024 GI consulted. IV protonix  bid. Pt getting EGD today.  Symptomatic anemia 08-02-2024 Tolerated PRBC 2U transfusion without difficulty. HgB up to 8.4 g/dl today. Awaiting EGD today.   Chronic diastolic CHF (congestive heart failure) (HCC) 08-02-2024 stable.  Coronary artery disease 08-02-2024 stable.  PAF (paroxysmal atrial fibrillation) (HCC) 08-02-2024 stable. On Xarelto  at home. Currently on hold due to GI bleeding.  Anxiety 08-02-2024 stable. Continue lexapro  10 mg daily.  History of CVA (cerebrovascular accident) 08-02-2024  stable.  Hyperlipidemia 08-02-2024 stable.continue lipitor 40 mg daily.  OSA (obstructive sleep apnea) 08-02-2024 stable. CPAP prn qhs  Diabetes mellitus (HCC) 08-02-2024 stable. On SSI.  GERD (gastroesophageal reflux disease) 08-02-2024 on IV protonix  bid.  Essential hypertension 08-02-2024 stable. On lopressor  12.5 mg bid. Holding aldactone  for now.  DVT prophylaxis: SCDs Start: 08/01/24 1625    Code Status: Full Code Family Communication: no family at bedside. Pt is decisional. Disposition Plan: return home Reason for continuing need for hospitalization: getting EGD today. On IV protonix .  Objective: Vitals:   08/02/24 1000 08/02/24 1015 08/02/24 1030 08/02/24 1108  BP: 108/69 117/62 116/66 132/66  Pulse: 61 (!) 56 (!) 55 60  Resp: (!) 23 10 14 20   Temp:    98.6 F (37 C)  TempSrc:    Oral  SpO2: 95% 98% 100% 100%  Weight:      Height:        Intake/Output Summary (Last 24 hours) at 08/02/2024 1147 Last data filed at 08/02/2024 0949 Gross per 24 hour  Intake 954.58 ml  Output 1 ml  Net 953.58 ml   Filed Weights   08/01/24 1315 08/02/24 0910  Weight: 104.3 kg 104.3 kg    Examination:  Physical Exam Vitals and nursing note reviewed.  Constitutional:      General: He is not in acute distress.    Appearance: Normal appearance. He is not toxic-appearing.  HENT:     Head: Normocephalic and atraumatic.     Nose: Nose normal.  Eyes:     General: No scleral icterus. Cardiovascular:     Rate and Rhythm: Normal rate and regular rhythm.  Pulmonary:  Effort: Pulmonary effort is normal. No respiratory distress.     Breath sounds: Normal breath sounds. No wheezing.  Abdominal:     General: Bowel sounds are normal. There is no distension.     Palpations: Abdomen is soft.     Tenderness: There is no abdominal tenderness.  Musculoskeletal:     Right lower leg: No edema.     Left lower leg: No edema.  Skin:    General: Skin is warm and dry.     Capillary  Refill: Capillary refill takes less than 2 seconds.  Neurological:     General: No focal deficit present.     Mental Status: He is alert and oriented to person, place, and time.     Data Reviewed: I have personally reviewed following labs and imaging studies  CBC: Recent Labs  Lab 08/01/24 1317 08/01/24 2236 08/02/24 0318  WBC 7.6 7.7 8.1  HGB 7.0* 6.8* 8.4*  HCT 24.7* 23.3* 28.1*  MCV 73.3* 73.0* 74.5*  PLT 435* 317 340   Basic Metabolic Panel: Recent Labs  Lab 08/01/24 1317 08/02/24 0318  NA 138 140  K 3.8 3.7  CL 106 106  CO2 21* 27  GLUCOSE 144* 113*  BUN 23 18  CREATININE 1.49* 1.30*  CALCIUM  8.9 8.9   GFR: Estimated Creatinine Clearance: 65.1 mL/min (A) (by C-G formula based on SCr of 1.3 mg/dL (H)). Liver Function Tests: Recent Labs  Lab 08/01/24 1518 08/02/24 0318  AST 24 20  ALT 22 23  ALKPHOS 54 54  BILITOT 1.1 2.3*  PROT 6.1* 5.9*  ALBUMIN 3.4* 3.3*   BNP (last 3 results) Recent Labs    08/01/24 1317  BNP 145.9*   Anemia Panel: Recent Labs    08/01/24 2236  FERRITIN 3*  TIBC 424  IRON  22*   Radiology Studies: DG Chest 2 View Result Date: 08/01/2024 EXAM: 2 VIEW(S) XRAY OF THE CHEST 08/01/2024 02:11:00 PM COMPARISON: 12/04/23 chest radiograph CLINICAL HISTORY: shob. Ordered for shortness of breath. Only on room air currently ; Per triage notes: Patient here with complaints of shortness of breath for over a month and it has been getting worse. Denies chest pain. Patient has a history of CHF. FINDINGS: LUNGS AND PLEURA: No focal pulmonary opacity. No pulmonary edema. No pleural effusion. No pneumothorax. HEART AND MEDIASTINUM: No acute abnormality of the cardiac and mediastinal silhouettes. BONES AND SOFT TISSUES: Multilevel degenerative changes of thoracic spine. No acute osseous abnormality. IMPRESSION: 1. No acute process. Electronically signed by: Selinda Blue MD 08/01/2024 02:25 PM EDT RP Workstation: [id number]   Scheduled Meds:   allopurinol   300 mg Oral Daily   atorvastatin   40 mg Oral q1800   escitalopram   10 mg Oral Daily   insulin  aspart  0-15 Units Subcutaneous TID WC   metoprolol  tartrate  12.5 mg Oral BID   pantoprazole  (PROTONIX ) IV  40 mg Intravenous Q12H   sodium chloride  flush  3 mL Intravenous Q12H   Continuous Infusions:   LOS: 0 days   Time spent: 60 minutes  Camellia Door, DO  [facility]  08/02/2024, 11:47 AM

## 2024-08-02 NOTE — Transfer of Care (Signed)
 Immediate Anesthesia Transfer of Care Note  Patient: Matthew Love  Procedure(s) Performed: EGD (ESOPHAGOGASTRODUODENOSCOPY)  Patient Location: Endoscopy Unit  Anesthesia Type:MAC  Level of Consciousness: drowsy  Airway & Oxygen Therapy: Patient Spontanous Breathing and Patient connected to nasal cannula oxygen  Post-op Assessment: Report given to RN and Post -op Vital signs reviewed and stable  Post vital signs: Reviewed and stable  Last Vitals:  Vitals Value Taken Time  BP 106/59   Temp    Pulse 55 08/02/24 09:56  Resp 17 08/02/24 09:56  SpO2 97 % 08/02/24 09:56  Vitals shown include unfiled device data.  Last Pain:  Vitals:   08/02/24 0910  TempSrc: Temporal  PainSc: 0-No pain         Complications: No notable events documented.

## 2024-08-02 NOTE — Assessment & Plan Note (Signed)
 08-02-2024 stable. On lopressor  12.5 mg bid. Holding aldactone  for now.  08-03-2024 stable. Continue to hold aldactone . On lopressor  12.5 mg bid.

## 2024-08-02 NOTE — Assessment & Plan Note (Signed)
 08-02-2024 stable. Continue lexapro  10 mg daily.  08-03-2024 stable.

## 2024-08-02 NOTE — Assessment & Plan Note (Signed)
 08-02-2024 stable. On SSI.  08-03-2024 stable.

## 2024-08-02 NOTE — Hospital Course (Signed)
 CC: SOB HPI: Matthew Love is a 72 y.o. male with medical history significant of hypertension, hyperlipidemia, GERD, CVA, CAD, diabetes, atrial fibrillation, chronic diastolic CHF, obesity, OSA, anxiety presenting with worsening weakness and shortness of breath.   Patient reports gradually worsening weakness and shortness of breath for the past month.  Has had some dark and tarry stools.   Denies fevers, chills, chest pain, abdominal pain, constipation, diarrhea, nausea, vomiting.  Significant Events: Admitted 08/01/2024 for Symptomatic anemia with HgB of 7.0 and GI bleeding 08-01-2024 GI consulted for GI bleeding 08-02-2024 EGD performed that showed GAVE 08-03-2024 colonoscopy negative for source of bleeding.  Admission Labs: WBC 7.6, HgB 6.0, plt 435 BNP 145 Na 138, K 3.8, CO2 of 21, BUN 23, Scr 1.49, glu 144 UA negative T prot 6.1, alb 3.4, AST 24, ALT 22, alk phos 54, T. Bili 1.1 Hemoccult Positive Fe 22, TIBC 424, %sat 5, ferritin 3  Admission Imaging Studies: CXR No acute process   Significant Labs:   Significant Imaging Studies:   Antibiotic Therapy: Anti-infectives (From admission, onward)    None       Procedures: EGD that showed GAVE Colonoscopy that was negative for bleeding source  Consultants: GI

## 2024-08-02 NOTE — Assessment & Plan Note (Signed)
 08-02-2024 Tolerated PRBC 2U transfusion without difficulty. HgB up to 8.4 g/dl today. Awaiting EGD today.   08-03-2024 s/p 2 units. HgB up to 8.7 today. He does not need further transfusion at this point.

## 2024-08-02 NOTE — Progress Notes (Signed)
 Transition of Care [facility]) - Inpatient Brief Assessment   Patient Details  Name: Matthew Love MRN: [id number] Date of Birth: [birth year]  [facility] CM/SW Contact:    Rosaline JONELLE Joe, RN Phone Number: 08/02/2024, 3:17 PM   Clinical Narrative: Patient admitted from home with Gi bleed.  GI procedure today.  No IP Care management needs at this time and patient will likely return home when stable for discharge.   Transition of Care Asessment: Insurance and Status: (P) Insurance coverage has been reviewed Patient has primary care physician: (P) Yes Home environment has been reviewed: (P) from home Prior level of function:: (P) self Prior/Current Home Services: (P) No current home services Social Drivers of Health Review: (P) SDOH reviewed no interventions necessary Readmission risk has been reviewed: (P) Yes Transition of care needs: (P) no transition of care needs at this time

## 2024-08-02 NOTE — Assessment & Plan Note (Signed)
 08-02-2024 stable. On Xarelto  at home. Currently on hold due to GI bleeding.  08-03-2024 stable. Will need to restart Xarelto  when cleared by GI.

## 2024-08-02 NOTE — Interval H&P Note (Signed)
 History and Physical Interval Note:  08/02/2024 9:50 AM  Matthew Love  has presented today for surgery, with the diagnosis of GI bleed.  The various methods of treatment have been discussed with the patient and family. After consideration of risks, benefits and other options for treatment, the patient has consented to  Procedure(s): EGD (ESOPHAGOGASTRODUODENOSCOPY) (N/A) as a surgical intervention.  The patient's history has been reviewed, patient examined, no change in status, stable for surgery.  I have reviewed the patient's chart and labs.  Questions were answered to the patient's satisfaction.     Gerhart Ruggieri D

## 2024-08-02 NOTE — Assessment & Plan Note (Signed)
 08-02-2024 stable. CPAP prn qhs

## 2024-08-03 ENCOUNTER — Encounter (HOSPITAL_COMMUNITY): Payer: Self-pay | Admitting: Internal Medicine

## 2024-08-03 ENCOUNTER — Ambulatory Visit (HOSPITAL_COMMUNITY)

## 2024-08-03 ENCOUNTER — Observation Stay (HOSPITAL_COMMUNITY)

## 2024-08-03 ENCOUNTER — Encounter (HOSPITAL_COMMUNITY)
Admission: EM | Disposition: A | Discharge status: Home / Self Care | Payer: Self-pay | Source: Home / Self Care | Attending: Emergency Medicine

## 2024-08-03 DIAGNOSIS — I2510 Atherosclerotic heart disease of native coronary artery without angina pectoris: Secondary | ICD-10-CM

## 2024-08-03 DIAGNOSIS — Z87891 Personal history of nicotine dependence: Secondary | ICD-10-CM

## 2024-08-03 DIAGNOSIS — I5032 Chronic diastolic (congestive) heart failure: Secondary | ICD-10-CM | POA: Diagnosis not present

## 2024-08-03 DIAGNOSIS — D62 Acute posthemorrhagic anemia: Secondary | ICD-10-CM

## 2024-08-03 DIAGNOSIS — K5730 Diverticulosis of large intestine without perforation or abscess without bleeding: Secondary | ICD-10-CM

## 2024-08-03 DIAGNOSIS — K921 Melena: Secondary | ICD-10-CM

## 2024-08-03 DIAGNOSIS — D649 Anemia, unspecified: Secondary | ICD-10-CM | POA: Diagnosis not present

## 2024-08-03 DIAGNOSIS — K648 Other hemorrhoids: Principal | ICD-10-CM

## 2024-08-03 DIAGNOSIS — D509 Iron deficiency anemia, unspecified: Secondary | ICD-10-CM | POA: Insufficient documentation

## 2024-08-03 DIAGNOSIS — K31819 Angiodysplasia of stomach and duodenum without bleeding: Secondary | ICD-10-CM | POA: Diagnosis not present

## 2024-08-03 DIAGNOSIS — K922 Gastrointestinal hemorrhage, unspecified: Secondary | ICD-10-CM | POA: Diagnosis not present

## 2024-08-03 HISTORY — PX: COLONOSCOPY: SHX5424

## 2024-08-03 LAB — BASIC METABOLIC PANEL WITH GFR
Anion gap: 13 (ref 5–15)
BUN: 11 mg/dL (ref 8–23)
CO2: 20 mmol/L — ABNORMAL LOW (ref 22–32)
Calcium: 8.7 mg/dL — ABNORMAL LOW (ref 8.9–10.3)
Chloride: 108 mmol/L (ref 98–111)
Creatinine, Ser: 1.1 mg/dL (ref 0.61–1.24)
GFR, Estimated: >60 mL/min (ref >60)
Glucose, Bld: 108 mg/dL — ABNORMAL HIGH (ref 70–99)
Potassium: 4 mmol/L (ref 3.5–5.1)
Sodium: 141 mmol/L (ref 135–145)

## 2024-08-03 LAB — CBC WITH DIFFERENTIAL/PLATELET
Abs Immature Granulocytes: 0.03 K/uL (ref 0.00–0.07)
Basophils Absolute: 0.1 K/uL (ref 0.0–0.1)
Basophils Relative: 1 %
Eosinophils Absolute: 0.1 K/uL (ref 0.0–0.5)
Eosinophils Relative: 2 %
HCT: 29.2 % — ABNORMAL LOW (ref 39.0–52.0)
Hemoglobin: 8.7 g/dL — ABNORMAL LOW (ref 13.0–17.0)
Immature Granulocytes: 0 %
Lymphocytes Relative: 22 %
Lymphs Abs: 1.9 K/uL (ref 0.7–4.0)
MCH: 22.4 pg — ABNORMAL LOW (ref 26.0–34.0)
MCHC: 29.8 g/dL — ABNORMAL LOW (ref 30.0–36.0)
MCV: 75.1 fL — ABNORMAL LOW (ref 80.0–100.0)
Monocytes Absolute: 0.8 K/uL (ref 0.1–1.0)
Monocytes Relative: 10 %
Neutro Abs: 5.5 K/uL (ref 1.7–7.7)
Neutrophils Relative %: 65 %
Platelets: 324 K/uL (ref 150–400)
RBC: 3.89 MIL/uL — ABNORMAL LOW (ref 4.22–5.81)
RDW: 17.3 % — ABNORMAL HIGH (ref 11.5–15.5)
WBC: 8.4 K/uL (ref 4.0–10.5)
nRBC: 0 % (ref 0.0–0.2)

## 2024-08-03 LAB — GLUCOSE, CAPILLARY
Glucose-Capillary: 108 mg/dL — ABNORMAL HIGH (ref 70–99)
Glucose-Capillary: 108 mg/dL — ABNORMAL HIGH (ref 70–99)

## 2024-08-03 SURGERY — COLONOSCOPY
Anesthesia: Monitor Anesthesia Care

## 2024-08-03 MED ORDER — PEG 3350-KCL-NA BICARB-NACL 420 G PO SOLR
4000.0000 mL | Freq: Once | ORAL | Status: DC
  Filled 2024-08-03: qty 4000

## 2024-08-03 MED ORDER — SODIUM CHLORIDE 0.9 % IV SOLN: INTRAVENOUS | Status: DC

## 2024-08-03 MED ORDER — PROPOFOL 10 MG/ML IV BOLUS
INTRAVENOUS | Status: DC | PRN
  Administered 2024-08-03 (×3): 20 mg via INTRAVENOUS
  Administered 2024-08-03: 30 mg via INTRAVENOUS

## 2024-08-03 MED ORDER — LACTATED RINGERS IV SOLN
INTRAVENOUS | Status: AC | PRN
  Administered 2024-08-03: 20 mL/h via INTRAVENOUS

## 2024-08-03 MED ORDER — EPHEDRINE SULFATE-NACL 50-0.9 MG/10ML-% IV SOSY
PREFILLED_SYRINGE | INTRAVENOUS | Status: DC | PRN
Start: 2024-08-03 — End: 2024-08-03
  Administered 2024-08-03: 5 mg via INTRAVENOUS

## 2024-08-03 MED ORDER — POLYSACCHARIDE IRON COMPLEX 150 MG PO CAPS: 150.0000 mg | ORAL_CAPSULE | Freq: Two times a day (BID) | ORAL | 0 refills | Status: DC

## 2024-08-03 MED ORDER — PROPOFOL 500 MG/50ML IV EMUL
INTRAVENOUS | Status: DC | PRN
Start: 2024-08-03 — End: 2024-08-03
  Administered 2024-08-03: 175 ug/kg/min via INTRAVENOUS

## 2024-08-03 NOTE — Discharge Summary (Signed)
 [facility] Physician Discharge Summary   Patient name: Matthew Love  Admit date:     08/01/2024  Discharge date: 08/03/2024  Attending Physician: MELVIN, ALEXANDER B [[id number]]  Discharge Physician: Camellia Door   PCP: Shepard Ade, MD  Admitted From: Home  Disposition:  Home  Recommendations for Outpatient Follow-up:  Follow up with PCP in 1-2 weeks  Home Health:No Equipment/Devices: None  Discharge Condition:Stable CODE STATUS:FULL Diet recommendation: Heart Healthy Fluid Restriction: None  Hospital Summary: CC: SOB HPI: Matthew Love is a 73 y.o. male with medical history significant of hypertension, hyperlipidemia, GERD, CVA, CAD, diabetes, atrial fibrillation, chronic diastolic CHF, obesity, OSA, anxiety presenting with worsening weakness and shortness of breath.   Patient reports gradually worsening weakness and shortness of breath for the past month.  Has had some dark and tarry stools.   Denies fevers, chills, chest pain, abdominal pain, constipation, diarrhea, nausea, vomiting.  Significant Events: Admitted 08/01/2024 for Symptomatic anemia with HgB of 7.0 and GI bleeding 08-01-2024 GI consulted for GI bleeding 08-02-2024 EGD performed that showed GAVE 08-03-2024 colonoscopy negative for source of bleeding.  Admission Labs: WBC 7.6, HgB 6.0, plt 435 BNP 145 Na 138, K 3.8, CO2 of 21, BUN 23, Scr 1.49, glu 144 UA negative T prot 6.1, alb 3.4, AST 24, ALT 22, alk phos 54, T. Bili 1.1 Hemoccult Positive Fe 22, TIBC 424, %sat 5, ferritin 3  Admission Imaging Studies: CXR No acute process   Significant Labs:   Significant Imaging Studies:   Antibiotic Therapy: Anti-infectives (From admission, onward)    None       Procedures: EGD that showed GAVE Colonoscopy that was negative for bleeding source  Consultants: GI   Hospital Course by Problem: * GI bleed 08-02-2024 GI consulted. IV protonix  bid. Pt getting EGD  today.  08-03-2024 for colonoscopy today. If nothing is significant, he can go home later today.  *update. Colonoscopy negative for source of bleeding. If he has future bleeding, he may need video capsule endoscopy. DC to home with po nu-iron  150 mg bid. Repeat CBC in 4 weeks at PCP. He may need regular CBC in PCP office every 3 months to check HgB.  GAVE (gastric antral vascular ectasia) 08-03-2024 seen on yesterday's EGD. Will need lifelong PPI BID. Discussed with pt and wife. He will need routine HgB/Hct probably every 3-6 months to monitor HgB. Also will need HgB check any time he feels excessively weak/tired or if notices several days of black stools. 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  Symptomatic anemia 08-02-2024 Tolerated PRBC 2U transfusion without difficulty. HgB up to 8.4 g/dl today. Awaiting EGD today.   08-03-2024 s/p 2 units. HgB up to 8.7 today. He does not need further transfusion at this point.  Chronic diastolic CHF (congestive heart failure) (HCC) 08-02-2024 stable.  08-03-2024 stable.  Coronary artery disease 08-02-2024 stable.  08-03-2024 stable.  PAF (paroxysmal atrial fibrillation) (HCC) 08-02-2024 stable. On Xarelto  at home. Currently on hold due to GI bleeding.  08-03-2024 stable. Will need to restart Xarelto  when cleared by GI.  Anxiety 08-02-2024 stable. Continue lexapro  10 mg daily.  08-03-2024 stable.  History of CVA (cerebrovascular accident) 08-02-2024 stable.  08-03-2024 stable.  Hyperlipidemia 08-02-2024 stable.continue lipitor 40 mg daily.  08-03-2024 stable.  OSA (obstructive sleep apnea) 08-02-2024 stable. CPAP prn at bedtime  08-03-2024 stable.  Diabetes mellitus (HCC) 08-02-2024 stable. On SSI.  08-03-2024 stable.  GERD (gastroesophageal reflux disease) 08-02-2024 on IV protonix  bid.  08-03-2024 change to PO protonix  after his colonoscopy  today.  Essential hypertension 08-02-2024 stable. On lopressor  12.5 mg bid. Holding aldactone  for now.  08-03-2024 stable. Continue to hold aldactone . On lopressor  12.5 mg bid.  Iron  deficiency anemia 08-03-2024 due to chronic blood loss. Likely from new onset GAVE. DC to home with po iron . Repeat CBC in 4 weeks. May need routine monitoring of his CBC every 3 months.  Iron /TIBC/Ferritin/ %Sat    Component Value Date/Time   IRON  22 (L) 08/01/2024 2236   TIBC 424 08/01/2024 2236   FERRITIN 3 (L) 08/01/2024 2236   IRONPCTSAT 5 (L) 08/01/2024 2236     Discharge Diagnoses:  Principal Problem:   GI bleed Active Problems:   Symptomatic anemia   GAVE (gastric antral vascular ectasia)   Coronary artery disease   Chronic diastolic CHF (congestive heart failure) (HCC)   Essential hypertension   GERD (gastroesophageal reflux disease)   Diabetes mellitus (HCC)   OSA (obstructive sleep apnea)   Hyperlipidemia   History of CVA (cerebrovascular accident)   Anxiety   PAF (paroxysmal atrial fibrillation) (HCC)   Melena   Iron  deficiency anemia   Discharge Instructions  Discharge Instructions     Call MD for:  difficulty breathing, headache or visual disturbances   Complete by: As directed    Call MD for:  extreme fatigue   Complete by: As directed    Call MD for:  hives   Complete by: As directed    Call MD for:  persistant dizziness or light-headedness   Complete by: As directed    Call MD for:  persistant nausea and vomiting   Complete by: As directed    Call MD for:  redness, tenderness, or signs of infection (pain, swelling, redness, odor or green/yellow discharge around incision site)   Complete by: As directed    Call MD for:  severe uncontrolled pain   Complete by: As directed    Call MD for:  temperature >100.4   Complete by: As directed    Diet - low sodium heart healthy   Complete by: As directed    Discharge instructions   Complete by: As directed    1. Follow  up with your primary care provider in 1-2 weeks following discharge from hospital.   Increase activity slowly   Complete by: As directed       Allergies as of 08/03/2024       Reactions   Bee Venom Anaphylaxis, Itching, Swelling   Other Reaction(s): Not available   Oxycodone -acetaminophen     Unknown reaction   Hydrocodone-acetaminophen  Rash        Medication List     TAKE these medications    allopurinol  300 MG tablet Commonly known as: ZYLOPRIM  Take 300 mg by mouth daily.   atorvastatin  40 MG tablet Commonly known as: LIPITOR TAKE ONE TABLET BY MOUTH ONCE DAILY AT  6  PM   clopidogrel  75 MG tablet Commonly known as: PLAVIX  Take 1 tablet by mouth once daily   EPINEPHrine  0.3 mg/0.3 mL Soaj injection Commonly known as: EpiPen  Inject 0.3 mLs (0.3 mg total) into the muscle once. For signs or symptoms of anaphylaxis.   escitalopram  10 MG tablet Commonly known as: LEXAPRO  Take 10 mg by mouth daily.   iron  polysaccharides 150 MG capsule Commonly known as: Nu-Iron  Take 1 capsule (150 mg total) by mouth 2 (two) times daily.   metoprolol   tartrate 25 MG tablet Commonly known as: LOPRESSOR  Take 1/2 (one-half) tablet by mouth twice daily   multivitamin tablet Take 1 tablet by mouth daily.   nitroGLYCERIN  0.4 MG SL tablet Commonly known as: NITROSTAT  Place 1 tablet (0.4 mg total) under the tongue every 5 (five) minutes as needed for chest pain. Make sure you are sitting when you take this medication. Do not take more than 3 doses in 15 minutes. If you are still having chest pain after 3 doses, then call 911.   Omega 3 1200 MG Caps Take 1,200 mg by mouth daily.   pantoprazole  40 MG tablet Commonly known as: PROTONIX  Take 40 mg by mouth 2 (two) times daily.   rivaroxaban  20 MG Tabs tablet Commonly known as: Xarelto  Take 1 tablet (20 mg total) by mouth daily with supper.   spironolactone  25 MG tablet Commonly known as: ALDACTONE  Take 1 tablet (25 mg total) by  mouth daily.        Allergies  Allergen Reactions   Bee Venom Anaphylaxis, Itching and Swelling    Other Reaction(s): Not available   Oxycodone -Acetaminophen      Unknown reaction   Hydrocodone-Acetaminophen  Rash    Discharge Exam: Vitals:   08/03/24 1600 08/03/24 1610  BP: 106/62 124/64  Pulse: (!) 54 (!) 56  Resp: 17 16  Temp:    SpO2: 99% 100%    Physical Exam Vitals and nursing note reviewed.  Constitutional:      General: He is not in acute distress.    Appearance: Normal appearance. He is not toxic-appearing or diaphoretic.  HENT:     Head: Normocephalic.  Eyes:     General: No scleral icterus. Cardiovascular:     Rate and Rhythm: Normal rate. Rhythm irregular.  Pulmonary:     Effort: Pulmonary effort is normal. No respiratory distress.     Breath sounds: Normal breath sounds. No wheezing.  Abdominal:     General: Bowel sounds are normal. There is no distension.     Palpations: Abdomen is soft.     Tenderness: There is no abdominal tenderness.  Musculoskeletal:     Right lower leg: No edema.     Left lower leg: No edema.  Skin:    General: Skin is warm and dry.     Capillary Refill: Capillary refill takes less than 2 seconds.  Neurological:     General: No focal deficit present.     Mental Status: He is alert and oriented to person, place, and time.     The results of significant diagnostics from this hospitalization (including imaging, microbiology, ancillary and laboratory) are listed below for reference.     Labs: BNP (last 3 results) Recent Labs    08/01/24 1317  BNP 145.9*   Basic Metabolic Panel: Recent Labs  Lab 08/01/24 1317 08/02/24 0318 08/03/24 0426  NA 138 140 141  K 3.8 3.7 4.0  CL 106 106 108  CO2 21* 27 20*  GLUCOSE 144* 113* 108*  BUN 23 18 11   CREATININE 1.49* 1.30* 1.10  CALCIUM  8.9 8.9 8.7*   Liver Function Tests: Recent Labs  Lab 08/01/24 1518 08/02/24 0318  AST 24 20  ALT 22 23  ALKPHOS 54 54  BILITOT  1.1 2.3*  PROT 6.1* 5.9*  ALBUMIN 3.4* 3.3*   CBC: Recent Labs  Lab 08/01/24 1317 08/01/24 2236 08/02/24 0318 08/03/24 0426  WBC 7.6 7.7 8.1 8.4  NEUTROABS  --   --   --  5.5  HGB  7.0* 6.8* 8.4* 8.7*  HCT 24.7* 23.3* 28.1* 29.2*  MCV 73.3* 73.0* 74.5* 75.1*  PLT 435* 317 340 324   BNP: Recent Labs  Lab 08/01/24 1317  BNP 145.9*   CBG: Recent Labs  Lab 08/02/24 2217 08/03/24 0733 08/03/24 1137  GLUCAP 111* 108* 108*   Anemia work up Recent Labs    08/01/24 2236  FERRITIN 3*  TIBC 424  IRON  22*   Urinalysis    Component Value Date/Time   COLORURINE YELLOW 08/01/2024 1556   APPEARANCEUR CLEAR 08/01/2024 1556   LABSPEC 1.011 08/01/2024 1556   PHURINE 6.0 08/01/2024 1556   GLUCOSEU NEGATIVE 08/01/2024 1556   HGBUR NEGATIVE 08/01/2024 1556   BILIRUBINUR NEGATIVE 08/01/2024 1556   KETONESUR NEGATIVE 08/01/2024 1556   PROTEINUR NEGATIVE 08/01/2024 1556   NITRITE NEGATIVE 08/01/2024 1556   LEUKOCYTESUR NEGATIVE 08/01/2024 1556   Sepsis Labs Recent Labs  Lab 08/01/24 1317 08/01/24 2236 08/02/24 0318 08/03/24 0426  WBC 7.6 7.7 8.1 8.4    Procedures/Studies: DG Chest 2 View Result Date: 08/01/2024 EXAM: 2 VIEW(S) XRAY OF THE CHEST 08/01/2024 02:11:00 PM COMPARISON: 12/04/23 chest radiograph CLINICAL HISTORY: shob. Ordered for shortness of breath. Only on room air currently ; Per triage notes: Patient here with complaints of shortness of breath for over a month and it has been getting worse. Denies chest pain. Patient has a history of CHF. FINDINGS: LUNGS AND PLEURA: No focal pulmonary opacity. No pulmonary edema. No pleural effusion. No pneumothorax. HEART AND MEDIASTINUM: No acute abnormality of the cardiac and mediastinal silhouettes. BONES AND SOFT TISSUES: Multilevel degenerative changes of thoracic spine. No acute osseous abnormality. IMPRESSION: 1. No acute process. Electronically signed by: Selinda Blue MD 08/01/2024 02:25 PM EDT RP Workstation: [id number]    ECHOCARDIOGRAM COMPLETE Result Date: 07/20/2024    ECHOCARDIOGRAM REPORT   Patient Name:   CLAUDIS GIOVANELLI Date of Exam: 07/20/2024 Medical Rec #:  [id number]         Height:       73.0 in Accession #:    [id number]        Weight:       240.0 lb Date of Birth:  Dec 18, 1950        BSA:          2.325 m Patient Age:    72 years          BP:           118/68 mmHg Patient Gender: M                 HR:           62 bpm. Exam Location:  [address] Procedure: 2D Echo, Cardiac Doppler, Color Doppler, 3D Echo and Strain Analysis            (Both Spectral and Color Flow Doppler were utilized during            procedure). Indications:    R06.02 SOB  History:        Patient has prior history of Echocardiogram examinations, most                 recent 05/15/2023. CAD; Risk Factors:Hypertension and                 Dyslipidemia.  Sonographer:    Augustin Seals RDCS Referring Phys: [phone] MIHAI CROITORU IMPRESSIONS  1. Left ventricular ejection fraction, by estimation, is 60 to 65%. Left ventricular ejection fraction by 3D volume is 64 %.  The left ventricle has normal function. The left ventricle has no regional wall motion abnormalities. Left ventricular diastolic  parameters are indeterminate. The average left ventricular global longitudinal strain is -16.9 %. The global longitudinal strain is normal.  2. Right ventricular systolic function is normal. The right ventricular size is normal. Tricuspid regurgitation signal is inadequate for assessing PA pressure.  3. The mitral valve is normal in structure. No evidence of mitral valve regurgitation. No evidence of mitral stenosis.  4. The aortic valve is tricuspid. Aortic valve regurgitation is not visualized. Aortic valve sclerosis/calcification is present, without any evidence of aortic stenosis.  5. The inferior vena cava is normal in size with greater than 50% respiratory variability, suggesting right atrial pressure of 3 mmHg. FINDINGS  Left Ventricle: Left ventricular  ejection fraction, by estimation, is 60 to 65%. Left ventricular ejection fraction by 3D volume is 64 %. The left ventricle has normal function. The left ventricle has no regional wall motion abnormalities. The average left ventricular global longitudinal strain is -16.9 %. Strain was performed and the global longitudinal strain is normal. The left ventricular internal cavity size was normal in size. There is no left ventricular hypertrophy. Left ventricular diastolic parameters are indeterminate. Normal left ventricular filling pressure. Right Ventricle: The right ventricular size is normal. No increase in right ventricular wall thickness. Right ventricular systolic function is normal. Tricuspid regurgitation signal is inadequate for assessing PA pressure. Left Atrium: Left atrial size was normal in size. Right Atrium: Right atrial size was normal in size. Pericardium: There is no evidence of pericardial effusion. Mitral Valve: The mitral valve is normal in structure. No evidence of mitral valve regurgitation. No evidence of mitral valve stenosis. Tricuspid Valve: The tricuspid valve is normal in structure. Tricuspid valve regurgitation is not demonstrated. No evidence of tricuspid stenosis. Aortic Valve: The aortic valve is tricuspid. Aortic valve regurgitation is not visualized. Aortic valve sclerosis/calcification is present, without any evidence of aortic stenosis. Pulmonic Valve: The pulmonic valve was normal in structure. Pulmonic valve regurgitation is not visualized. No evidence of pulmonic stenosis. Aorta: The aortic root is normal in size and structure. Venous: The inferior vena cava is normal in size with greater than 50% respiratory variability, suggesting right atrial pressure of 3 mmHg. IAS/Shunts: No atrial level shunt detected by color flow Doppler. Additional Comments: 3D was performed not requiring image post processing on an independent workstation and was normal.  LEFT VENTRICLE PLAX 2D LVIDd:          4.50 cm         Diastology LVIDs:         2.80 cm         LV e' medial:    5.44 cm/s LV PW:         1.20 cm         LV E/e' medial:  14.3 LV IVS:        1.10 cm         LV e' lateral:   9.68 cm/s LVOT diam:     2.00 cm         LV E/e' lateral: 8.0 LV SV:         66 LV SV Index:   29              2D Longitudinal LVOT Area:     3.14 cm        Strain  2D Strain GLS   -16.3 %                                (A4C):                                2D Strain GLS   -18.3 %                                (A3C):                                2D Strain GLS   -16.0 %                                (A2C):                                2D Strain GLS   -16.9 %                                Avg:                                 3D Volume EF                                LV 3D EF:    Left                                             ventricul                                             ar                                             ejection                                             fraction                                             by 3D                                             volume is  64 %.                                 3D Volume EF:                                3D EF:        64 %                                LV EDV:       116 ml                                LV ESV:       42 ml                                LV SV:        74 ml RIGHT VENTRICLE             IVC RV S prime:     10.70 cm/s  IVC diam: 1.70 cm TAPSE (M-mode): 2.2 cm LEFT ATRIUM           Index LA diam:      4.50 cm 1.94 cm/m LA Vol (A2C): 22.0 ml 9.46 ml/m LA Vol (A4C): 43.6 ml 18.75 ml/m  AORTIC VALVE LVOT Vmax:   105.00 cm/s LVOT Vmean:  66.000 cm/s LVOT VTI:    0.211 m  AORTA Ao Root diam: 3.10 cm Ao Asc diam:  3.00 cm MITRAL VALVE MV Area (PHT): 2.99 cm    SHUNTS MV Decel Time: 254 msec    Systemic VTI:  0.21 m MV E velocity: 77.60 cm/s  Systemic Diam: 2.00 cm MV A  velocity: 82.30 cm/s MV E/A ratio:  0.94 Wilbert Bihari MD Electronically signed by Wilbert Bihari MD Signature Date/Time: 07/20/2024/1:14:36 PM    Final     EGD: Jolynn Davene Donnamae Lionel Patient Name: Sharolyn Maxin Procedure Date : 08/02/2024 MRN: [id number] Attending MD: Belvie Just , MD, [id number] Date of Birth: Apr 21, 1951 CSN: [id number] Age: 2 Admit Type: Inpatient Procedure:                Upper GI endoscopy Indications:              Heme positive stool, Melena Providers:                Belvie Just, MD, Particia Fischer, RN, Curtistine Bishop, Technician Referring MD:              Medicines:                Propofol  per Anesthesia Complications:            No immediate complications. Estimated Blood Loss:     Estimated blood loss: none. Procedure:                Pre-Anesthesia Assessment:                           -  Prior to the procedure, a History and Physical                            was performed, and patient medications and                            allergies were reviewed. The patient's tolerance of                            previous anesthesia was also reviewed. The risks                            and benefits of the procedure and the sedation                            options and risks were discussed with the patient.                            All questions were answered, and informed consent                            was obtained. Prior Anticoagulants: The patient has                            taken Xarelto  (rivaroxaban ), last dose was 1 day                            prior to procedure. ASA Grade Assessment: III - A                            patient with severe systemic disease. After                            reviewing the risks and benefits, the patient was                            deemed in satisfactory condition to undergo the                            procedure.                           - Sedation was administered by an  anesthesia                            professional. Deep sedation was attained.                           After obtaining informed consent, the endoscope was                            passed under direct vision. Throughout the  procedure, the patient's blood pressure, pulse, and                            oxygen saturations were monitored continuously. The                            [id number]-[id number] ([id number]) Olympus endoscope was introduced                            through the mouth, and advanced to the second part                            of duodenum. The upper GI endoscopy was                            accomplished without difficulty. The patient                            tolerated the procedure well. Scope In: Scope Out: Findings:      The esophagus was normal.      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 Coagulation for tissue destruction using monopolar probe       was successful.      Multiple 5 mm sessile polyps with no bleeding and no stigmata of recent       bleeding were found in the gastric fundus and in the gastric body. The       polyp was removed with a cold snare. Resection and retrieval were       complete.      The examined duodenum was normal.      The current findings cannot explain the patient's significnt anemia. APC       was applied to ablate the GAVE and a sampling of a polyp was obtained.       The polyp was consistent with a fundic gland polyp. Impression:               - Normal esophagus.                           - Gastric antral vascular ectasia without bleeding.                            Treated with a monopolar probe.                           - Multiple gastric polyps. Resected and retrieved.                           - Normal examined duodenum. Recommendation:           - Return patient to hospital ward for ongoing care.                           - Clear liquid diet.                            -  Continue present medications.                           - ? Repeat EGD in the future, as an outpatient, to                            reassess the GAVE.                           - Prep for a colonoscopy tomorrow with [facility]. Procedure Code(s):        --- Professional ---                           [phone], Esophagogastroduodenoscopy, flexible,                            transoral; with ablation of tumor(s), polyp(s), or                            other lesion(s) (includes pre- and post-dilation                            and guide wire passage, when performed)                           [id number], 59, Esophagogastroduodenoscopy, flexible,                            transoral; with removal of tumor(s), polyp(s), or                            other lesion(s) by snare technique  Colonoscopy [facility] Patient Name: Bryer Cozzolino Procedure Date : 08/03/2024 MRN: [id number] Attending MD: Inocente Hausen , MD, [id number] Date of Birth: [birth year] CSN: [id number] Age: 25 Admit Type: Inpatient Procedure:                Colonoscopy Indications:              Last colonoscopy: September 2021, Melena, Acute                            post hemorrhagic anemia Providers:                Inocente Hausen, MD, Ozell Pouch, [place], Technician Referring MD:              Medicines:                Monitored Anesthesia Care Complications:            No immediate complications. Estimated blood loss:                            None. Estimated Blood Loss:     Estimated blood loss: none. Procedure:  Pre-Anesthesia Assessment:                           - Prior to the procedure, a History and Physical                            was performed, and patient medications and                            allergies were reviewed. The patient's tolerance of                            previous anesthesia was also reviewed. The risks                             and benefits of the procedure and the sedation                            options and risks were discussed with the patient.                            All questions were answered, and informed consent                            was obtained. Prior Anticoagulants: The patient has                            taken Plavix  (clopidogrel ), last dose was 3 days                            prior to procedure. ASA Grade Assessment: III - A                            patient with severe systemic disease. After                            reviewing the risks and benefits, the patient was                            deemed in satisfactory condition to undergo the                            procedure.                           After obtaining informed consent, the colonoscope                            was passed under direct vision. Throughout the                            procedure, the patient's blood pressure, pulse, and  oxygen saturations were monitored continuously. The                            [id number] ([id number]) Olympus colonoscope was                            introduced through the anus and advanced to the                            terminal ileum. The colonoscopy was performed                            without difficulty. The patient tolerated the                            procedure well. The quality of the bowel                            preparation was good except the cecum was fair. The                            terminal ileum, ileocecal valve, appendiceal                            orifice, and rectum were photographed. Scope In: 3:02:23 PM Scope Out: 3:21:59 PM Scope Withdrawal Time: 0 hours 15 minutes 30 seconds  Total Procedure Duration: 0 hours 19 minutes 36 seconds  Findings:      The perianal and digital rectal examinations were normal. Pertinent       negatives include normal sphincter tone and no palpable rectal  lesions.      A moderate amount of semi-liquid stool was found in the transverse       colon, in the ascending colon and in the cecum. Lavage of the area was       performed using a large amount of sterile water, resulting in clearance       with fair visualization.      Multiple small-mouthed diverticula were found in the sigmoid colon.      The colon (entire examined portion) appeared normal.      The terminal ileum appeared normal.      Internal hemorrhoids were found during retroflexion. Impression:               - Stool in the transverse colon, in the ascending                            colon and in the cecum.                           - Diverticulosis in the sigmoid colon.                           - The entire examined colon is normal.                           - The examined portion of the ileum was normal.                           -  Internal hemorrhoids.                           - No specimens collected. Recommendation:           - Return patient to hospital ward for ongoing care.                           - Monitor for evidence of active bleeding and                            serial H&H                           - In the absence of active bleeding may resume                            antiplatelet/anticoagulant agents as deemed                            appropriate by primary team                           - If patient demonstrates recurrent bleeding in the                            future would recommend video capsule endoscopy for                            evaluation of small bowel pathology such as                            angioectasias which may contribute to anemia and                            melena                           - Bowel preparation during today's colonoscopy was                            adequate to evaluate for bleeding sources.                            Preparation of the right colon was fair for polyp                            detection.  Would recommend a follow-up colonoscopy                            in 5 years for polyp surveillance.                           - Results discussed with patient                           -  At the time of hospital discharge we will                            coordinate follow-up with Dr. Abran for ongoing                            management. Procedure Code(s):        --- Professional ---                           [phone], Colonoscopy, flexible; diagnostic, including                            collection of specimen(s) by brushing or washing,                            when performed (separate procedure) Time coordinating discharge: 60 mins  SIGNED:  Camellia Door, DO [facility] 08/03/24, 5:02 PM

## 2024-08-03 NOTE — Progress Notes (Signed)
 PT went down for colonoscopy.

## 2024-08-03 NOTE — Care Management Obs Status (Signed)
 MEDICARE OBSERVATION STATUS NOTIFICATION   Patient Details  Name: Matthew Love MRN: [id number] Date of Birth: 04/25/1951   Medicare Observation Status Notification Given:  Yes Verbally reviewed observation notice with Diane Behrle telephonically at [phone]. A copy will be drop off at the patient room       [facility] 08/03/2024, 9:28 AM

## 2024-08-03 NOTE — Anesthesia Postprocedure Evaluation (Signed)
 Anesthesia Post Note  Patient: Matthew Love  Procedure(s) Performed: COLONOSCOPY     Patient location during evaluation: Endoscopy Anesthesia Type: MAC Level of consciousness: awake and alert Pain management: pain level controlled Vital Signs Assessment: post-procedure vital signs reviewed and stable Respiratory status: spontaneous breathing, nonlabored ventilation and respiratory function stable Cardiovascular status: blood pressure returned to baseline and stable Postop Assessment: no apparent nausea or vomiting Anesthetic complications: no   No notable events documented.  Last Vitals:  Vitals:   08/03/24 1610 08/03/24 1708  BP: 124/64 108/72  Pulse: (!) 56 61  Resp: 16   Temp:    SpO2: 100%     Last Pain:  Vitals:   08/03/24 1610  TempSrc:   PainSc: 0-No pain                 Garnette FORBES Skillern

## 2024-08-03 NOTE — Assessment & Plan Note (Signed)
 08-03-2024 seen on yesterday's EGD. Will need lifelong PPI BID. Discussed with pt and wife. He will need routine HgB/Hct probably every 3-6 months to monitor HgB. Also will need HgB check any time he feels excessively weak/tired or if notices several days of black stools. 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

## 2024-08-03 NOTE — H&P (Signed)
 [facility] History and Physical   Primary Care Physician:  Shepard Ade, MD   Reason for Procedure:  Melena, anemia  Plan:    Colonoscopy     HPI: Matthew Love is a 73 y.o. male undergoing colonoscopy for evaluation of melena and anemia.  Patient has a history of CAD, CVA and A-fib on Plavix  and Xarelto  admitted with weakness, SOB and melena.  Hemoglobin 7 with baseline hemoglobin around 11.  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  No active bleeding and this was not thought to be source of bleeding.  Colonoscopy is performed today to evaluate for source of melena and anemia.  Last colonoscopy was performed in 2021 disclosing a 5 mm ascending colon polyp which was a tubular adenoma as well as sigmoid diverticula.  Xarelto  and Plavix  have been on hold since admission.   Past Medical History:  Diagnosis Date   Anxiety    Arthritis    Coronary artery disease    a. NST 08/2016: high-risk w/ inferior and lateral wall ischemia  b. cath 09/2016: 80% mid-LAD and 90% distal LAD stenosis. Placement of 2 DES. Mild nonobstructive dz along RCA and LCx.   Diabetes mellitus    A1C 6 weeks ago as of 09/08/14 was 6.2. Not on oral meds.   GERD (gastroesophageal reflux disease)    H/O cardiovascular stress test    ETT-Myoview  11/11: Inferior fixed defect likely due to diaphragmatic attenuation with normal wall motion, EF 69%   H/O echocardiogram    Echo 11/11: EF 60-65%, grade 1 diastolic dysfunction, mild LAE   H/O hiatal hernia    Hyperlipidemia    Hypertension    Mini stroke    Mini stroke    Obesity    Pneumonia    hx   Sleep apnea    cpap 1 yr   Stroke [facility]) 2010    Past Surgical History:  Procedure Laterality Date   BUNIONECTOMY     CARDIAC CATHETERIZATION N/A 09/06/2016   Procedure: Left Heart Cath and Coronary Angiography;  Surgeon: Lonni JONETTA Cash, MD;  Location: [facility] INVASIVE CV LAB;  Service: Cardiovascular;  Laterality: N/A;   CARDIAC  CATHETERIZATION N/A 09/06/2016   Procedure: Coronary Stent Intervention;  Surgeon: Lonni JONETTA Cash, MD;  Location: [facility] INVASIVE CV LAB;  Service: Cardiovascular;  Laterality: N/A;   COLONOSCOPY  2011   CORONARY STENT PLACEMENT  09/06/2016   Severe stenosis mid LAD, s/p successful PTCA/DES x 1   FOOT SURGERY Left    bunion   Hydrocell Removal     KNEE SURGERY Left    arthroscopy   NASAL SEPTOPLASTY W/ TURBINOPLASTY N/A 09/08/2014   Procedure: NASAL SEPTOPLASTY WITH TURBINATE REDUCTION;  Surgeon: Marlyce Finer, MD;  Location: [facility] OR;  Service: ENT;  Laterality: N/A;   TONSILLECTOMY      Prior to Admission medications   Medication Sig Start Date End Date Taking? Authorizing Provider  allopurinol  (ZYLOPRIM ) 300 MG tablet Take 300 mg by mouth daily.   Yes [provider]  atorvastatin  (LIPITOR) 40 MG tablet TAKE ONE TABLET BY MOUTH ONCE DAILY AT  6  PM 03/18/17  Yes Cash Lonni JONETTA, MD  clopidogrel  (PLAVIX ) 75 MG tablet Take 1 tablet by mouth once daily 01/22/21  Yes Nahser, Aleene PARAS, MD  escitalopram  (LEXAPRO ) 10 MG tablet Take 10 mg by mouth daily.  02/11/13  Yes [provider]  metoprolol  tartrate (LOPRESSOR ) 25 MG tablet Take 1/2 (one-half) tablet by mouth twice daily 06/10/24  Yes Croitoru, Mihai, MD  Multiple Vitamin (MULTIVITAMIN) tablet Take 1 tablet by mouth daily.   Yes [provider]  OMEGA 3 1200 MG CAPS Take 1,200 mg by mouth daily.   Yes [provider]  pantoprazole  (PROTONIX ) 40 MG tablet Take 40 mg by mouth 2 (two) times daily. 07/17/17  Yes [provider]  rivaroxaban  (XARELTO ) 20 MG TABS tablet Take 1 tablet (20 mg total) by mouth daily with supper. 12/18/23  Yes Nahser, Aleene PARAS, MD  spironolactone  (ALDACTONE ) 25 MG tablet Take 1 tablet (25 mg total) by mouth daily. 12/16/23  Yes Nahser, Aleene PARAS, MD  EPINEPHrine  (EPIPEN ) 0.3 mg/0.3 mL SOAJ injection Inject 0.3 mLs (0.3 mg total) into the muscle once. For signs or symptoms  of anaphylaxis. Patient not taking: No sig reported 02/20/14   Horton, Charmaine FALCON, MD  nitroGLYCERIN  (NITROSTAT ) 0.4 MG SL tablet Place 1 tablet (0.4 mg total) under the tongue every 5 (five) minutes as needed for chest pain. Make sure you are sitting when you take this medication. Do not take more than 3 doses in 15 minutes. If you are still having chest pain after 3 doses, then call 911. 06/02/24   Croitoru, Jerel, MD    Current Facility-Administered Medications  Medication Dose Route Frequency Provider Last Rate Last Admin   0.9 %  sodium chloride  infusion   Intravenous Continuous Rollin Dover, MD       acetaminophen  (TYLENOL ) tablet 650 mg  650 mg Oral Q6H PRN Seena Marsa NOVAK, MD       Or   acetaminophen  (TYLENOL ) suppository 650 mg  650 mg Rectal Q6H PRN Seena Marsa NOVAK, MD       allopurinol  (ZYLOPRIM ) tablet 300 mg  300 mg Oral Daily Melvin, Alexander B, MD   300 mg at 08/03/24 9085   atorvastatin  (LIPITOR) tablet 40 mg  40 mg Oral q1800 Melvin, Alexander B, MD   40 mg at 08/02/24 1819   escitalopram  (LEXAPRO ) tablet 10 mg  10 mg Oral Daily Melvin, Alexander B, MD   10 mg at 08/03/24 9085   insulin  aspart (novoLOG ) injection 0-15 Units  0-15 Units Subcutaneous TID WC Melvin, Alexander B, MD   3 Units at 08/02/24 1700   metoprolol  tartrate (LOPRESSOR ) tablet 12.5 mg  12.5 mg Oral BID Laurence Locus, DO   12.5 mg at 08/03/24 0915   pantoprazole  (PROTONIX ) injection 40 mg  40 mg Intravenous Q12H Laurence Locus, DO   40 mg at 08/03/24 0915   polyethylene glycol (MIRALAX  / GLYCOLAX ) packet 17 g  17 g Oral Daily PRN Seena Marsa NOVAK, MD       polyethylene glycol-electrolytes (NuLYTELY ) solution 4,000 mL  4,000 mL Oral Once Hung, Patrick, MD       sodium chloride  flush (NS) 0.9 % injection 3 mL  3 mL Intravenous Q12H Seena Marsa NOVAK, MD   3 mL at 08/03/24 0915    Allergies as of 08/01/2024 - Review Complete 08/01/2024  Allergen Reaction Noted   Bee venom Anaphylaxis, Itching, and Swelling  02/20/2014   Oxycodone -acetaminophen   04/04/2021   Hydrocodone-acetaminophen  Rash 04/06/2010    Family History  Problem Relation Age of Onset   Prostate cancer Father    Colon cancer Father    Heart attack Mother    Heart disease Other        paternal grandparents   Alzheimer's disease Maternal Grandmother    Stomach cancer Neg Hx     Social History   Socioeconomic History  Marital status: Married    Spouse name: Not on file   Number of children: Not on file   Years of education: Not on file   Highest education level: Not on file  Occupational History   Occupation: [employer] features editor: [employer]  Tobacco Use   Smoking status: Former    Current packs/day: 0.00    Average packs/day: 3.5 packs/day for 20.0 years (70.0 ttl pk-yrs)    Types: Cigarettes    Start date: 12/02/1964    Quit date: 12/02/1984    Years since quitting: 39.6   Smokeless tobacco: Never  Vaping Use   Vaping status: Never Used  Substance and Sexual Activity   Alcohol  use: No    Comment: quit 85   Drug use: No   Sexual activity: Not on file  Other Topics Concern   Not on file  Social History Narrative   Not on file   Social Drivers of Health   Financial Resource Strain: Not on file  Food Insecurity: No Food Insecurity (08/01/2024)   Hunger Vital Sign    Worried About Running Out of Food in the Last Year: Never true    Ran Out of Food in the Last Year: Never true  Transportation Needs: No Transportation Needs (08/01/2024)   PRAPARE - Administrator, Civil Service (Medical): No    Lack of Transportation (Non-Medical): No  Physical Activity: Not on file  Stress: Not on file  Social Connections: Socially Integrated (08/01/2024)   Social Connection and Isolation Panel    Frequency of Communication with Friends and Family: Twice a week    Frequency of Social Gatherings with Friends and Family: Twice a week    Attends Religious Services: 1 to 4 times per year    Active  Member of [employer] or Organizations: Yes    Attends Banker Meetings: 1 to 4 times per year    Marital Status: Married  Catering manager Violence: Not At Risk (08/01/2024)   Humiliation, Afraid, Rape, and Kick questionnaire    Fear of Current or Ex-Partner: No    Emotionally Abused: No    Physically Abused: No    Sexually Abused: No    Review of Systems:  All other review of systems negative except as mentioned in the HPI.  Physical Exam: Vital signs BP 120/66 (BP Location: Right Arm)   Pulse 66   Temp 98.1 F (36.7 C) (Oral)   Resp 16   Ht 6' 2 (1.88 m)   Wt 104.3 kg   SpO2 97%   BMI 29.53 kg/m   General:   Alert,  Well-developed, well-nourished, pleasant and cooperative in NAD Lungs:  Clear throughout to auscultation.   Heart:  Regular rate and rhythm; no murmurs, clicks, rubs,  or gallops. Abdomen:  Soft, nontender and nondistended. Normal bowel sounds.   Neuro/Psych:  Normal mood and affect. A and O x 3  Inocente Hausen, MD [facility]

## 2024-08-03 NOTE — Op Note (Signed)
 [facility] Patient Name: Matthew Love Procedure Date : 08/03/2024 MRN: [id number] Attending MD: Inocente Hausen , MD, [id number] Date of Birth: 23-Jan-1951 CSN: [id number] Age: 73 Admit Type: Inpatient Procedure:                Colonoscopy Indications:              Last colonoscopy: September 2021, Melena, Acute                            post hemorrhagic anemia Providers:                Inocente Hausen, MD, Ozell Pouch, [place]y [place], Technician Referring MD:              Medicines:                Monitored Anesthesia Care Complications:            No immediate complications. Estimated blood loss:                            None. Estimated Blood Loss:     Estimated blood loss: none. Procedure:                Pre-Anesthesia Assessment:                           - Prior to the procedure, a History and Physical                            was performed, and patient medications and                            allergies were reviewed. The patient's tolerance of                            previous anesthesia was also reviewed. The risks                            and benefits of the procedure and the sedation                            options and risks were discussed with the patient.                            All questions were answered, and informed consent                            was obtained. Prior Anticoagulants: The patient has                            taken Plavix  (clopidogrel ), last dose was 3 days                            prior to procedure. ASA Grade  Assessment: III - A                            patient with severe systemic disease. After                            reviewing the risks and benefits, the patient was                            deemed in satisfactory condition to undergo the                            procedure.                           After obtaining informed consent, the colonoscope                             was passed under direct vision. Throughout the                            procedure, the patient's blood pressure, pulse, and                            oxygen saturations were monitored continuously. The                            CF-HQ190L ([id number]) Olympus colonoscope was                            introduced through the anus and advanced to the                            terminal ileum. The colonoscopy was performed                            without difficulty. The patient tolerated the                            procedure well. The quality of the bowel                            preparation was good except the cecum was fair. The                            terminal ileum, ileocecal valve, appendiceal                            orifice, and rectum were photographed. Scope In: 3:02:23 PM Scope Out: 3:21:59 PM Scope Withdrawal Time: 0 hours 15 minutes 30 seconds  Total Procedure Duration: 0 hours 19 minutes 36 seconds  Findings:      The perianal and digital rectal examinations were normal. Pertinent       negatives include normal sphincter tone and no palpable rectal lesions.      A  moderate amount of semi-liquid stool was found in the transverse       colon, in the ascending colon and in the cecum. Lavage of the area was       performed using a large amount of sterile water, resulting in clearance       with fair visualization.      Multiple small-mouthed diverticula were found in the sigmoid colon.      The colon (entire examined portion) appeared normal.      The terminal ileum appeared normal.      Internal hemorrhoids were found during retroflexion. Impression:               - Stool in the transverse colon, in the ascending                            colon and in the cecum.                           - Diverticulosis in the sigmoid colon.                           - The entire examined colon is normal.                           - The examined portion of the ileum was  normal.                           - Internal hemorrhoids.                           - No specimens collected. Recommendation:           - Return patient to hospital ward for ongoing care.                           - Monitor for evidence of active bleeding and                            serial H&H                           - In the absence of active bleeding may resume                            antiplatelet/anticoagulant agents as deemed                            appropriate by primary team                           - If patient demonstrates recurrent bleeding in the                            future would recommend video capsule endoscopy for                            evaluation of small bowel pathology such  as                            angioectasias which may contribute to anemia and                            melena                           - Bowel preparation during today's colonoscopy was                            adequate to evaluate for bleeding sources.                            Preparation of the right colon was fair for polyp                            detection. Would recommend a follow-up colonoscopy                            in 5 years for polyp surveillance.                           - Results discussed with patient                           - At the time of hospital discharge we will                            coordinate follow-up with Dr. Abran for ongoing                            management. Procedure Code(s):        --- Professional ---                           [phone], Colonoscopy, flexible; diagnostic, including                            collection of specimen(s) by brushing or washing,                            when performed (separate procedure) Diagnosis Code(s):        --- Professional ---                           X35.1, Other hemorrhoids                           K92.1, Melena (includes Hematochezia)                           D62, Acute  posthemorrhagic anemia                           K57.30, Diverticulosis of large intestine  without                            perforation or abscess without bleeding CPT copyright 2022 American Medical Association. All rights reserved. The codes documented in this report are preliminary and upon coder review may  be revised to meet current compliance requirements. Inocente Hausen, MD 08/03/2024 3:35:31 PM This report has been signed electronically. Number of Addenda: 0

## 2024-08-03 NOTE — Assessment & Plan Note (Signed)
 08-03-2024 due to chronic blood loss. Likely from new onset GAVE. DC to home with po iron . Repeat CBC in 4 weeks. May need routine monitoring of his CBC every 3 months.  Iron /TIBC/Ferritin/ %Sat    Component Value Date/Time   IRON  22 (L) 08/01/2024 2236   TIBC 424 08/01/2024 2236   FERRITIN 3 (L) 08/01/2024 2236   IRONPCTSAT 5 (L) 08/01/2024 2236

## 2024-08-03 NOTE — Plan of Care (Signed)

## 2024-08-03 NOTE — Anesthesia Preprocedure Evaluation (Addendum)
 Anesthesia Evaluation  Patient identified by MRN, date of birth, ID band Patient awake    Reviewed: Allergy & Precautions, NPO status , Patient's Chart, lab work & pertinent test results  Airway Mallampati: II  TM Distance: >3 FB Neck ROM: Full    Dental no notable dental hx. (+) Dental Advisory Given   Pulmonary sleep apnea , former smoker   Pulmonary exam normal breath sounds clear to auscultation       Cardiovascular hypertension, Pt. on medications + CAD, + Cardiac Stents and +CHF  Normal cardiovascular exam Rhythm:Regular Rate:Normal  Echo 07/2024  1. Left ventricular ejection fraction, by estimation, is 60 to 65%. Left  ventricular ejection fraction by 3D volume is 64 %. The left ventricle has  normal function. The left ventricle has no regional wall motion  abnormalities. Left ventricular diastolic   parameters are indeterminate. The average left ventricular global  longitudinal strain is -16.9 %. The global longitudinal strain is normal.   2. Right ventricular systolic function is normal. The right ventricular  size is normal. Tricuspid regurgitation signal is inadequate for assessing  PA pressure.   3. The mitral valve is normal in structure. No evidence of mitral valve  regurgitation. No evidence of mitral stenosis.   4. The aortic valve is tricuspid. Aortic valve regurgitation is not  visualized. Aortic valve sclerosis/calcification is present, without any  evidence of aortic stenosis.   5. The inferior vena cava is normal in size with greater than 50%  respiratory variability, suggesting right atrial pressure of 3 mmHg.      Neuro/Psych   Anxiety     CVA    GI/Hepatic Neg liver ROS, hiatal hernia,  Medicated and Controlled,,  Endo/Other  diabetes    Renal/GU Renal InsufficiencyRenal disease     Musculoskeletal  (+) Arthritis ,    Abdominal   Peds  Hematology  (+) Blood dyscrasia, anemia    Anesthesia Other Findings   Reproductive/Obstetrics                              Anesthesia Physical Anesthesia Plan  ASA: 3  Anesthesia Plan: MAC   Post-op Pain Management: Minimal or no pain anticipated   Induction:   PONV Risk Score and Plan: 1 and Propofol  infusion, TIVA and Treatment may vary due to age or medical condition  Airway Management Planned: Natural Airway and Nasal Cannula  Additional Equipment:   Intra-op Plan:   Post-operative Plan:   Informed Consent: I have reviewed the patients History and Physical, chart, labs and discussed the procedure including the risks, benefits and alternatives for the proposed anesthesia with the patient or authorized representative who has indicated his/her understanding and acceptance.     Dental advisory given  Plan Discussed with: CRNA  Anesthesia Plan Comments:          Anesthesia Quick Evaluation

## 2024-08-03 NOTE — Transfer of Care (Signed)
 Immediate Anesthesia Transfer of Care Note  Patient: Matthew Love  Procedure(s) Performed: COLONOSCOPY  Patient Location: PACU and Endoscopy Unit  Anesthesia Type:MAC  Level of Consciousness: awake and drowsy  Airway & Oxygen Therapy: Patient Spontanous Breathing and Patient connected to face mask oxygen  Post-op Assessment: Report given to RN and Post -op Vital signs reviewed and stable  Post vital signs: Reviewed and stable  Last Vitals:  Vitals Value Taken Time  BP 108/58 08/03/24 15:30  Temp 36.2 C 08/03/24 15:30  Pulse 68 08/03/24 15:31  Resp 18 08/03/24 15:31  SpO2 100 % 08/03/24 15:31  Vitals shown include unfiled device data.  Last Pain:  Vitals:   08/03/24 1530  TempSrc: Temporal  PainSc: Asleep         Complications: No notable events documented.

## 2024-08-03 NOTE — Progress Notes (Signed)
 PROGRESS NOTE    STAR CHEESE  FMW:[id number] DOB: September 14, 1951 DOA: 08/01/2024 PCP: Shepard Ade, MD  Subjective: Pt seen and examined.  Met with wife at bedside. HgB up to 8.7 g/dl today. EGD yesterday showed GAVE. Awaiting colonoscopy today.   Hospital Course: CC: SOB HPI: Matthew Love is a 73 y.o. male with medical history significant of hypertension, hyperlipidemia, GERD, CVA, CAD, diabetes, atrial fibrillation, chronic diastolic CHF, obesity, OSA, anxiety presenting with worsening weakness and shortness of breath.   Patient reports gradually worsening weakness and shortness of breath for the past month.  Has had some dark and tarry stools.   Denies fevers, chills, chest pain, abdominal pain, constipation, diarrhea, nausea, vomiting.  Significant Events: Admitted 08/01/2024 for Symptomatic anemia with HgB of 7.0 and GI bleeding   Admission Labs: WBC 7.6, HgB 6.0, plt 435 BNP 145 Na 138, K 3.8, CO2 of 21, BUN 23, Scr 1.49, glu 144 UA negative T prot 6.1, alb 3.4, AST 24, ALT 22, alk phos 54, T. Bili 1.1 Hemoccult Positive Fe 22, TIBC 424, %sat 5, ferritin 3  Admission Imaging Studies: CXR No acute process   Significant Labs:   Significant Imaging Studies:   Antibiotic Therapy: Anti-infectives (From admission, onward)    None       Procedures:   Consultants: GI    Assessment and Plan: * GI bleed 08-02-2024 GI consulted. IV protonix  bid. Pt getting EGD today.  08-03-2024 for colonoscopy today. If nothing is significant, he can go home later today.  GAVE (gastric antral vascular ectasia) 08-03-2024 seen on yesterday's EGD. Will need lifelong PPI BID. Discussed with pt and wife. He will need routine HgB/Hct probably every 3-6 months to monitor HgB. Also will need HgB check any time he feels excessively weak/tired or if notices several days of black stools. Given his GAVE, he will likely bleed intermittently since he will nee to be on Eliquis   due to afib and prior hx of CVA.  Symptomatic anemia 08-02-2024 Tolerated PRBC 2U transfusion without difficulty. HgB up to 8.4 g/dl today. Awaiting EGD today.   08-03-2024 s/p 2 units. HgB up to 8.7 today. He does not need further transfusion at this point.  Chronic diastolic CHF (congestive heart failure) (HCC) 08-02-2024 stable.  08-03-2024 stable.  Coronary artery disease 08-02-2024 stable.  08-03-2024 stable.  PAF (paroxysmal atrial fibrillation) (HCC) 08-02-2024 stable. On Xarelto  at home. Currently on hold due to GI bleeding.  08-03-2024 stable. Will need to restart Xarelto  when cleared by GI.  Anxiety 08-02-2024 stable. Continue lexapro  10 mg daily.  08-03-2024 stable.  History of CVA (cerebrovascular accident) 08-02-2024 stable.  08-03-2024 stable.  Hyperlipidemia 08-02-2024 stable.continue lipitor 40 mg daily.  08-03-2024 stable.  OSA (obstructive sleep apnea) 08-02-2024 stable. CPAP prn at bedtime  08-03-2024 stable.  Diabetes mellitus (HCC) 08-02-2024 stable. On SSI.  08-03-2024 stable.  GERD (gastroesophageal reflux disease) 08-02-2024 on IV protonix  bid.  08-03-2024 change to PO protonix  after his colonoscopy today.  Essential hypertension 08-02-2024 stable. On lopressor  12.5 mg bid. Holding aldactone  for now.  08-03-2024 stable. Continue to hold aldactone . On lopressor  12.5 mg bid.  DVT prophylaxis: SCDs Start: 08/01/24 1625    Code Status: Full Code Family Communication: discussed with pt and his wife at bedside Disposition Plan: return home Reason for continuing need for hospitalization: colonoscopy today. On IV protonix .  Objective: Vitals:   08/02/24 1930 08/03/24 0021 08/03/24 0529 08/03/24 0734  BP: 132/63 (!) 127/55 (!) 124/59 112/61  Pulse: 64 60  60 60  Resp: 18 17 16    Temp: 97.9 F (36.6 C) 98 F (36.7 C) 98 F (36.7 C) 98 F (36.7 C)  TempSrc:  Oral Oral Oral  SpO2: 100% 98% 98% 99%  Weight:      Height:         Intake/Output Summary (Last 24 hours) at 08/03/2024 1004 Last data filed at 08/03/2024 0530 Gross per 24 hour  Intake 150 ml  Output --  Net 150 ml   Filed Weights   08/01/24 1315 08/02/24 0910  Weight: 104.3 kg 104.3 kg    Examination:  Physical Exam Vitals and nursing note reviewed.  Constitutional:      General: He is not in acute distress.    Appearance: Normal appearance. He is not toxic-appearing or diaphoretic.  HENT:     Head: Normocephalic.  Eyes:     General: No scleral icterus. Cardiovascular:     Rate and Rhythm: Normal rate. Rhythm irregular.  Pulmonary:     Effort: Pulmonary effort is normal. No respiratory distress.     Breath sounds: Normal breath sounds. No wheezing.  Abdominal:     General: Bowel sounds are normal. There is no distension.     Palpations: Abdomen is soft.     Tenderness: There is no abdominal tenderness.  Musculoskeletal:     Right lower leg: No edema.     Left lower leg: No edema.  Skin:    General: Skin is warm and dry.     Capillary Refill: Capillary refill takes less than 2 seconds.  Neurological:     General: No focal deficit present.     Mental Status: He is alert and oriented to person, place, and time.    Data Reviewed: I have personally reviewed following labs and imaging studies  CBC: Recent Labs  Lab 08/01/24 1317 08/01/24 2236 08/02/24 0318 08/03/24 0426  WBC 7.6 7.7 8.1 8.4  NEUTROABS  --   --   --  5.5  HGB 7.0* 6.8* 8.4* 8.7*  HCT 24.7* 23.3* 28.1* 29.2*  MCV 73.3* 73.0* 74.5* 75.1*  PLT 435* 317 340 324   Basic Metabolic Panel: Recent Labs  Lab 08/01/24 1317 08/02/24 0318 08/03/24 0426  NA 138 140 141  K 3.8 3.7 4.0  CL 106 106 108  CO2 21* 27 20*  GLUCOSE 144* 113* 108*  BUN 23 18 11   CREATININE 1.49* 1.30* 1.10  CALCIUM  8.9 8.9 8.7*   GFR: Estimated Creatinine Clearance: 77 mL/min (by C-G formula based on SCr of 1.1 mg/dL). Liver Function Tests: Recent Labs  Lab 08/01/24 1518  08/02/24 0318  AST 24 20  ALT 22 23  ALKPHOS 54 54  BILITOT 1.1 2.3*  PROT 6.1* 5.9*  ALBUMIN 3.4* 3.3*   BNP (last 3 results) Recent Labs    08/01/24 1317  BNP 145.9*   CBG: Recent Labs  Lab 08/02/24 2217 08/03/24 0733  GLUCAP 111* 108*   Anemia Panel: Recent Labs    08/01/24 2236  FERRITIN 3*  TIBC 424  IRON  22*   Radiology Studies: DG Chest 2 View Result Date: 08/01/2024 EXAM: 2 VIEW(S) XRAY OF THE CHEST 08/01/2024 02:11:00 PM COMPARISON: 12/04/23 chest radiograph CLINICAL HISTORY: shob. Ordered for shortness of breath. Only on room air currently ; Per triage notes: Patient here with complaints of shortness of breath for over a month and it has been getting worse. Denies chest pain. Patient has a history of CHF. FINDINGS: LUNGS AND PLEURA: No focal  pulmonary opacity. No pulmonary edema. No pleural effusion. No pneumothorax. HEART AND MEDIASTINUM: No acute abnormality of the cardiac and mediastinal silhouettes. BONES AND SOFT TISSUES: Multilevel degenerative changes of thoracic spine. No acute osseous abnormality. IMPRESSION: 1. No acute process. Electronically signed by: Jason Poff MD 08/01/2024 02:25 PM EDT RP Workstation: [id number]    Scheduled Meds:  allopurinol   300 mg Oral Daily   atorvastatin   40 mg Oral q1800   escitalopram   10 mg Oral Daily   insulin  aspart  0-15 Units Subcutaneous TID WC   metoprolol  tartrate  12.5 mg Oral BID   pantoprazole  (PROTONIX ) IV  40 mg Intravenous Q12H   polyethylene glycol-electrolytes  4,000 mL Oral Once   sodium chloride  flush  3 mL Intravenous Q12H   Continuous Infusions:  sodium chloride        LOS: 0 days   Time spent: 60 minutes  Camellia Door, DO  [facility]  08/03/2024, 10:04 AM

## 2024-08-04 ENCOUNTER — Encounter: Payer: Self-pay | Admitting: Cardiovascular Disease

## 2024-08-04 LAB — SURGICAL PATHOLOGY

## 2024-08-04 NOTE — Telephone Encounter (Signed)
 Stop clopidogrel  and cancel PET scan please.

## 2024-08-05 ENCOUNTER — Encounter (HOSPITAL_COMMUNITY): Payer: Self-pay | Admitting: Pediatrics

## 2024-08-25 ENCOUNTER — Other Ambulatory Visit (HOSPITAL_COMMUNITY)

## 2024-09-01 ENCOUNTER — Ambulatory Visit: Admitting: Nurse Practitioner

## 2024-09-01 ENCOUNTER — Encounter: Payer: Self-pay | Admitting: Nurse Practitioner

## 2024-09-01 ENCOUNTER — Other Ambulatory Visit (INDEPENDENT_AMBULATORY_CARE_PROVIDER_SITE_OTHER)

## 2024-09-01 VITALS — BP 126/64 | HR 68 | Ht 71.0 in | Wt 243.4 lb

## 2024-09-01 DIAGNOSIS — I509 Heart failure, unspecified: Secondary | ICD-10-CM | POA: Diagnosis not present

## 2024-09-01 DIAGNOSIS — D649 Anemia, unspecified: Secondary | ICD-10-CM

## 2024-09-01 DIAGNOSIS — I2510 Atherosclerotic heart disease of native coronary artery without angina pectoris: Secondary | ICD-10-CM | POA: Diagnosis not present

## 2024-09-01 DIAGNOSIS — K31819 Angiodysplasia of stomach and duodenum without bleeding: Secondary | ICD-10-CM

## 2024-09-01 DIAGNOSIS — I480 Paroxysmal atrial fibrillation: Secondary | ICD-10-CM

## 2024-09-01 NOTE — Patient Instructions (Signed)
 _______________________________________________________  If your blood pressure at your visit was 140/90 or greater, please contact your primary care physician to follow up on this.  _______________________________________________________  If you are age 74 or older, your body mass index should be between 23-30. Your Body mass index is 33.94 kg/m. If this is out of the aforementioned range listed, please consider follow up with your Primary Care Provider.  If you are age 44 or younger, your body mass index should be between 19-25. Your Body mass index is 33.94 kg/m. If this is out of the aformentioned range listed, please consider follow up with your Primary Care Provider.   ________________________________________________________  The [facility] providers would like to encourage you to use MYCHART to communicate with providers for non-urgent requests or questions.  Due to long hold times on the telephone, sending your provider a message by [facility] may be a faster and more efficient way to get a response.  Please allow 48 business hours for a response.  Please remember that this is for non-urgent requests.  _______________________________________________________  [facility] is using a team-based approach to care.  Your team is made up of your doctor and two to three APPS. Our APPS (Nurse Practitioners and Physician Assistants) work with your physician to ensure care continuity for you. They are fully qualified to address your health concerns and develop a treatment plan. They communicate directly with your gastroenterologist to care for you. Seeing the Advanced Practice Practitioners on your physician's team can help you by facilitating care more promptly, often allowing for earlier appointments, access to diagnostic testing, procedures, and other specialty referrals.   Your provider has requested that you go to the basement level for lab work before leaving today. Press B on the  elevator. The lab is located at the first door on the left as you exit the elevator.  Please purchase the following medications over the counter and take as directed: Miralax  daily at bedtime as needed  Continue omeprazole 40mg  2 times a day indefinitely. Continue oral iron  2 times a day  Contact our office if you develop black stool or rectal bleeding.  Thank you for trusting me with your gastrointestinal care!   Elida Shawl, CRNP

## 2024-09-01 NOTE — Progress Notes (Unsigned)
     09/01/2024 Matthew Love [id number] 09-24-1951   Chief Complaint:  History of Present Illness:   hypertension, hyperlipidemia, GERD, CVA, CAD, diabetes, atrial fibrillation, chronic diastolic CHF, obesity, OSA, anxiety presenting with worsening weakness and shortness of breath.   EGD 08/02/2024 showed GAVE. Colonoscopy was unrevealing.   If he has future bleeding may need capsule endoscopy   Will need life long PPI bid.   On Eliquis  due to afib and CVA  He is taking iron  twice daily. Stools a little darker.  No black stools.  Dr. Shepard, 1 to 2 weeks ago.      Latest Ref Rng & Units 08/03/2024    4:26 AM 08/02/2024    3:18 AM 08/01/2024   10:36 PM  CBC  WBC 4.0 - 10.5 K/uL 8.4  8.1  7.7   Hemoglobin 13.0 - 17.0 g/dL 8.7  8.4  6.8   Hematocrit 39.0 - 52.0 % 29.2  28.1  23.3   Platelets 150 - 400 K/uL 324  340  317         Latest Ref Rng & Units 08/03/2024    4:26 AM 08/02/2024    3:18 AM 08/01/2024    3:18 PM  CMP  Glucose 70 - 99 mg/dL 891  886    BUN 8 - 23 mg/dL 11  18    Creatinine 9.38 - 1.24 mg/dL 8.89  8.69    Sodium 864 - 145 mmol/L 141  140    Potassium 3.5 - 5.1 mmol/L 4.0  3.7    Chloride 98 - 111 mmol/L 108  106    CO2 22 - 32 mmol/L 20  27    Calcium  8.9 - 10.3 mg/dL 8.7  8.9    Total Protein 6.5 - 8.1 g/dL  5.9  6.1   Total Bilirubin 0.0 - 1.2 mg/dL  2.3  1.1   Alkaline Phos 38 - 126 U/L  54  54   AST 15 - 41 U/L  20  24   ALT 0 - 44 U/L  23  22      Current Medications, Allergies, Past Medical History, Past Surgical History, Family History and Social History were reviewed in [employer] record.   Review of Systems:   Constitutional: Negative for fever, sweats, chills or weight loss.  Respiratory: Negative for shortness of breath.   Cardiovascular: Negative for chest pain, palpitations and leg swelling.  Gastrointestinal: See HPI.  Musculoskeletal: Negative for back pain or muscle aches.  Neurological: Negative for  dizziness, headaches or paresthesias.    Physical Exam: BP 126/64 (BP Location: Left Arm, Patient Position: Sitting, Cuff Size: Large)   Pulse 68   Ht 5' 11 (1.803 m) Comment: height measured without shoes  Wt 243 lb 6 oz (110.4 kg)   BMI 33.94 kg/m  General: in no acute distress. Head: Normocephalic and atraumatic. Eyes: No scleral icterus. Conjunctiva pink . Ears: Normal auditory acuity. Mouth: Dentition intact. No ulcers or lesions.  Lungs: Clear throughout to auscultation. Heart: Regular rate and rhythm, no murmur. Abdomen: Soft, nontender and nondistended. No masses or hepatomegaly. Normal bowel sounds x 4 quadrants.  Rectal: Deferred.  Musculoskeletal: Symmetrical with no gross deformities. Extremities: No edema. Neurological: Alert oriented x 4. No focal deficits.  Psychological: Alert and cooperative. Normal mood and affect  Assessment and Recommendations: ***

## 2024-09-02 ENCOUNTER — Ambulatory Visit: Payer: Self-pay | Admitting: Nurse Practitioner

## 2024-09-02 LAB — BASIC METABOLIC PANEL WITH GFR
BUN: 16 mg/dL (ref 6–23)
CO2: 19 meq/L (ref 19–32)
Calcium: 9.4 mg/dL (ref 8.4–10.5)
Chloride: 108 meq/L (ref 96–112)
Creatinine, Ser: 1.21 mg/dL (ref 0.40–1.50)
GFR: 59.27 mL/min — ABNORMAL LOW (ref >60.00)
Glucose, Bld: 91 mg/dL (ref 70–99)
Potassium: 3.9 meq/L (ref 3.5–5.1)
Sodium: 144 meq/L (ref 135–145)

## 2024-09-02 LAB — IBC + FERRITIN
Ferritin: 5.3 ng/mL — ABNORMAL LOW (ref 22.0–322.0)
Iron: 22 ug/dL — ABNORMAL LOW (ref 42–165)
Saturation Ratios: 4.7 % — ABNORMAL LOW (ref 20.0–50.0)
TIBC: 471.8 ug/dL — ABNORMAL HIGH (ref 250.0–450.0)
Transferrin: 337 mg/dL (ref 212.0–360.0)

## 2024-09-02 LAB — CBC
HCT: 29.7 % — ABNORMAL LOW (ref 39.0–52.0)
Hemoglobin: 9.1 g/dL — ABNORMAL LOW (ref 13.0–17.0)
MCHC: 30.5 g/dL (ref 30.0–36.0)
MCV: 68 fl — ABNORMAL LOW (ref 78.0–100.0)
Platelets: 340 K/uL (ref 150.0–400.0)
RBC: 4.37 Mil/uL (ref 4.22–5.81)
RDW: 19.4 % — ABNORMAL HIGH (ref 11.5–15.5)
WBC: 6.5 K/uL (ref 4.0–10.5)

## 2024-09-03 ENCOUNTER — Other Ambulatory Visit: Payer: Self-pay

## 2024-09-03 DIAGNOSIS — D649 Anemia, unspecified: Secondary | ICD-10-CM

## 2024-09-03 MED ORDER — POLYSACCHARIDE IRON COMPLEX 150 MG PO CAPS: 150.0000 mg | ORAL_CAPSULE | Freq: Two times a day (BID) | ORAL | 0 refills | Status: DC

## 2024-09-03 NOTE — Progress Notes (Signed)
 Noted. See lab result notes regarding hematology referral for IDA management and IV iron .

## 2024-09-08 ENCOUNTER — Other Ambulatory Visit: Payer: Self-pay

## 2024-09-08 DIAGNOSIS — D649 Anemia, unspecified: Secondary | ICD-10-CM

## 2024-09-08 NOTE — Telephone Encounter (Signed)
 Rock, as patient declines hematology evaluation and IV iron  at this time, ok to send to our lab in 4 weeks for a CBC, IBC + Ferritin panel. THX.

## 2024-09-27 ENCOUNTER — Other Ambulatory Visit: Payer: Self-pay | Admitting: Pharmacist

## 2024-09-27 DIAGNOSIS — I480 Paroxysmal atrial fibrillation: Secondary | ICD-10-CM

## 2024-09-27 MED ORDER — RIVAROXABAN 20 MG PO TABS: 20.0000 mg | ORAL_TABLET | Freq: Every day | ORAL | 1 refills | Status: AC

## 2024-09-27 NOTE — Telephone Encounter (Signed)
 Prescription refill request for Xarelto  received.  Indication: PAF Last office visit: 06/02/24 Weight: 243# Age: 73 Scr: 1.21 epic 09/01/24 CrCl:  85 ml/min

## 2024-09-29 ENCOUNTER — Other Ambulatory Visit (INDEPENDENT_AMBULATORY_CARE_PROVIDER_SITE_OTHER)

## 2024-09-29 ENCOUNTER — Ambulatory Visit: Payer: Self-pay | Admitting: Nurse Practitioner

## 2024-09-29 DIAGNOSIS — D649 Anemia, unspecified: Secondary | ICD-10-CM

## 2024-09-29 DIAGNOSIS — K31819 Angiodysplasia of stomach and duodenum without bleeding: Secondary | ICD-10-CM

## 2024-09-29 LAB — IBC + FERRITIN
Ferritin: 7.3 ng/mL — ABNORMAL LOW (ref 22.0–322.0)
Iron: 107 ug/dL (ref 42–165)
Saturation Ratios: 23 % (ref 20.0–50.0)
TIBC: 464.8 ug/dL — ABNORMAL HIGH (ref 250.0–450.0)
Transferrin: 332 mg/dL (ref 212.0–360.0)

## 2024-09-29 LAB — CBC WITH DIFFERENTIAL/PLATELET
Basophils Absolute: 0.1 K/uL (ref 0.0–0.1)
Basophils Relative: 2 % (ref 0.0–3.0)
Eosinophils Absolute: 0.1 K/uL (ref 0.0–0.7)
Eosinophils Relative: 2.3 % (ref 0.0–5.0)
HCT: 34.8 % — ABNORMAL LOW (ref 39.0–52.0)
Hemoglobin: 10.6 g/dL — ABNORMAL LOW (ref 13.0–17.0)
Lymphocytes Relative: 23 % (ref 12.0–46.0)
Lymphs Abs: 1.4 K/uL (ref 0.7–4.0)
MCHC: 30.4 g/dL (ref 30.0–36.0)
MCV: 68.9 fl — ABNORMAL LOW (ref 78.0–100.0)
Monocytes Absolute: 0.6 K/uL (ref 0.1–1.0)
Monocytes Relative: 9.3 % (ref 3.0–12.0)
Neutro Abs: 3.9 K/uL (ref 1.4–7.7)
Neutrophils Relative %: 63.4 % (ref 43.0–77.0)
Platelets: 338 K/uL (ref 150.0–400.0)
RBC: 5.05 Mil/uL (ref 4.22–5.81)
RDW: 21 % — ABNORMAL HIGH (ref 11.5–15.5)
WBC: 6.2 K/uL (ref 4.0–10.5)

## 2024-10-05 ENCOUNTER — Other Ambulatory Visit: Payer: Self-pay

## 2024-10-05 DIAGNOSIS — D649 Anemia, unspecified: Secondary | ICD-10-CM

## 2024-10-06 ENCOUNTER — Other Ambulatory Visit: Payer: Self-pay

## 2024-10-06 DIAGNOSIS — D649 Anemia, unspecified: Secondary | ICD-10-CM

## 2024-10-08 ENCOUNTER — Other Ambulatory Visit: Payer: Self-pay | Admitting: Nurse Practitioner

## 2024-10-08 ENCOUNTER — Telehealth: Payer: Self-pay | Admitting: Hematology and Oncology

## 2024-10-08 NOTE — Telephone Encounter (Signed)
 Patient has been scheduled for follow-up visit per 10/08/24 LOS.  Pt aware of scheduled appt details.

## 2024-10-11 ENCOUNTER — Other Ambulatory Visit (HOSPITAL_COMMUNITY): Payer: Self-pay

## 2024-10-11 NOTE — Telephone Encounter (Signed)
 Vitamin/Mineral products are a Part D exclusion and not covered under pharmacy benefits.

## 2024-10-12 ENCOUNTER — Inpatient Hospital Stay: Attending: Hematology and Oncology

## 2024-10-12 ENCOUNTER — Other Ambulatory Visit: Payer: Self-pay

## 2024-10-12 ENCOUNTER — Other Ambulatory Visit: Payer: Self-pay | Admitting: Hematology and Oncology

## 2024-10-12 ENCOUNTER — Encounter: Payer: Self-pay | Admitting: Hematology and Oncology

## 2024-10-12 ENCOUNTER — Inpatient Hospital Stay: Admitting: Hematology and Oncology

## 2024-10-12 VITALS — BP 121/65 | HR 59 | Resp 18 | Ht 71.0 in | Wt 250.6 lb

## 2024-10-12 DIAGNOSIS — Z87891 Personal history of nicotine dependence: Secondary | ICD-10-CM | POA: Diagnosis not present

## 2024-10-12 DIAGNOSIS — D649 Anemia, unspecified: Secondary | ICD-10-CM | POA: Diagnosis not present

## 2024-10-12 DIAGNOSIS — Z79899 Other long term (current) drug therapy: Secondary | ICD-10-CM | POA: Insufficient documentation

## 2024-10-12 DIAGNOSIS — Z8042 Family history of malignant neoplasm of prostate: Secondary | ICD-10-CM | POA: Diagnosis not present

## 2024-10-12 DIAGNOSIS — D509 Iron deficiency anemia, unspecified: Secondary | ICD-10-CM

## 2024-10-12 DIAGNOSIS — Z800 Family history of malignant neoplasm of digestive organs: Secondary | ICD-10-CM | POA: Diagnosis not present

## 2024-10-12 LAB — CMP (CANCER CENTER ONLY)
ALT: 33 U/L (ref 0–44)
AST: 30 U/L (ref 15–41)
Albumin: 4.4 g/dL (ref 3.5–5.0)
Alkaline Phosphatase: 84 U/L (ref 38–126)
Anion gap: 10 (ref 5–15)
BUN: 15 mg/dL (ref 8–23)
CO2: 25 mmol/L (ref 22–32)
Calcium: 9.6 mg/dL (ref 8.9–10.3)
Chloride: 105 mmol/L (ref 98–111)
Creatinine: 1.26 mg/dL — ABNORMAL HIGH (ref 0.61–1.24)
GFR, Estimated: >60 mL/min (ref >60)
Glucose, Bld: 125 mg/dL — ABNORMAL HIGH (ref 70–99)
Potassium: 4.1 mmol/L (ref 3.5–5.1)
Sodium: 140 mmol/L (ref 135–145)
Total Bilirubin: 1 mg/dL (ref 0.0–1.2)
Total Protein: 7 g/dL (ref 6.5–8.1)

## 2024-10-12 LAB — CBC WITH DIFFERENTIAL (CANCER CENTER ONLY)
Abs Immature Granulocytes: 0.01 K/uL (ref 0.00–0.07)
Basophils Absolute: 0.1 K/uL (ref 0.0–0.1)
Basophils Relative: 1 %
Eosinophils Absolute: 0.2 K/uL (ref 0.0–0.5)
Eosinophils Relative: 2 %
HCT: 38.4 % — ABNORMAL LOW (ref 39.0–52.0)
Hemoglobin: 11.1 g/dL — ABNORMAL LOW (ref 13.0–17.0)
Immature Granulocytes: 0 %
Lymphocytes Relative: 19 %
Lymphs Abs: 1.4 K/uL (ref 0.7–4.0)
MCH: 21 pg — ABNORMAL LOW (ref 26.0–34.0)
MCHC: 28.9 g/dL — ABNORMAL LOW (ref 30.0–36.0)
MCV: 72.6 fL — ABNORMAL LOW (ref 80.0–100.0)
Monocytes Absolute: 0.7 K/uL (ref 0.1–1.0)
Monocytes Relative: 10 %
Neutro Abs: 5.1 K/uL (ref 1.7–7.7)
Neutrophils Relative %: 68 %
Platelet Count: 313 K/uL (ref 150–400)
RBC: 5.29 MIL/uL (ref 4.22–5.81)
RDW: 20.8 % — ABNORMAL HIGH (ref 11.5–15.5)
WBC Count: 7.5 K/uL (ref 4.0–10.5)
nRBC: 0 % (ref 0.0–0.2)

## 2024-10-12 LAB — FERRITIN: Ferritin: 12 ng/mL — ABNORMAL LOW (ref 24–336)

## 2024-10-12 LAB — IRON AND TIBC
Iron: 39 ug/dL — ABNORMAL LOW (ref 45–182)
Saturation Ratios: 9 % — ABNORMAL LOW (ref 17.9–39.5)
TIBC: 449 ug/dL (ref 250–450)
UIBC: 411 ug/dL

## 2024-10-12 LAB — VITAMIN B12: Vitamin B-12: 812 pg/mL (ref 180–914)

## 2024-10-12 LAB — FOLATE: Folate: >20 ng/mL (ref >5.9)

## 2024-10-12 LAB — TSH: TSH: 2.02 u[IU]/mL (ref 0.350–4.500)

## 2024-10-12 NOTE — Progress Notes (Signed)
 [facility] [address] [place],  [place]  [address] [phone]  Clinic Day:  10/12/2024   Referring physician: Shepard Ade, MD  Patient Care Team: Patient Care Team: Shepard Ade, MD as PCP - General (Internal Medicine) Nahser, Aleene PARAS, MD (Inactive) as PCP - Cardiology (Cardiology) Arlana Arnt, MD as Consulting Physician (Otolaryngology)   REASON FOR CONSULTATION:  Anemia HISTORY OF PRESENT ILLNESS:   Matthew Love is a 73 y.o. male with a history of anemia who is referred in consultation by Ade Shepard, MD for assessment and management. He was noted to be anemic in October of this year after being admitted for GI bleed. He was managed for a hemoglobin of 7 with 2 units of PRBC. He was then started on oral iron  and appears to be tolerating well. He denies fever, chills, nausea or vomiting. He denies shortness of breath, chest pain or cough. He denies issue with bowel or bladder. He denies changes in appetite or weight. Medical, surgical and family history as below.    REVIEW OF SYSTEMS:   Review of Systems  Constitutional: Negative.   HENT:  Negative.    Eyes: Negative.   Respiratory: Negative.    Cardiovascular: Negative.   Gastrointestinal: Negative.   Endocrine: Negative.   Genitourinary: Negative.    Musculoskeletal: Negative.   Skin: Negative.   Neurological: Negative.   Hematological: Negative.   Psychiatric/Behavioral: Negative.       VITALS:   There were no vitals taken for this visit.  Wt Readings from Last 3 Encounters:  09/01/24 243 lb 6 oz (110.4 kg)  08/02/24 230 lb (104.3 kg)  06/02/24 240 lb (108.9 kg)    There is no height or weight on file to calculate BMI.  Performance status (ECOG): 1 - Symptomatic but completely ambulatory  PHYSICAL EXAM:   Physical Exam Constitutional:      Appearance: Normal appearance.  HENT:     Head: Normocephalic and atraumatic.     Mouth/Throat:     Mouth: Mucous  membranes are moist.  Cardiovascular:     Rate and Rhythm: Normal rate and regular rhythm.     Pulses: Normal pulses.     Heart sounds: Normal heart sounds.  Pulmonary:     Effort: Pulmonary effort is normal.     Breath sounds: Normal breath sounds.  Abdominal:     General: Bowel sounds are normal.     Palpations: Abdomen is soft.  Musculoskeletal:        General: Normal range of motion.     Cervical back: Normal range of motion.  Skin:    General: Skin is warm and dry.  Neurological:     General: No focal deficit present.     Mental Status: He is alert and oriented to person, place, and time. Mental status is at baseline.  Psychiatric:        Mood and Affect: Mood normal.        Behavior: Behavior normal.        Thought Content: Thought content normal.        Judgment: Judgment normal.      LABS:      Latest Ref Rng & Units 09/29/2024    8:46 AM 09/01/2024    4:04 PM 08/03/2024    4:26 AM  CBC  WBC 4.0 - 10.5 K/uL 6.2  6.5  8.4   Hemoglobin 13.0 - 17.0 g/dL 89.3  9.1  8.7   Hematocrit 39.0 -  52.0 % 34.8  29.7  29.2   Platelets 150.0 - 400.0 K/uL 338.0  340.0  324       Latest Ref Rng & Units 09/01/2024    4:04 PM 08/03/2024    4:26 AM 08/02/2024    3:18 AM  CMP  Glucose 70 - 99 mg/dL 91  891  886   BUN 6 - 23 mg/dL 16  11  18    Creatinine 0.40 - 1.50 mg/dL 8.78  8.89  8.69   Sodium 135 - 145 mEq/L 144  141  140   Potassium 3.5 - 5.1 mEq/L 3.9  4.0  3.7   Chloride 96 - 112 mEq/L 108  108  106   CO2 19 - 32 mEq/L 19  20  27    Calcium  8.4 - 10.5 mg/dL 9.4  8.7  8.9   Total Protein 6.5 - 8.1 g/dL   5.9   Total Bilirubin 0.0 - 1.2 mg/dL   2.3   Alkaline Phos 38 - 126 U/L   54   AST 15 - 41 U/L   20   ALT 0 - 44 U/L   23      No results found for: CEA1, CEA / No results found for: CEA1, CEA No results found for: PSA1 No results found for: CAN199 No results found for: CAN125  No results found for: TOTALPROTELP, ALBUMINELP, A1GS, A2GS, BETS,  BETA2SER, GAMS, MSPIKE, SPEI Lab Results  Component Value Date   TIBC 464.8 (H) 09/29/2024   TIBC 471.8 (H) 09/01/2024   TIBC 424 08/01/2024   FERRITIN 7.3 (L) 09/29/2024   FERRITIN 5.3 (L) 09/01/2024   FERRITIN 3 (L) 08/01/2024   IRONPCTSAT 23.0 09/29/2024   IRONPCTSAT 4.7 (L) 09/01/2024   IRONPCTSAT 5 (L) 08/01/2024   No results found for: LDH  STUDIES:   No results found.    HISTORY:   Past Medical History:  Diagnosis Date   Anxiety    Arthritis    Coronary artery disease    a. NST 08/2016: high-risk w/ inferior and lateral wall ischemia  b. cath 09/2016: 80% mid-LAD and 90% distal LAD stenosis. Placement of 2 DES. Mild nonobstructive dz along RCA and LCx.   Diabetes mellitus    A1C 6 weeks ago as of 09/08/14 was 6.2. Not on oral meds.   GERD (gastroesophageal reflux disease)    H/O cardiovascular stress test    ETT-Myoview  11/11: Inferior fixed defect likely due to diaphragmatic attenuation with normal wall motion, EF 69%   H/O echocardiogram    Echo 11/11: EF 60-65%, grade 1 diastolic dysfunction, mild LAE   H/O hiatal hernia    Hyperlipidemia    Hypertension    Mini stroke    Mini stroke    Obesity    Pneumonia    hx   Sleep apnea    cpap 1 yr   Stroke [facility]) 2010    Past Surgical History:  Procedure Laterality Date   BUNIONECTOMY     CARDIAC CATHETERIZATION N/A 09/06/2016   Procedure: Left Heart Cath and Coronary Angiography;  Surgeon: Lonni JONETTA Cash, MD;  Location: [facility] INVASIVE CV LAB;  Service: Cardiovascular;  Laterality: N/A;   CARDIAC CATHETERIZATION N/A 09/06/2016   Procedure: Coronary Stent Intervention;  Surgeon: Lonni JONETTA Cash, MD;  Location: [facility] INVASIVE CV LAB;  Service: Cardiovascular;  Laterality: N/A;   COLONOSCOPY  2011   COLONOSCOPY N/A 08/03/2024   Procedure: COLONOSCOPY;  Surgeon: Suzann Inocente HERO, MD;  Location: [facility] ENDOSCOPY;  Service: Gastroenterology;  Laterality: N/A;   CORONARY STENT PLACEMENT  09/06/2016    Severe stenosis mid LAD, s/p successful PTCA/DES x 1   ESOPHAGOGASTRODUODENOSCOPY N/A 08/02/2024   Procedure: EGD (ESOPHAGOGASTRODUODENOSCOPY);  Surgeon: Rollin Dover, MD;  Location: [facility] ENDOSCOPY;  Service: Gastroenterology;  Laterality: N/A;   FOOT SURGERY Left    bunion   Hydrocell Removal     KNEE SURGERY Left    arthroscopy   NASAL SEPTOPLASTY W/ TURBINOPLASTY N/A 09/08/2014   Procedure: NASAL SEPTOPLASTY WITH TURBINATE REDUCTION;  Surgeon: Marlyce Finer, MD;  Location: [facility] OR;  Service: ENT;  Laterality: N/A;   TONSILLECTOMY      Family History  Problem Relation Age of Onset   Prostate cancer Father    Colon cancer Father    Heart attack Mother    Heart disease Other        paternal grandparents   Alzheimer's disease Maternal Grandmother    Stomach cancer Neg Hx     Social History:  reports that he quit smoking about 39 years ago. His smoking use included cigarettes. He started smoking about 59 years ago. He has a 70 pack-year smoking history. He has never used smokeless tobacco. He reports that he does not drink alcohol  and does not use drugs.The patient is accompanied by wife today.  Allergies:  Allergies  Allergen Reactions   Bee Venom Anaphylaxis, Itching and Swelling    Other Reaction(s): Not available   Oxycodone -Acetaminophen      Unknown reaction   Hydrocodone-Acetaminophen  Rash    Current Medications: Current Outpatient Medications  Medication Sig Dispense Refill   allopurinol  (ZYLOPRIM ) 300 MG tablet Take 300 mg by mouth daily.     atorvastatin  (LIPITOR) 40 MG tablet TAKE ONE TABLET BY MOUTH ONCE DAILY AT  6  PM 30 tablet 6   EPINEPHrine  (EPIPEN ) 0.3 mg/0.3 mL SOAJ injection Inject 0.3 mLs (0.3 mg total) into the muscle once. For signs or symptoms of anaphylaxis. 1 Device 0   escitalopram  (LEXAPRO ) 10 MG tablet Take 10 mg by mouth daily.      iron  polysaccharides (FERREX 150) 150 MG capsule Take 1 capsule by mouth twice daily 60 capsule 3   metoprolol  tartrate  (LOPRESSOR ) 25 MG tablet Take 1/2 (one-half) tablet by mouth twice daily 90 tablet 3   Multiple Vitamin (MULTIVITAMIN) tablet Take 1 tablet by mouth daily.     nitroGLYCERIN  (NITROSTAT ) 0.4 MG SL tablet Place 1 tablet (0.4 mg total) under the tongue every 5 (five) minutes as needed for chest pain. Make sure you are sitting when you take this medication. Do not take more than 3 doses in 15 minutes. If you are still having chest pain after 3 doses, then call 911. 25 tablet 1   OMEGA 3 1200 MG CAPS Take 1,200 mg by mouth daily.     pantoprazole  (PROTONIX ) 40 MG tablet Take 40 mg by mouth 2 (two) times daily.     rivaroxaban  (XARELTO ) 20 MG TABS tablet Take 1 tablet (20 mg total) by mouth daily with supper. 90 tablet 1   spironolactone  (ALDACTONE ) 25 MG tablet Take 1 tablet (25 mg total) by mouth daily. 90 tablet 3   No current facility-administered medications for this visit.     ASSESSMENT & PLAN:   Assessment:  QUINCY BOY is a 73 y.o. male with history of anemia after admission and treatment for GI bleed. He was treated inpatient with PRBC and started on oral iron . Repeat iron  studies on 10/29 reveal iron   saturation improved from 4.7 to 23.0. Ferritin improved from 5.3 to 7.3. He remains asymptomatic and is tolerating oral iron  well. We discussed that he has had a good response to iron  as his hemoglobin is improved to 11.3 from 10.6 today. Iron  studies are pending and I will contact him with results. If he continues to respond and tolerate oral iron , we will keep him on that. If it seems that he may be better served with iron  infusions, we have discussed that as well and he is agreeable.   Plan: 1.  Return to clinic in 3 months.   I discussed the assessment and treatment plan with the patient.  The patient was provided an opportunity to ask questions and all were answered.  The patient agreed with the plan and demonstrated an understanding of the instructions.    Thank you for the referral     45 minutes was spent in patient care.  This included time spent preparing to see the patient (e.g., review of tests), obtaining and/or reviewing separately obtained history, counseling and educating the patient/family/caregiver, ordering medications, tests, or procedures; documenting clinical information in the electronic or other health record, independently interpreting results and communicating results to the patient/family/caregiver as well as coordination of care.      Eleanor DELENA Bach, NP   Family Nurse Practitioner - Board Certified [facility] [phone]

## 2024-10-13 ENCOUNTER — Telehealth: Payer: Self-pay | Admitting: Hematology and Oncology

## 2024-10-13 NOTE — Telephone Encounter (Signed)
 Patient has been scheduled for follow-up visit per 10/12/24 LOS.  Pt aware of scheduled appt details.

## 2024-10-16 LAB — MULTIPLE MYELOMA PANEL, SERUM
Albumin SerPl Elph-Mcnc: 3.5 g/dL (ref 2.9–4.4)
Albumin/Glob SerPl: 1.2 (ref 0.7–1.7)
Alpha 1: 0.3 g/dL (ref 0.0–0.4)
Alpha2 Glob SerPl Elph-Mcnc: 0.9 g/dL (ref 0.4–1.0)
B-Globulin SerPl Elph-Mcnc: 1 g/dL (ref 0.7–1.3)
Gamma Glob SerPl Elph-Mcnc: 0.8 g/dL (ref 0.4–1.8)
Globulin, Total: 3 g/dL (ref 2.2–3.9)
IgA: 211 mg/dL (ref 61–437)
IgG (Immunoglobin G), Serum: 773 mg/dL (ref 603–1613)
IgM (Immunoglobulin M), Srm: 93 mg/dL (ref 15–143)
Total Protein ELP: 6.5 g/dL (ref 6.0–8.5)

## 2024-10-18 ENCOUNTER — Telehealth: Payer: Self-pay | Admitting: Cardiovascular Disease

## 2024-10-18 NOTE — Telephone Encounter (Signed)
 Patient would like to do a provider switch from Dr Francyne to Dr Victor please advise

## 2024-12-13 ENCOUNTER — Other Ambulatory Visit: Payer: Self-pay

## 2024-12-13 DIAGNOSIS — E782 Mixed hyperlipidemia: Secondary | ICD-10-CM

## 2024-12-13 DIAGNOSIS — I10 Essential (primary) hypertension: Secondary | ICD-10-CM

## 2024-12-13 DIAGNOSIS — I480 Paroxysmal atrial fibrillation: Secondary | ICD-10-CM

## 2024-12-13 DIAGNOSIS — Z79899 Other long term (current) drug therapy: Secondary | ICD-10-CM

## 2024-12-13 DIAGNOSIS — I5032 Chronic diastolic (congestive) heart failure: Secondary | ICD-10-CM

## 2024-12-14 MED ORDER — SPIRONOLACTONE 25 MG PO TABS: 25.0000 mg | ORAL_TABLET | Freq: Every day | ORAL | 1 refills | Status: AC

## 2024-12-23 ENCOUNTER — Encounter: Payer: Self-pay | Admitting: Cardiovascular Disease

## 2025-01-12 ENCOUNTER — Inpatient Hospital Stay: Payer: Self-pay | Admitting: Hematology and Oncology

## 2025-01-12 ENCOUNTER — Inpatient Hospital Stay: Payer: Self-pay | Attending: Hematology and Oncology
